# Patient Record
Sex: Female | Born: 1960
Health system: Southern US, Community
[De-identification: ages and names within clinical notes are randomized; demographics above are authoritative.]

## PROBLEM LIST (undated history)

## (undated) DIAGNOSIS — F419 Anxiety disorder, unspecified: Secondary | ICD-10-CM

## (undated) DIAGNOSIS — T7840XA Allergy, unspecified, initial encounter: Secondary | ICD-10-CM

## (undated) DIAGNOSIS — L509 Urticaria, unspecified: Secondary | ICD-10-CM

## (undated) DIAGNOSIS — F329 Major depressive disorder, single episode, unspecified: Secondary | ICD-10-CM

## (undated) DIAGNOSIS — G4730 Sleep apnea, unspecified: Secondary | ICD-10-CM

## (undated) DIAGNOSIS — D219 Benign neoplasm of connective and other soft tissue, unspecified: Secondary | ICD-10-CM

## (undated) DIAGNOSIS — F32A Depression, unspecified: Secondary | ICD-10-CM

## (undated) DIAGNOSIS — I10 Essential (primary) hypertension: Secondary | ICD-10-CM

## (undated) DIAGNOSIS — R011 Cardiac murmur, unspecified: Secondary | ICD-10-CM

## (undated) HISTORY — DX: Major depressive disorder, single episode, unspecified: F32.9

## (undated) HISTORY — DX: Urticaria, unspecified: L50.9

## (undated) HISTORY — DX: Sleep apnea, unspecified: G47.30

## (undated) HISTORY — DX: Allergy, unspecified, initial encounter: T78.40XA

## (undated) HISTORY — DX: Cardiac murmur, unspecified: R01.1

## (undated) HISTORY — DX: Depression, unspecified: F32.A

---

## 1990-05-06 HISTORY — PX: MYOMECTOMY: SHX85

## 1998-05-06 DIAGNOSIS — Z5189 Encounter for other specified aftercare: Secondary | ICD-10-CM

## 1998-05-06 HISTORY — DX: Encounter for other specified aftercare: Z51.89

## 1998-06-21 ENCOUNTER — Ambulatory Visit (HOSPITAL_COMMUNITY): Admission: RE | Admit: 1998-06-21 | Discharge: 1998-06-21 | Payer: Self-pay | Admitting: Obstetrics and Gynecology

## 1998-08-11 ENCOUNTER — Inpatient Hospital Stay (HOSPITAL_COMMUNITY): Admission: AD | Admit: 1998-08-11 | Discharge: 1998-08-11 | Payer: Self-pay | Admitting: Obstetrics and Gynecology

## 1998-08-15 ENCOUNTER — Inpatient Hospital Stay (HOSPITAL_COMMUNITY): Admission: AD | Admit: 1998-08-15 | Discharge: 1998-08-15 | Payer: Self-pay | Admitting: Obstetrics and Gynecology

## 1998-08-18 ENCOUNTER — Inpatient Hospital Stay (HOSPITAL_COMMUNITY): Admission: AD | Admit: 1998-08-18 | Discharge: 1998-08-18 | Payer: Self-pay | Admitting: *Deleted

## 1998-08-28 ENCOUNTER — Inpatient Hospital Stay (HOSPITAL_COMMUNITY): Admission: AD | Admit: 1998-08-28 | Discharge: 1998-08-28 | Payer: Self-pay | Admitting: Obstetrics & Gynecology

## 1998-09-12 ENCOUNTER — Inpatient Hospital Stay (HOSPITAL_COMMUNITY): Admission: AD | Admit: 1998-09-12 | Discharge: 1998-09-12 | Payer: Self-pay | Admitting: Obstetrics and Gynecology

## 1998-10-04 ENCOUNTER — Inpatient Hospital Stay (HOSPITAL_COMMUNITY): Admission: AD | Admit: 1998-10-04 | Discharge: 1998-10-08 | Payer: Self-pay | Admitting: Obstetrics and Gynecology

## 1998-12-05 ENCOUNTER — Ambulatory Visit (HOSPITAL_COMMUNITY): Admission: RE | Admit: 1998-12-05 | Discharge: 1998-12-05 | Payer: Self-pay | Admitting: Obstetrics and Gynecology

## 1998-12-05 ENCOUNTER — Encounter: Payer: Self-pay | Admitting: Obstetrics and Gynecology

## 1999-01-16 ENCOUNTER — Other Ambulatory Visit: Admission: RE | Admit: 1999-01-16 | Discharge: 1999-01-16 | Payer: Self-pay | Admitting: Obstetrics and Gynecology

## 1999-05-11 ENCOUNTER — Ambulatory Visit (HOSPITAL_COMMUNITY): Admission: RE | Admit: 1999-05-11 | Discharge: 1999-05-11 | Payer: Self-pay | Admitting: Obstetrics and Gynecology

## 1999-07-18 ENCOUNTER — Encounter: Payer: Self-pay | Admitting: Obstetrics and Gynecology

## 1999-07-18 ENCOUNTER — Encounter: Admission: RE | Admit: 1999-07-18 | Discharge: 1999-07-18 | Payer: Self-pay | Admitting: Obstetrics and Gynecology

## 2000-04-07 ENCOUNTER — Other Ambulatory Visit: Admission: RE | Admit: 2000-04-07 | Discharge: 2000-04-07 | Payer: Self-pay | Admitting: Obstetrics and Gynecology

## 2000-10-20 ENCOUNTER — Other Ambulatory Visit: Admission: RE | Admit: 2000-10-20 | Discharge: 2000-10-20 | Payer: Self-pay | Admitting: Obstetrics and Gynecology

## 2001-04-16 ENCOUNTER — Other Ambulatory Visit: Admission: RE | Admit: 2001-04-16 | Discharge: 2001-04-16 | Payer: Self-pay | Admitting: Obstetrics and Gynecology

## 2002-05-09 ENCOUNTER — Inpatient Hospital Stay (HOSPITAL_COMMUNITY): Admission: EM | Admit: 2002-05-09 | Discharge: 2002-05-15 | Payer: Self-pay | Admitting: Emergency Medicine

## 2002-05-09 ENCOUNTER — Encounter: Payer: Self-pay | Admitting: Emergency Medicine

## 2002-05-14 ENCOUNTER — Encounter: Payer: Self-pay | Admitting: Internal Medicine

## 2002-06-03 ENCOUNTER — Encounter: Admission: RE | Admit: 2002-06-03 | Discharge: 2002-06-03 | Payer: Self-pay | Admitting: Internal Medicine

## 2002-06-03 ENCOUNTER — Encounter: Payer: Self-pay | Admitting: Internal Medicine

## 2002-10-20 ENCOUNTER — Other Ambulatory Visit: Admission: RE | Admit: 2002-10-20 | Discharge: 2002-10-20 | Payer: Self-pay | Admitting: Obstetrics and Gynecology

## 2003-12-19 ENCOUNTER — Other Ambulatory Visit: Admission: RE | Admit: 2003-12-19 | Discharge: 2003-12-19 | Payer: Self-pay | Admitting: Obstetrics and Gynecology

## 2004-12-25 ENCOUNTER — Other Ambulatory Visit: Admission: RE | Admit: 2004-12-25 | Discharge: 2004-12-25 | Payer: Self-pay | Admitting: Obstetrics and Gynecology

## 2006-10-12 ENCOUNTER — Encounter: Admission: RE | Admit: 2006-10-12 | Discharge: 2006-10-12 | Payer: Self-pay | Admitting: Orthopedic Surgery

## 2007-06-24 ENCOUNTER — Encounter: Admission: RE | Admit: 2007-06-24 | Discharge: 2007-06-24 | Payer: Self-pay | Admitting: Internal Medicine

## 2007-07-04 ENCOUNTER — Emergency Department (HOSPITAL_COMMUNITY): Admission: EM | Admit: 2007-07-04 | Discharge: 2007-07-04 | Payer: Self-pay | Admitting: Emergency Medicine

## 2007-11-16 ENCOUNTER — Encounter: Admission: RE | Admit: 2007-11-16 | Discharge: 2007-11-16 | Payer: Self-pay | Admitting: Internal Medicine

## 2008-08-05 ENCOUNTER — Encounter: Admission: RE | Admit: 2008-08-05 | Discharge: 2008-08-05 | Payer: Self-pay | Admitting: Family Medicine

## 2010-05-26 ENCOUNTER — Encounter: Payer: Self-pay | Admitting: Family Medicine

## 2010-05-27 ENCOUNTER — Encounter: Payer: Self-pay | Admitting: Internal Medicine

## 2010-05-27 ENCOUNTER — Encounter: Payer: Self-pay | Admitting: Obstetrics and Gynecology

## 2010-09-21 NOTE — Discharge Summary (Signed)
   NAME:  Ariana Cohen, Ariana Cohen                         ACCOUNT NO.:  [id number]   MEDICAL RECORD NO.:  [id number]                   PATIENT TYPE:  INP   LOCATION:  5737                                 FACILITY:  [facility]   PHYSICIAN:  Merlene Laughter. Renae Gloss, M.D.           DATE OF BIRTH:  [birth year]   DATE OF ADMISSION:  05/08/2002  DATE OF DISCHARGE:  05/15/2002                                 DISCHARGE SUMMARY   DISCHARGE DIAGNOSES:  1. Right lower lobe pneumonia.  2. Hypokalemia.  3. Hypertension.   HISTORY OF PRESENT ILLNESS:  The patient was admitted with significant  shortness of breath and productive cough.  She failed several rounds of  outpatient antibiotic therapies.  Her initial chest x-ray showed a right  lower lobe consolidation with bronchitic changes.  She was also noted to  have a white count of 40.4.   HOSPITAL COURSE:  She was placed on IV Rocephin and Zithromax along with  albuterol and Atrovent nebulizers.  She was also given chest physiotherapy.  During the course of this admission, her respiratory status improved.  She  continued, however, to be significantly tachypneic until close to the end of  her hospital course.  She was dyspneic with exertion, but had O2 saturations  on room air in the high 90s.   Other problems during this admission included hypokalemia.  She was given  potassium supplementation and her discharge potassium is 3.9.   DISCHARGE MEDICATIONS:  1. Albuterol nebulizer q.4h.  2. Norvasc 10 mg p.o. daily.  3. Atrovent nebulizer q.4h.  4. Protonix 40 mg p.o. daily.  5. Zithromax 500 mg p.o. daily.  6. Humibid LA one p.o. b.i.d.  7. Potassium 40 mEq p.o. daily.  8. Ceftin 500 mg p.o. b.i.d.  9. Tussionex one teaspoon p.o. q.12h. p.r.n. for cough.  10.      Tylenol p.r.n.   FOLLOW-UP:  The patient will be seen by Merlene Laughter. Renae Gloss, M.D., on  Friday, May 21, 2002, at 10 a.m. for follow-up assessment of right lower  lobe pneumonia.                                          Merlene Laughter. Renae Gloss, M.D.    KRS/MEDQ  D:  05/15/2002  T:  05/16/2002  Job:  [id number]

## 2010-09-21 NOTE — Op Note (Signed)
[facility]  Patient:    Ariana Cohen                       MRN: [id number] Proc. Date: 05/11/99 Adm. Date:  [id number] Attending:  Dierdre Forth Pearline                           Operative Report  PREOPERATIVE DIAGNOSIS:       Desire for surgical sterilization, status post left tubal ligation at cesarean section with inability to access the right fallopian  tube.  POSTOPERATIVE DIAGNOSIS:      Desire for surgical sterilization, status post left tubal ligation at cesarean section with inability to access the right fallopian  tube. Plus significant pelvic adhesions.  OPERATION:                    Operative laparoscopy, lysis of adhesions, cauterization of the right fallopian tube for sterilization.  SURGEON:                      Vanessa P. Haygood, M.D.  ASSISTANT:  ANESTHESIA:                   General orotracheal.  ESTIMATED BLOOD LOSS:         Less than 25 cc.  COMPLICATIONS:                None.  FINDINGS:                     The uterus was upper limits of normal size with a 2 cm myoma on the left posterior fundus.  The left fallopian tube was status post  interuption for sterilization.  The left ovary was normal size, but encased in filmy adhesions.  The right fallopian tube was encased in adhesions and adherent to the right ovary which was likewise encased in filmy adhesions.  The fimbriated nd could be identified as could the end joining the uterus.  There were filmy adhesions from the anterior uterus to the anterior abdominal wall.  DESCRIPTION OF PROCEDURE:     The patient was taken to the operating room after  appropriate identification and placed on the operating table.  After the attainment of adequate general anesthesia, she was placed in the modified lithotomy position. The abdomen, perineum, and vagina were prepped with multiple layers of Betadine and draped as a sterile field.  A red Robinson catheter was used  to empty the bladder. A single tooth tenaculum was placed on the anterior cervix.  The periumbilical rea was infiltrated with 6 cc of 0.25% Marcaine and the suprapubic regions on the right and left side of the midline were infiltrated with 2 cc of 0.25% Marcaine on either side.  A periumbilical incision was made and the Veress cannula placed through hat incision into the peritoneal cavity.  The pneumoperitoneum was created with 3.5  liters of CO2.  The Veress cannula was removed and the laparoscopic trocar placed through that incision.  The laparoscope was placed through the trocar sleeve and the above noted findings made.  The suprapubic incision was made to the left of  midline and the laparoscopic probe trocar placed through that incision into the  peritoneal cavity under direct visualization.  A combination of blunt and sharp  dissection was used to free the right fallopian tube from its encasement in adhesions and  to free it in the isthmic portion to allow cautery.  Hemostasis was achieved with cautery.  The isthmic portion of the fallopian tube was then identified and its connection with the fundal portion as well as the fimbriated end documented.  That isthmic portion was elevated and cauterized in three adjacent  areas and then cut.  Hemostasis was noted to be adequate and copious irrigation was carried out.  All instruments were removed from the peritoneal cavity under direct visualization as the CO2 was allowed to escape.  The subumbilical and suprapubic incisions were closed with subcuticular sutures of 3-0 Vicryl.  Sterile dressings were applied and the single tooth tenaculum removed.  The patient was awakened rom general anesthesia and taken to the recovery room in satisfactory condition having tolerated the procedure well with sponge, needle, and instrument counts were correct. DD:  05/11/99 TD:  05/12/99 Job: [id number]

## 2013-06-04 ENCOUNTER — Other Ambulatory Visit: Payer: Self-pay | Admitting: Obstetrics and Gynecology

## 2013-06-04 DIAGNOSIS — Z1231 Encounter for screening mammogram for malignant neoplasm of breast: Secondary | ICD-10-CM

## 2013-06-15 ENCOUNTER — Other Ambulatory Visit: Payer: Self-pay | Admitting: Obstetrics and Gynecology

## 2013-06-15 DIAGNOSIS — D259 Leiomyoma of uterus, unspecified: Secondary | ICD-10-CM

## 2013-06-22 ENCOUNTER — Inpatient Hospital Stay
Admission: RE | Admit: 2013-06-22 | Discharge: 2013-06-22 | Disposition: A | Discharge status: Home / Self Care | Payer: Self-pay | Source: Ambulatory Visit | Attending: Obstetrics and Gynecology | Admitting: Obstetrics and Gynecology

## 2013-06-25 ENCOUNTER — Ambulatory Visit
Admission: RE | Admit: 2013-06-25 | Discharge: 2013-06-25 | Disposition: A | Discharge status: Home / Self Care | Payer: BC Managed Care – PPO | Source: Ambulatory Visit | Attending: Obstetrics and Gynecology | Admitting: Obstetrics and Gynecology

## 2013-06-25 DIAGNOSIS — Z1231 Encounter for screening mammogram for malignant neoplasm of breast: Secondary | ICD-10-CM

## 2013-07-08 ENCOUNTER — Ambulatory Visit
Admission: RE | Admit: 2013-07-08 | Discharge: 2013-07-08 | Disposition: A | Discharge status: Home / Self Care | Payer: BC Managed Care – PPO | Source: Ambulatory Visit | Attending: Obstetrics and Gynecology | Admitting: Obstetrics and Gynecology

## 2013-07-08 DIAGNOSIS — D259 Leiomyoma of uterus, unspecified: Secondary | ICD-10-CM

## 2013-07-08 HISTORY — DX: Benign neoplasm of connective and other soft tissue, unspecified: D21.9

## 2013-07-08 HISTORY — DX: Essential (primary) hypertension: I10

## 2013-09-17 ENCOUNTER — Telehealth: Payer: Self-pay | Admitting: Radiology

## 2013-09-17 NOTE — Telephone Encounter (Signed)
Patient called to request app't for pre [place] MR.  Will obtain order from Dr Leo Grosser & call back to schedule app't.  Sian Rockers Riki Rusk, RN 09/17/2013 2:42 PM

## 2013-09-29 ENCOUNTER — Other Ambulatory Visit: Payer: Self-pay | Admitting: Obstetrics and Gynecology

## 2013-09-29 DIAGNOSIS — N920 Excessive and frequent menstruation with regular cycle: Secondary | ICD-10-CM

## 2013-09-29 DIAGNOSIS — D259 Leiomyoma of uterus, unspecified: Secondary | ICD-10-CM

## 2013-10-14 ENCOUNTER — Ambulatory Visit
Admission: RE | Admit: 2013-10-14 | Discharge: 2013-10-14 | Disposition: A | Discharge status: Home / Self Care | Payer: BC Managed Care – PPO | Source: Ambulatory Visit | Attending: Obstetrics and Gynecology | Admitting: Obstetrics and Gynecology

## 2013-10-14 DIAGNOSIS — D259 Leiomyoma of uterus, unspecified: Secondary | ICD-10-CM

## 2013-10-14 DIAGNOSIS — N920 Excessive and frequent menstruation with regular cycle: Secondary | ICD-10-CM

## 2013-10-14 MED ORDER — GADOBENATE DIMEGLUMINE 529 MG/ML IV SOLN
19.0000 mL | Freq: Once | INTRAVENOUS | Status: AC | PRN
  Administered 2013-10-14: 19 mL via INTRAVENOUS

## 2013-10-15 ENCOUNTER — Telehealth: Payer: Self-pay | Admitting: Emergency Medicine

## 2013-10-15 NOTE — Telephone Encounter (Signed)
CALLED PT TO MAKE HER AWARE THAT DR DDH REVIEWED HER MRI AND SHE IS STILL A GOOD CANDIDATE FOR [place].  I WILL GO AHEAD AND FILE W/ INS. WHILE PT WILL CONTACT THEM FOR BENEFITS AND ELIGIBILITY AND PRICE. WILL CALL PT BACK AFTER WE HEAR FROM [employer]. AUTHO.

## 2013-10-25 ENCOUNTER — Telehealth: Payer: Self-pay | Admitting: Emergency Medicine

## 2013-10-25 NOTE — Telephone Encounter (Signed)
LMOVM FOR PT TO MAKE HER AWARE THAT NO INS. AUTHO NEEDED FOR [place] PROCEDURE.  JUST CALL us WHEN READY TO SCHEDULE PROCEDURE.  DO HAVE REF. # [id number] FOR CKING ON CPT CODES FOR [place] PROCEDURE.

## 2015-06-02 ENCOUNTER — Other Ambulatory Visit: Payer: Self-pay | Admitting: Internal Medicine

## 2015-06-02 DIAGNOSIS — Z1231 Encounter for screening mammogram for malignant neoplasm of breast: Secondary | ICD-10-CM

## 2015-06-28 ENCOUNTER — Ambulatory Visit: Payer: Self-pay

## 2015-08-08 ENCOUNTER — Ambulatory Visit: Payer: Self-pay | Admitting: Internal Medicine

## 2015-08-11 ENCOUNTER — Encounter: Payer: Self-pay | Admitting: Internal Medicine

## 2015-08-11 ENCOUNTER — Ambulatory Visit (INDEPENDENT_AMBULATORY_CARE_PROVIDER_SITE_OTHER): Payer: 59 | Admitting: Internal Medicine

## 2015-08-11 VITALS — BP 170/90 | HR 72 | Ht 66.0 in | Wt 212.2 lb

## 2015-08-11 DIAGNOSIS — R011 Cardiac murmur, unspecified: Secondary | ICD-10-CM | POA: Diagnosis not present

## 2015-08-11 DIAGNOSIS — R0600 Dyspnea, unspecified: Secondary | ICD-10-CM

## 2015-08-11 DIAGNOSIS — R0609 Other forms of dyspnea: Secondary | ICD-10-CM | POA: Diagnosis not present

## 2015-08-11 DIAGNOSIS — R0602 Shortness of breath: Secondary | ICD-10-CM | POA: Insufficient documentation

## 2015-08-11 DIAGNOSIS — R5383 Other fatigue: Secondary | ICD-10-CM | POA: Diagnosis not present

## 2015-08-11 DIAGNOSIS — G4719 Other hypersomnia: Secondary | ICD-10-CM

## 2015-08-11 DIAGNOSIS — I10 Essential (primary) hypertension: Secondary | ICD-10-CM

## 2015-08-11 DIAGNOSIS — I493 Ventricular premature depolarization: Secondary | ICD-10-CM | POA: Diagnosis not present

## 2015-08-11 DIAGNOSIS — Z79899 Other long term (current) drug therapy: Secondary | ICD-10-CM

## 2015-08-11 DIAGNOSIS — R0683 Snoring: Secondary | ICD-10-CM

## 2015-08-11 NOTE — Patient Instructions (Addendum)
Medication Instructions:   TAKE lasix in the evening before you go to work TAKE hydralazine 50mg  twice daily  Labwork:  Lab work in the morning - first floor of Dr. Lysbeth Penner building in [address]  Testing/Procedures:  Your physician has requested that you have an echocardiogram @ [address] - 3rd Floor. Echocardiography is a painless test that uses sound waves to create images of your heart. It provides your doctor with information about the size and shape of your heart and how well your heart's chambers and valves are working. This procedure takes approximately one hour. There are no restrictions for this procedure.  Your physician has requested that you have an exercise stress myoview. For further information please visit [email or web address]. Please follow instruction sheet, as given.  Your physician has recommended that you have a sleep study @ [facility] [employer]. This test records several body functions during sleep, including: brain activity, eye movement, oxygen and carbon dioxide blood levels, heart rate and rhythm, breathing rate and rhythm, the flow of air through your mouth and nose, snoring, body muscle movements, and chest and belly movement.  Follow-Up:  Your physician recommends that you schedule a follow-up appointment after your testing.    Any Other Special Instructions Will Be Listed Below (If Applicable).

## 2015-08-11 NOTE — Progress Notes (Addendum)
OFFICE NOTE  Chief Complaint:  Elevated blood pressure, tired all of the time  Primary Care Physician: Gara Kroner, MD  HPI:  Ariana Cohen is a 55 y.o. female with a history of hypertension, leg edema, hypokalemia (possibly related to Lasix) and excessive daytime sleepiness and fatigue. She's had a number of years it difficult to control hypertension. She's been followed by several primary care providers and recently saw Marijo File, who referred her to [place] for further evaluation. Over the years she's required a number of medications for blood pressure control. Recently she was on Norvasc which she said was helpful although had significant lower extremity swelling. Subsequently she had been put on clonidine which she said made her very sleepy in addition to her normal level of sleepiness. She is also on metoprolol and was placed on Diovan. She takes hydralazine as well. She was prescribed 50 mg 3 times a day however she only takes 100 mg daily. She works third shift and typically when she gets home sleeps for about 4 hours. She says she takes Lasix when she gets home and that often interrupts her sleep to get up to urinate. Recently she's required potassium based on laboratory work which showed some degree of hypokalemia. She generally takes the metoprolol also in the morning when she gets home from work before going to sleep as well as Diovan. She says that she does have difficulty sleeping when she tries to go to sleep. She is waking up several times either to urinate or for other reasons. Then she feels sleepy throughout the day. She oftentimes dozes off. She's noted to snore and denies any gasping or witnessed sleep apnea. Her sleepiness score today was quite high at 18. There is a strong family history of hypertension and her family both her mother and father and both grandparents. She also has a 78 year old sister with hypertension. She denies any chest pain but does get short of breath  with exertion. Her EKG today shows PVCs for which she says she is asymptomatic. There is also evidence of LVH and nonspecific T-wave changes. She last had an echocardiogram in 2009[facility] which showed normal LVEF of 55-60% and mild mitral regurgitation with no evidence of mitral valve prolapse. She does have a mitral murmur.  PMHx:  Past Medical History  Diagnosis Date  . Hypertension   . Fibroids     Past Surgical History  Procedure Laterality Date  . Myomectomy  1992  . Cesarean section  1998; 2000    FAMHx:  Family History  Problem Relation Age of Onset  . Hypertension Mother   . Renal Disease Mother   . Hypertension Father   . Heart disease Father   . Hypertension Maternal Grandmother   . Hypertension Maternal Grandfather     SOCHx:   reports that she has never smoked. She has never used smokeless tobacco. She reports that she does not drink alcohol. Her drug history is not on file.  ALLERGIES:  Allergies  Allergen Reactions  . Norvasc [Amlodipine Besylate] Swelling  . Aspirin Hives    ROS: Pertinent items noted in HPI and remainder of comprehensive ROS otherwise negative.  HOME MEDS: Current Outpatient Prescriptions  Medication Sig Dispense Refill  . furosemide (LASIX) 40 MG tablet Take 40 mg by mouth every evening.   0  . hydrALAZINE (APRESOLINE) 50 MG tablet Take 50 mg by mouth 2 (two) times daily.   11  . KLOR-CON M20 20 MEQ  tablet Take 2 tablets by mouth daily.    . metoprolol succinate (TOPROL-XL) 50 MG 24 hr tablet Take 50 mg by mouth daily.  11  . valsartan (DIOVAN) 320 MG tablet Take 1 tablet by mouth daily.     No current facility-administered medications for this visit.    LABS/IMAGING: No results found for this or any previous visit (from the past 48 hour(s)). No results found.  WEIGHTS: Wt Readings from Last 3 Encounters:  08/11/15 212 lb 3.2 oz (96.253 kg)  07/08/13 202 lb (91.627 kg)    VITALS: BP 170/90  mmHg  Pulse 72  Ht 5\' 6"  (1.676 m)  Wt 212 lb 3.2 oz (96.253 kg)  BMI 34.27 kg/m2  EXAM: General appearance: alert, no distress and moderately obese Neck: no carotid bruit and no JVD Lungs: clear to auscultation bilaterally Heart: regular rate and rhythm, S1, S2 normal and systolic murmur: early systolic 3/6, blowing at apex Abdomen: soft, non-tender; bowel sounds normal; no masses,  no organomegaly Extremities: Trace pedal edema Pulses: 2+ and symmetric Skin: Skin color, texture, turgor normal. No rashes or lesions Neurologic: Grossly normal Psych: Pleasant  EKG: Sinus rhythm with occasional PVCs at 72, possible left atrial enlargement, LVH by voltage, nonspecific T wave changes, mildly prolonged QTC of 477 ms  ASSESSMENT: 1. Uncontrolled hypertension 2. DOE 3. Murmur 4. PVCs 5. Significant daytime sleepiness with concern for sleep apnea  PLAN: 1.   [name] is significantly fatigued which may be a combination of sleep apnea, poor sleep hygiene and shift work disorder. She has uncontrolled hypertension which could be secondary to her sleepiness. Blood pressure today was elevated however she did not take her medications. She recently ran out of her valsartan and. Generally she says she is compliant however she is only taking the hydralazine 100 mg once a day. I advised her to try to take the 50 mg tablet twice a day and explained how that medicine works. In addition she should continue on metoprolol and valsartan. It would be better if she took that in the evening before she went to work when she was more active. Of also advised her to take her Lasix at that time because it's more likely to keep her up if she takes it in the morning before she goes to bed (she sleeps during the day). I would like to recheck lab work to reevaluate her hypokalemia. She says she's not started taking potassium yet. She is on 40 mg of Lasix. We'll also check renin and aldosterone ratios to rule out  hyperaldosteronism. She has PVCs on her EKG and I like to check an echocardiogram to evaluate murmur and for structural abnormalities may cause her PVCs. Finally with her shortness of breath on exertion and the interested in starting new exercise along with her PVCs I like her to have an exercise treadmill stress Myoview. Plan to see her back to discuss the results of the studies and review home blood pressure measurements. We will further titrate adjust medications based on those findings.  Thanks for the kind referral.  Pixie Casino, MD, [facility] Attending Cardiologist [place] 08/11/2015, 11:59 AM

## 2015-08-14 ENCOUNTER — Other Ambulatory Visit (HOSPITAL_COMMUNITY): Payer: 59

## 2015-08-25 ENCOUNTER — Telehealth (HOSPITAL_COMMUNITY): Payer: Self-pay

## 2015-08-25 NOTE — Telephone Encounter (Signed)
Encounter complete. 

## 2015-08-30 ENCOUNTER — Ambulatory Visit (HOSPITAL_COMMUNITY)
Admission: RE | Admit: 2015-08-30 | Discharge: 2015-08-30 | Disposition: A | Discharge status: Home / Self Care | Payer: 59 | Source: Ambulatory Visit | Attending: Cardiology | Admitting: Cardiology

## 2015-08-30 DIAGNOSIS — Z6834 Body mass index (BMI) 34.0-34.9, adult: Secondary | ICD-10-CM | POA: Insufficient documentation

## 2015-08-30 DIAGNOSIS — E669 Obesity, unspecified: Secondary | ICD-10-CM | POA: Diagnosis not present

## 2015-08-30 DIAGNOSIS — I493 Ventricular premature depolarization: Secondary | ICD-10-CM | POA: Insufficient documentation

## 2015-08-30 DIAGNOSIS — R011 Cardiac murmur, unspecified: Secondary | ICD-10-CM | POA: Insufficient documentation

## 2015-08-30 DIAGNOSIS — Z8249 Family history of ischemic heart disease and other diseases of the circulatory system: Secondary | ICD-10-CM | POA: Insufficient documentation

## 2015-08-30 DIAGNOSIS — R0609 Other forms of dyspnea: Secondary | ICD-10-CM | POA: Diagnosis not present

## 2015-08-30 DIAGNOSIS — I10 Essential (primary) hypertension: Secondary | ICD-10-CM | POA: Diagnosis not present

## 2015-08-30 DIAGNOSIS — R0600 Dyspnea, unspecified: Secondary | ICD-10-CM

## 2015-08-30 DIAGNOSIS — R5383 Other fatigue: Secondary | ICD-10-CM | POA: Diagnosis not present

## 2015-08-30 LAB — MYOCARDIAL PERFUSION IMAGING
LV dias vol: 151 mL (ref 46–106)
LV sys vol: 83 mL
Peak HR: 88 {beats}/min
Rest HR: 66 {beats}/min
SDS: 2
SRS: 1
SSS: 3
TID: 1.07

## 2015-08-30 MED ORDER — REGADENOSON 0.4 MG/5ML IV SOLN
0.4000 mg | Freq: Once | INTRAVENOUS | Status: AC
  Administered 2015-08-30: 0.4 mg via INTRAVENOUS

## 2015-08-30 MED ORDER — TECHNETIUM TC 99M SESTAMIBI GENERIC - CARDIOLITE
10.1000 | Freq: Once | INTRAVENOUS | Status: AC | PRN
  Administered 2015-08-30: 10.1 via INTRAVENOUS

## 2015-08-30 MED ORDER — TECHNETIUM TC 99M SESTAMIBI GENERIC - CARDIOLITE
29.6000 | Freq: Once | INTRAVENOUS | Status: AC | PRN
  Administered 2015-08-30: 29.6 via INTRAVENOUS

## 2015-08-30 MED ORDER — AMINOPHYLLINE 25 MG/ML IV SOLN
75.0000 mg | Freq: Once | INTRAVENOUS | Status: AC
  Administered 2015-08-30: 75 mg via INTRAVENOUS

## 2015-09-04 ENCOUNTER — Telehealth: Payer: Self-pay | Admitting: Internal Medicine

## 2015-09-04 NOTE — Telephone Encounter (Signed)
Patient called with results. She is aware she needs to reschedule her echo and that someone from our scheduling team will call her to set this up.

## 2015-09-04 NOTE — Telephone Encounter (Signed)
Pt is calling you back in regards to stress test result. Please f/u with her   Thanks

## 2015-09-05 ENCOUNTER — Other Ambulatory Visit: Payer: Self-pay | Admitting: Internal Medicine

## 2015-09-05 DIAGNOSIS — R011 Cardiac murmur, unspecified: Secondary | ICD-10-CM

## 2015-09-08 ENCOUNTER — Ambulatory Visit (HOSPITAL_COMMUNITY)
Admission: RE | Admit: 2015-09-08 | Discharge: 2015-09-08 | Disposition: A | Discharge status: Home / Self Care | Payer: 59 | Source: Ambulatory Visit | Attending: Cardiovascular Disease | Admitting: Cardiovascular Disease

## 2015-09-08 DIAGNOSIS — I340 Nonrheumatic mitral (valve) insufficiency: Secondary | ICD-10-CM | POA: Insufficient documentation

## 2015-09-08 DIAGNOSIS — R011 Cardiac murmur, unspecified: Secondary | ICD-10-CM | POA: Diagnosis present

## 2015-09-08 DIAGNOSIS — I071 Rheumatic tricuspid insufficiency: Secondary | ICD-10-CM | POA: Diagnosis not present

## 2015-09-08 DIAGNOSIS — I119 Hypertensive heart disease without heart failure: Secondary | ICD-10-CM | POA: Insufficient documentation

## 2015-09-12 ENCOUNTER — Ambulatory Visit (INDEPENDENT_AMBULATORY_CARE_PROVIDER_SITE_OTHER): Payer: 59 | Admitting: Internal Medicine

## 2015-09-12 ENCOUNTER — Encounter: Payer: Self-pay | Admitting: Internal Medicine

## 2015-09-12 VITALS — BP 180/100 | HR 72 | Ht 66.0 in | Wt 213.6 lb

## 2015-09-12 DIAGNOSIS — Z79899 Other long term (current) drug therapy: Secondary | ICD-10-CM

## 2015-09-12 DIAGNOSIS — I10 Essential (primary) hypertension: Secondary | ICD-10-CM

## 2015-09-12 DIAGNOSIS — R0600 Dyspnea, unspecified: Secondary | ICD-10-CM

## 2015-09-12 DIAGNOSIS — R0602 Shortness of breath: Secondary | ICD-10-CM | POA: Diagnosis not present

## 2015-09-12 DIAGNOSIS — R011 Cardiac murmur, unspecified: Secondary | ICD-10-CM | POA: Diagnosis not present

## 2015-09-12 DIAGNOSIS — R0609 Other forms of dyspnea: Secondary | ICD-10-CM

## 2015-09-12 DIAGNOSIS — I493 Ventricular premature depolarization: Secondary | ICD-10-CM

## 2015-09-12 MED ORDER — NEBIVOLOL HCL 10 MG PO TABS: 10.0000 mg | ORAL_TABLET | Freq: Every day | ORAL | Status: DC

## 2015-09-12 MED ORDER — KLOR-CON M20 20 MEQ PO TBCR: 60.0000 meq | EXTENDED_RELEASE_TABLET | Freq: Every day | ORAL | Status: DC

## 2015-09-12 MED ORDER — FUROSEMIDE 40 MG PO TABS: 60.0000 mg | ORAL_TABLET | Freq: Every evening | ORAL | Status: DC

## 2015-09-12 NOTE — Patient Instructions (Addendum)
Your physician recommends that you return for lab work Friday May 12 or Monday May 15  Your physician has recommended you make the following change in your medication:  -- INCREASE lasix to 60mg  once daily -- INCREASE potassium to [id number] -- START bystolic 10mg  once daily (5 bottles of samples and co-pay card provided) -- STOP metoprolol   Your physician recommends that you schedule a follow-up appointment in: TWO WEEKS with Erasmo Downer, clinical pharmacist for a blood pressure check   Your physician recommends that you schedule a follow-up appointment in: [place] with Dr. Debara Pickett

## 2015-09-12 NOTE — Progress Notes (Signed)
OFFICE NOTE  Chief Complaint:  Elevated blood pressure, follow-up studies  Primary Care Physician: Gara Kroner, MD  HPI:  Ariana Cohen is a 55 y.o. female with a history of hypertension, leg edema, hypokalemia (possibly related to Lasix) and excessive daytime sleepiness and fatigue. She's had a number of years it difficult to control hypertension. She's been followed by several primary care providers and recently saw Marijo File, who referred her to [place] for further evaluation. Over the years she's required a number of medications for blood pressure control. Recently she was on Norvasc which she said was helpful although had significant lower extremity swelling. Subsequently she had been put on clonidine which she said made her very sleepy in addition to her normal level of sleepiness. She is also on metoprolol and was placed on Diovan. She takes hydralazine as well. She was prescribed 50 mg 3 times a day however she only takes 100 mg daily. She works third shift and typically when she gets home sleeps for about 4 hours. She says she takes Lasix when she gets home and that often interrupts her sleep to get up to urinate. Recently she's required potassium based on laboratory work which showed some degree of hypokalemia. She generally takes the metoprolol also in the morning when she gets home from work before going to sleep as well as Diovan. She says that she does have difficulty sleeping when she tries to go to sleep. She is waking up several times either to urinate or for other reasons. Then she feels sleepy throughout the day. She oftentimes dozes off. She's noted to snore and denies any gasping or witnessed sleep apnea. Her sleepiness score today was quite high at 18. There is a strong family history of hypertension and her family both her mother and father and both grandparents. She also has a 72 year old sister with hypertension. She denies any chest pain but does get short of breath with  exertion. Her EKG today shows PVCs for which she says she is asymptomatic. There is also evidence of LVH and nonspecific T-wave changes. She last had an echocardiogram in 2009[facility] which showed normal LVEF of 55-60% and mild mitral regurgitation with no evidence of mitral valve prolapse. She does have a mitral murmur.  09/12/2015  Mr. Szwed returns today for follow-up. She reports continued hypertension despite changes in her medications. She is currently on hydralazine 50 mg twice a day, Toprol-XL 50 mg daily and valsartan 320 mg daily. Blood pressure today was 180/100. She's complaining of persistent swelling of her legs. She gets short of breath with activity. Her nuclear stress test was negative for ischemia however demonstrated a low EF of 45%. Echo however demonstrated normal systolic function therefore I suspect a gating abnormality. There was however stage II diastolic dysfunction and mild mitral regurgitation which is evidenced by her murmur. She does have a history of hypokalemia which could be related to Lasix or one must consider a more rare causes of hypertension such as hyperaldosteronism. There is however strong family history of hypertension. I also think she has significant fatigue and would benefit from a sleep study. This is scheduled on 10/09/2015.  PMHx:  Past Medical History  Diagnosis Date  . Hypertension   . Fibroids     Past Surgical History  Procedure Laterality Date  . Myomectomy  1992  . Cesarean section  1998; 2000    FAMHx:  Family History  Problem Relation Age of Onset  .  Hypertension Mother   . Renal Disease Mother   . Hypertension Father   . Heart disease Father     also OSA  . Hypertension Maternal Grandmother   . Heart attack Maternal Grandmother   . Thyroid disease Maternal Grandmother     thyroidectomy  . Hypertension Maternal Grandfather   . Multiple myeloma Maternal Grandfather   . Sleep apnea Maternal Grandfather    . Hypertension Sister     SOCHx:   reports that she has never smoked. She has never used smokeless tobacco. She reports that she does not drink alcohol. Her drug history is not on file.  ALLERGIES:  Allergies  Allergen Reactions  . Norvasc [Amlodipine Besylate] Swelling  . Aspirin Hives    ROS: Pertinent items noted in HPI and remainder of comprehensive ROS otherwise negative.  HOME MEDS: Current Outpatient Prescriptions  Medication Sig Dispense Refill  . furosemide (LASIX) 40 MG tablet Take 40 mg by mouth every evening.   0  . hydrALAZINE (APRESOLINE) 50 MG tablet Take 50 mg by mouth 2 (two) times daily.   11  . KLOR-CON M20 20 MEQ tablet Take 2 tablets by mouth daily.    . valsartan (DIOVAN) 320 MG tablet Take 1 tablet by mouth daily.     No current facility-administered medications for this visit.    LABS/IMAGING: No results found for this or any previous visit (from the past 48 hour(s)). No results found.  WEIGHTS: Wt Readings from Last 3 Encounters:  09/12/15 213 lb 9.6 oz (96.888 kg)  08/30/15 212 lb (96.163 kg)  08/11/15 212 lb 3.2 oz (96.253 kg)    VITALS: BP 180/100 mmHg  Pulse 72  Ht '5\' 6"'$  (1.676 m)  Wt 213 lb 9.6 oz (96.888 kg)  BMI 34.49 kg/m2  EXAM: General appearance: alert, no distress and moderately obese Neck: no carotid bruit and no JVD Lungs: clear to auscultation bilaterally Heart: regular rate and rhythm, S1, S2 normal and systolic murmur: early systolic 3/6, blowing at apex Abdomen: soft, non-tender; bowel sounds normal; no masses,  no organomegaly Extremities: Trace pedal edema Pulses: 2+ and symmetric Skin: Skin color, texture, turgor normal. No rashes or lesions Neurologic: Grossly normal Psych: Pleasant  EKG: Deferred  ASSESSMENT: 1. Uncontrolled hypertension 2. DOE - normal LV systolic function with grade 2 diastolic dysfunction and mild LVH 3. Murmur 4. PVCs 5. Significant daytime sleepiness with concern for sleep  apnea 6. Leg edema  PLAN: 1.   Mrs. Schoenberg has persistent shortness of breath with exertion which is related likely to grade 2 diastolic dysfunction and uncontrolled hypertension. This is also likely the cause of her lower extremity edema. Will make some medicine changes today. She reports fatigue on Toprol and we'll switch that to Bystolic 10 mg daily. This should get better blood pressure control and have less side effects. We'll continue her valsartan and and increase her Lasix to 60 mg daily. Will also increase potassium to 60 mEq daily. Plan to check metabolic profile as well as a BNP and renin/aldosterone ratio within the next week to evaluate for possible hyperaldosteronism. We will continue her current hydralazine dose of 50 mg twice daily as it's difficult for her to take it 3 times a day. She does have an upcoming sleep study which hopefully if it identifies sleep apnea will be treated. Her stress test is reassuring without reversible ischemia. I would like her to follow-up with our hypertension pharmacist Kristen in 2 weeks for medication adjustments. She was advised  to bring her blood pressure cuff and readings with her. She may need additional medication- if she is found to have hyperaldosteronism then she would benefit from the addition of spironolactone. I would like to avoid amlodipine and she's taken this in the past with significant swelling as a side effect. We will also provide her information on compression stockings today and I've encouraged her to wear those while she is at work.  Follow-up with me in one month.  Thanks for the kind referral.  Pixie Casino, MD, [facility] Attending Cardiologist [place] 09/12/2015, 10:07 AM

## 2015-09-28 ENCOUNTER — Encounter: Payer: 59 | Admitting: Pharmacist Clinician (PhC)/ Clinical Pharmacy Specialist

## 2015-10-09 ENCOUNTER — Encounter (HOSPITAL_BASED_OUTPATIENT_CLINIC_OR_DEPARTMENT_OTHER): Payer: 59

## 2015-10-11 ENCOUNTER — Ambulatory Visit: Payer: 59

## 2015-10-16 ENCOUNTER — Other Ambulatory Visit: Payer: Self-pay | Admitting: Internal Medicine

## 2015-10-16 NOTE — Telephone Encounter (Signed)
Medication Samples have been left at the front desk for pick up by the pt. Message left for pt to pick up when she is able.  Drug name: Bysolic       Strength: 10mg         Qty: 4 bottles (28 tablets)  LOT: [id number]  Exp.Date: 03/2016  Dosing instructions: Take 1 tablet (10mg ) by mouth once a day.    Roney Jaffe 4:48 PM 10/16/2015

## 2015-10-16 NOTE — Telephone Encounter (Signed)
Patient calling the office for samples of medication:   1.  What medication and dosage are you requesting samples for? Bystolic  2.  Are you currently out of this medication? 1 pill left

## 2015-10-18 ENCOUNTER — Ambulatory Visit: Payer: 59 | Admitting: Internal Medicine

## 2015-10-19 ENCOUNTER — Ambulatory Visit (INDEPENDENT_AMBULATORY_CARE_PROVIDER_SITE_OTHER): Payer: Managed Care, Other (non HMO) | Admitting: Pharmacist

## 2015-10-19 VITALS — BP 162/94 | HR 66 | Wt 215.8 lb

## 2015-10-19 DIAGNOSIS — I10 Essential (primary) hypertension: Secondary | ICD-10-CM

## 2015-10-19 LAB — BRAIN NATRIURETIC PEPTIDE: Brain Natriuretic Peptide: 195 pg/mL — ABNORMAL HIGH (ref <100)

## 2015-10-19 MED ORDER — HYDRALAZINE HCL 100 MG PO TABS: 100.0000 mg | ORAL_TABLET | Freq: Two times a day (BID) | ORAL | Status: DC

## 2015-10-19 NOTE — Patient Instructions (Signed)
Return for a a follow up appointment in 1 month with Dr. Debara Pickett. Follow-up in blood pressure clinic in 2 months.   Your blood pressure today is 162/94 (goal <140/90)  Check your blood pressure at home daily (if able) and keep record of the readings.  Take your BP meds as follows: Start taking Hydralazine 100mg  BID  Bring all of your meds, your BP cuff and your record of home blood pressures to your next appointment.  Exercise as you're able, try to walk approximately 30 minutes per day.  Keep salt intake to a minimum, especially watch canned and prepared boxed foods.  Eat more fresh fruits and vegetables and fewer canned items.  Avoid eating in fast food restaurants.    HOW TO TAKE YOUR BLOOD PRESSURE: . Rest 5 minutes before taking your blood pressure. .  Don't smoke or drink caffeinated beverages for at least 30 minutes before. . Take your blood pressure before (not after) you eat. . Sit comfortably with your back supported and both feet on the floor (don't cross your legs). . Elevate your arm to heart level on a table or a desk. . Use the proper sized cuff. It should fit smoothly and snugly around your bare upper arm. There should be enough room to slip a fingertip under the cuff. The bottom edge of the cuff should be 1 inch above the crease of the elbow. . Ideally, take 3 measurements at one sitting and record the average.

## 2015-10-20 LAB — BASIC METABOLIC PANEL
BUN: 13 mg/dL (ref 7–25)
CO2: 23 mmol/L (ref 20–31)
Calcium: 9.2 mg/dL (ref 8.6–10.4)
Chloride: 103 mmol/L (ref 98–110)
Creat: 0.75 mg/dL (ref 0.50–1.05)
Glucose, Bld: 107 mg/dL — ABNORMAL HIGH (ref 65–99)
Potassium: 3.8 mmol/L (ref 3.5–5.3)
Sodium: 141 mmol/L (ref 135–146)

## 2015-10-22 ENCOUNTER — Encounter: Payer: Self-pay | Admitting: Pharmacist

## 2015-10-22 LAB — ALDOSTERONE + RENIN ACTIVITY W/ RATIO
ALDO / PRA Ratio: 24.3 Ratio (ref 0.9–28.9)
Aldosterone: 9 ng/dL
PRA LC/MS/MS: 0.37 ng/mL/h (ref 0.25–5.82)

## 2015-10-22 NOTE — Assessment & Plan Note (Signed)
BP is not at goal. Increase hydralazine to 100mg  BID. Will consider adding spironolactone pending renin/aldosterone results. This should decrease her potassium requirement. Will likely need to cut back on her lasix though if urination becomes a problem. Avoid amlodipine due to swelling. It seems that adherence may also be playing a role in her BP control. Will have her follow-up in hypertension clinic in 2 months.

## 2015-10-22 NOTE — Progress Notes (Signed)
Patient ID: Ariana Cohen                 DOB: February 08, 1961                      MRN: [id number]     HPI: Ariana Cohen is a 55 y.o. female referred by Dr. Debara Pickett to HTN clinic for evaluation. Her cardiac history includes HTN, leg edema, PVCs, diastolic dysfunction and mild mitral regurg. She reports feeling the same since her visit with Dr. Debara Pickett. She states she sometimes tries to take her hydralazine three times a day because she was told it does not last long enough for BID dosing. She also reports she frequently skips her fluid pill because she is going to the bathroom all the time even if she takes it while awake. She states she has edema always but it tends to be more right sided. She also reports taking the potassium only occasionally because the pills are "horse pills."  She is nedding to have a sleep study as well as renin/aldosterone levels drawn; however these have been difficult to schedule due to schedule working 3rd shift. She requests the phone number for the sleep clinic today as she cancelled her previous study.   Current HTN meds:  Furosemide 60mg  daily in the evening Hydralazine 50mg  BID-TID nebivolol 10mg  daily in the morning Valsartan 320mg  daily in the morning Previously tried:  Amlodipine - swelling  BP goal: <  Family History: mother and father had high blood pressure. Father passed from cardiac issues. She also has a sister with hypertension.  Social History: She has never used tobacco products. She drinks wine occasionally.   Diet: She states she eats salads.   Exercise: not currently, but would like to get back in the gym. She has a Higher education careers adviser at [employer].   Home BP readings: She has a cuff but has not been checking because she said it is bad and she is frustrated with the results.   Wt Readings from Last 3 Encounters:  10/22/15 215 lb 12.8 oz (97.886 kg)  09/12/15 213 lb 9.6 oz (96.888 kg)  08/30/15 212 lb (96.163 kg)   BP Readings from Last 3  Encounters:  10/22/15 162/94  09/12/15 180/100  08/11/15 170/90   Pulse Readings from Last 3 Encounters:  10/22/15 66  09/12/15 72  08/11/15 72    Renal function: Estimated Creatinine Clearance: 93.7 mL/min (by C-G formula based on Cr of 0.75).  Past Medical History  Diagnosis Date  . Hypertension   . Fibroids     Current Outpatient Prescriptions on File Prior to Visit  Medication Sig Dispense Refill  . furosemide (LASIX) 40 MG tablet Take 1.5 tablets (60 mg total) by mouth every evening. 135 tablet 3  . KLOR-CON M20 20 MEQ tablet Take 3 tablets (60 mEq total) by mouth daily. 90 tablet 3  . nebivolol (BYSTOLIC) 10 MG tablet Take 1 tablet (10 mg total) by mouth daily. 90 tablet 3  . valsartan (DIOVAN) 320 MG tablet Take 1 tablet by mouth daily.     No current facility-administered medications on file prior to visit.    Allergies  Allergen Reactions  . Norvasc [Amlodipine Besylate] Swelling  . Aspirin Hives     Assessment/Plan: Hypertension: BP is not at goal. Increase hydralazine to 100mg  BID. Will consider adding spironolactone pending renin/aldosterone results. This should decrease her potassium requirement. Will likely need to cut back on her lasix though if  urination becomes a problem. Avoid amlodipine due to swelling. It seems that adherence may also be playing a role in her BP control. Will have her follow-up in hypertension clinic in 2 months.   Thank you, Lelan Pons. Patterson Hammersmith, [place]

## 2015-10-25 ENCOUNTER — Telehealth: Payer: Self-pay | Admitting: Internal Medicine

## 2015-10-25 NOTE — Telephone Encounter (Signed)
Patient called with labs results. Voiced understanding.

## 2015-10-25 NOTE — Telephone Encounter (Signed)
Forward to [place]

## 2015-10-25 NOTE — Telephone Encounter (Signed)
Ms. Gaur is Returning a call . Please call   Thanks

## 2015-11-06 ENCOUNTER — Ambulatory Visit (INDEPENDENT_AMBULATORY_CARE_PROVIDER_SITE_OTHER): Payer: Managed Care, Other (non HMO) | Admitting: Internal Medicine

## 2015-11-06 ENCOUNTER — Encounter: Payer: Self-pay | Admitting: Internal Medicine

## 2015-11-06 VITALS — BP 162/96 | HR 64 | Ht 66.0 in | Wt 214.8 lb

## 2015-11-06 DIAGNOSIS — R0600 Dyspnea, unspecified: Secondary | ICD-10-CM

## 2015-11-06 DIAGNOSIS — G4719 Other hypersomnia: Secondary | ICD-10-CM

## 2015-11-06 DIAGNOSIS — Z79899 Other long term (current) drug therapy: Secondary | ICD-10-CM

## 2015-11-06 DIAGNOSIS — R0609 Other forms of dyspnea: Secondary | ICD-10-CM

## 2015-11-06 DIAGNOSIS — I10 Essential (primary) hypertension: Secondary | ICD-10-CM

## 2015-11-06 DIAGNOSIS — Z0000 Encounter for general adult medical examination without abnormal findings: Secondary | ICD-10-CM | POA: Insufficient documentation

## 2015-11-06 MED ORDER — AZILSARTAN-CHLORTHALIDONE 40-25 MG PO TABS: 1.0000 | ORAL_TABLET | Freq: Every day | ORAL | Status: DC

## 2015-11-06 NOTE — Progress Notes (Signed)
OFFICE NOTE  Chief Complaint:  Elevated blood pressure, follow-up studies  Primary Care Physician: Gara Kroner, MD  HPI:  Ariana Cohen is a 55 y.o. female with a history of hypertension, leg edema, hypokalemia (possibly related to Lasix) and excessive daytime sleepiness and fatigue. She's had a number of years it difficult to control hypertension. She's been followed by several primary care providers and recently saw Marijo File, who referred her to [place] for further evaluation. Over the years she's required a number of medications for blood pressure control. Recently she was on Norvasc which she said was helpful although had significant lower extremity swelling. Subsequently she had been put on clonidine which she said made her very sleepy in addition to her normal level of sleepiness. She is also on metoprolol and was placed on Diovan. She takes hydralazine as well. She was prescribed 50 mg 3 times a day however she only takes 100 mg daily. She works third shift and typically when she gets home sleeps for about 4 hours. She says she takes Lasix when she gets home and that often interrupts her sleep to get up to urinate. Recently she's required potassium based on laboratory work which showed some degree of hypokalemia. She generally takes the metoprolol also in the morning when she gets home from work before going to sleep as well as Diovan. She says that she does have difficulty sleeping when she tries to go to sleep. She is waking up several times either to urinate or for other reasons. Then she feels sleepy throughout the day. She oftentimes dozes off. She's noted to snore and denies any gasping or witnessed sleep apnea. Her sleepiness score today was quite high at 18. There is a strong family history of hypertension and her family both her mother and father and both grandparents. She also has a 35 year old sister with hypertension. She denies any chest pain but does get short of breath with  exertion. Her EKG today shows PVCs for which she says she is asymptomatic. There is also evidence of LVH and nonspecific T-wave changes. [facility] which showed normal LVEF of 55-60% and mild mitral regurgitation with no evidence of mitral valve prolapse. She does have a mitral murmur.  09/12/2015  Ariana Cohen returns today for follow-up. She reports continued hypertension despite changes in her medications. She is currently on hydralazine 50 mg twice a day, Toprol-XL 50 mg daily and valsartan 320 mg daily. Blood pressure today was 180/100. She's complaining of persistent swelling of her legs. She gets short of breath with activity. Her nuclear stress test was negative for ischemia however demonstrated a low EF of 45%. Echo however demonstrated normal systolic function therefore I suspect a gating abnormality. There was however stage II diastolic dysfunction and mild mitral regurgitation which is evidenced by her murmur. She does have a history of hypokalemia which could be related to Lasix or one must consider a more rare causes of hypertension such as hyperaldosteronism. There is however strong family history of hypertension. I also think she has significant fatigue and would benefit from a sleep study. This is scheduled on 10/09/2015.  11/06/2015  I saw Ariana Cohen back today in follow-up. Her blood pressure remains uncontrolled. She reports no improvement in her swelling with Lasix and to stop taking Lasix and potassium. Recently she's our pharmacist and hydralazine was increased to 100 mg twice a day. She reports strict compliance with medications although there is  been questions of this in the past. We talked about the possible dangers of me increasing her medicine if she was noncompliant and then all of a sudden started taking the medicine. That being said she insisted she was taking the medicine but blood pressure is not well controlled.  Recently I did renin and aldosterone testing which was negative.   PMHx:  Past Medical History  Diagnosis Date  . Hypertension   . Fibroids     Past Surgical History  Procedure Laterality Date  . Myomectomy  1992  . Cesarean section  1998; 2000    FAMHx:  Family History  Problem Relation Age of Onset  . Hypertension Mother   . Renal Disease Mother   . Hypertension Father   . Heart disease Father     also OSA  . Hypertension Maternal Grandmother   . Heart attack Maternal Grandmother   . Thyroid disease Maternal Grandmother     thyroidectomy  . Hypertension Maternal Grandfather   . Multiple myeloma Maternal Grandfather   . Sleep apnea Maternal Grandfather   . Hypertension Sister     SOCHx:   reports that she has never smoked. She has never used smokeless tobacco. She reports that she does not drink alcohol. Her drug history is not on file.  ALLERGIES:  Allergies  Allergen Reactions  . Norvasc [Amlodipine Besylate] Swelling  . Aspirin Hives    ROS: Pertinent items noted in HPI and remainder of comprehensive ROS otherwise negative.  HOME MEDS: Current Outpatient Prescriptions  Medication Sig Dispense Refill  . furosemide (LASIX) 40 MG tablet Take 1.5 tablets (60 mg total) by mouth every evening. 135 tablet 3  . hydrALAZINE (APRESOLINE) 100 MG tablet Take 1 tablet (100 mg total) by mouth 2 (two) times daily. 30 tablet 5  . KLOR-CON M20 20 MEQ tablet Take 3 tablets (60 mEq total) by mouth daily. 90 tablet 3  . nebivolol (BYSTOLIC) 10 MG tablet Take 1 tablet (10 mg total) by mouth daily. 90 tablet 3  . valsartan (DIOVAN) 320 MG tablet Take 1 tablet by mouth daily.     No current facility-administered medications for this visit.    LABS/IMAGING: No results found for this or any previous visit (from the past 48 hour(s)). No results found.  WEIGHTS: Wt Readings from Last 3 Encounters:  11/06/15 214 lb 12.8 oz (97.433 kg)  10/22/15 215 lb 12.8 oz (97.886 kg)    09/12/15 213 lb 9.6 oz (96.888 kg)    VITALS: BP 162/96 mmHg  Pulse 64  Ht '5\' 6"'$  (1.676 m)  Wt 214 lb 12.8 oz (97.433 kg)  BMI 34.69 kg/m2  EXAM: Deferred  EKG: Deferred  ASSESSMENT: 1. Uncontrolled hypertension 2. DOE - normal LV systolic function with grade 2 diastolic dysfunction and mild LVH 3. Murmur 4. PVCs 5. Significant daytime sleepiness with concern for sleep apnea 6. Leg edema  PLAN: 1.   Ariana Cohen continues to have uncontrolled hypertension. I would like to discontinue her valsartan and in place her on Edarbyclor. This should be more potent and provide some diuretic effect. She is now off of Lasix and potassium. She'll need a repeat metabolic profile in a week and follow-up with the hypertension clinic which is scheduled on August 10. She can follow-up with me in 3 months.  Pixie Casino, MD, [facility] Attending Cardiologist Davis C Beonka [place] 11/06/2015, 11:57 AM

## 2015-11-06 NOTE — Patient Instructions (Addendum)
Your physician has recommended you make the following change in your medication...  1. STOP valsartan 2. START edarbyclor 40-25mg  once daily  Your physician recommends that you return for lab work NEXT WEEK (BMET)  Your physician recommends that you schedule a follow-up appointment in [place] with Dr. Debara Pickett   Please keep your follow up appointment w/clinical pharmacists

## 2015-11-09 ENCOUNTER — Telehealth: Payer: Self-pay

## 2015-11-09 NOTE — Telephone Encounter (Signed)
Received call from patient.Stated Edarbyclor prescribed on 11/06/15 too expensive.Stated vouchers had expired and even with discount card too expensive.Stated she is going to file a appeal with insurance,but she wanted to know what to take.Advised I will send message to Dr.Hilty for advice.Bystolic 10 mg samples left at front desk.

## 2015-11-13 MED ORDER — TELMISARTAN-HCTZ 80-25 MG PO TABS: 1.0000 | ORAL_TABLET | Freq: Every day | ORAL | Status: DC

## 2015-11-13 NOTE — Telephone Encounter (Signed)
Ok .. Can try switch to telmisartan/HCTZ (80/25 mg) daily. This should be generic.  Dr. Lemmie Evens

## 2015-11-13 NOTE — Addendum Note (Signed)
Addended by: Fidel Levy on: 11/13/2015 12:14 PM   Modules accepted: Orders, Medications

## 2015-11-13 NOTE — Telephone Encounter (Addendum)
Left detailed message with med instructions.  Rx(s) sent to pharmacy electronically. Med list updated

## 2015-11-30 ENCOUNTER — Telehealth: Payer: Self-pay | Admitting: Internal Medicine

## 2015-11-30 NOTE — Telephone Encounter (Signed)
New Message  Pt discussed needing a breast exam w/ Dr Debara Pickett at last [place]- pt had follow up questions. Please call back and discuss.

## 2015-11-30 NOTE — Telephone Encounter (Signed)
Returned patient call.  Pt states she thought Dr. Debara Pickett ordered a mammogram last time she was in for a visit.  Advised patient I did not see anything regarding this in his note and typically our providers do not order these or make referrals for them.  Advised patient to call directly to attempt to make an appt if not then refer to PCP for further instructions.    Pt verbalized understanding.

## 2015-12-14 ENCOUNTER — Encounter: Payer: Self-pay | Admitting: Pharmacist

## 2015-12-14 ENCOUNTER — Ambulatory Visit (INDEPENDENT_AMBULATORY_CARE_PROVIDER_SITE_OTHER): Payer: Managed Care, Other (non HMO) | Admitting: Pharmacist

## 2015-12-14 VITALS — BP 174/88 | HR 57 | Wt 210.0 lb

## 2015-12-14 DIAGNOSIS — I10 Essential (primary) hypertension: Secondary | ICD-10-CM

## 2015-12-14 MED ORDER — HYDRALAZINE HCL 100 MG PO TABS: 100.0000 mg | ORAL_TABLET | Freq: Three times a day (TID) | ORAL | 1 refills | Status: DC

## 2015-12-14 NOTE — Patient Instructions (Signed)
Return for a a follow up appointment in 1 month.   Check your blood pressure at home daily (if able) and keep record of the readings.  Take your BP meds as follows: Start taking hydralazine 100mg  three times a day   Bring all of your meds, your BP cuff and your record of home blood pressures to your next appointment.  Exercise as you're able, try to walk approximately 30 minutes per day.  Keep salt intake to a minimum, especially watch canned and prepared boxed foods.  Eat more fresh fruits and vegetables and fewer canned items.  Avoid eating in fast food restaurants.    HOW TO TAKE YOUR BLOOD PRESSURE: . Rest 5 minutes before taking your blood pressure. .  Don't smoke or drink caffeinated beverages for at least 30 minutes before. . Take your blood pressure before (not after) you eat. . Sit comfortably with your back supported and both feet on the floor (don't cross your legs). . Elevate your arm to heart level on a table or a desk. . Use the proper sized cuff. It should fit smoothly and snugly around your bare upper arm. There should be enough room to slip a fingertip under the cuff. The bottom edge of the cuff should be 1 inch above the crease of the elbow. . Ideally, take 3 measurements at one sitting and record the average.

## 2015-12-14 NOTE — Progress Notes (Signed)
Patient ID: Ariana Cohen                 DOB: 1960/10/15                      MRN: [id number]     HPI: Ariana Cohen is a 55 y.o. female patient of Dr. Debara Pickett with [place] [facility] who presents today for hypertension follow-up. She is here today with her son and again appears extremely lethargic.   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 She is very excited about this transition and believes this may help with her overall health.   She report that she is doing better on the new diuretic rather than the lasix. She said she was more swollen with the lasix than she has been with the HCTZ.  Cardiac Hx: PVC, HTN  Current HTN meds:  Hydralazine 100mg  BID (8am and 9pm) nebivolol 10mg  at 8am Telmisartan/hctz 80/25mg  at 8am  Previously tried:  Amlodipine - LLE  BP goal: <140/90  Family History: mother and father had high blood pressure. Father passed from cardiac issues. She also has a sister with hypertension.  Social History: She has never used tobacco products. She drinks wine occasionally.   Diet: She states she eats salads.   Exercise: She has made improvements with exercising and has lost 5 lbs since my last visit with her.   Home BP readings: She reports her pressures usually spike around this time of day. She reports that the last number she recalls is 182/117. She denies chest pain, dizziness, SOB when her pressures are this elevated.   Wt Readings from Last 3 Encounters:  12/14/15 210 lb (95.3 kg)  11/06/15 214 lb 12.8 oz (97.4 kg)  10/22/15 215 lb 12.8 oz (97.9 kg)   BP Readings from Last 3 Encounters:  12/14/15 (!) 174/88  11/06/15 (!) 162/96  10/22/15 (!) 162/94   Pulse Readings from Last 3 Encounters:  12/14/15 (!) 57  11/06/15 64  10/22/15 66    Renal function: CrCl cannot be calculated (Patient's most recent lab result is older than the maximum 21 days allowed.).  Past Medical History:  Diagnosis Date  . Fibroids     . Hypertension     Current Outpatient Prescriptions on File Prior to Visit  Medication Sig Dispense Refill  . nebivolol (BYSTOLIC) 10 MG tablet Take 1 tablet (10 mg total) by mouth daily. 90 tablet 3  . telmisartan-hydrochlorothiazide (MICARDIS HCT) 80-25 MG tablet Take 1 tablet by mouth daily. 30 tablet 5   No current facility-administered medications on file prior to visit.     Allergies  Allergen Reactions  . Norvasc [Amlodipine Besylate] Swelling  . Aspirin Hives    Blood pressure (!) 174/88, pulse (!) 57, weight 210 lb (95.3 kg).   Assessment/Plan: Hypertension: BP is not at goal today and remains persistently elevated. We discussed addition of another agent or spacing the hydralazine three times a day. She prefers the latter option rather than adding another medication. She will add the third dose of hydralazine in the afternoon when she wakes up. Will have her go to the lab today for BMET ordered for several weeks ago by Dr. Debara Pickett. Will have her follow up in hypertension clinic in 1 month.   Thank you, Lelan Pons. Patterson Hammersmith, [facility]  12/14/2015 4:46 PM

## 2015-12-15 LAB — BASIC METABOLIC PANEL
BUN: 11 mg/dL (ref 7–25)
CO2: 30 mmol/L (ref 20–31)
Calcium: 9.2 mg/dL (ref 8.6–10.4)
Chloride: 103 mmol/L (ref 98–110)
Creat: 0.82 mg/dL (ref 0.50–1.05)
Glucose, Bld: 86 mg/dL (ref 65–99)
Potassium: 3.4 mmol/L — ABNORMAL LOW (ref 3.5–5.3)
Sodium: 141 mmol/L (ref 135–146)

## 2015-12-27 ENCOUNTER — Telehealth: Payer: Self-pay | Admitting: Internal Medicine

## 2015-12-27 NOTE — Telephone Encounter (Signed)
New message ° ° ° °Patient calling the office for samples of medication: ° ° °1.  What medication and dosage are you requesting samples for? Bystolic ° °2.  Are you currently out of this medication? yes ° ° °

## 2015-12-27 NOTE — Telephone Encounter (Signed)
Patient aware samples are at the front desk for pick up  

## 2016-01-02 ENCOUNTER — Other Ambulatory Visit: Payer: Self-pay

## 2016-01-02 MED ORDER — TELMISARTAN-HCTZ 80-25 MG PO TABS: 1.0000 | ORAL_TABLET | Freq: Every day | ORAL | 1 refills | Status: DC

## 2016-01-05 ENCOUNTER — Other Ambulatory Visit: Payer: Self-pay | Admitting: Internal Medicine

## 2016-01-05 NOTE — Telephone Encounter (Signed)
°*  STAT* If patient is at the pharmacy, call can be transferred to refill team.   1. Which medications need to be refilled? (please list name of each medication and dose if known) Telmisartan-HCTZ 2. Which pharmacy/location (including street and city if local pharmacy) is medication to be sent to?[phone]  3. Do they need a 30 day or 90 day supply? 90 days and refills

## 2016-01-09 NOTE — Telephone Encounter (Signed)
Medication Detail    Disp Refills Start End   telmisartan-hydrochlorothiazide (MICARDIS HCT) 80-25 MG tablet 90 tablet 1 01/02/2016    Sig - Route: Take 1 tablet by mouth daily. - Oral   E-Prescribing Status: Receipt confirmed by pharmacy (01/02/2016 4:21 PM EDT)   Pharmacy   [id number] - [place], [place]

## 2016-02-01 ENCOUNTER — Ambulatory Visit: Payer: Managed Care, Other (non HMO)

## 2016-02-09 ENCOUNTER — Encounter (HOSPITAL_BASED_OUTPATIENT_CLINIC_OR_DEPARTMENT_OTHER): Payer: Managed Care, Other (non HMO)

## 2016-02-15 ENCOUNTER — Ambulatory Visit: Payer: Self-pay | Admitting: Allergy

## 2016-03-15 ENCOUNTER — Encounter (HOSPITAL_BASED_OUTPATIENT_CLINIC_OR_DEPARTMENT_OTHER): Payer: Managed Care, Other (non HMO)

## 2016-03-17 ENCOUNTER — Encounter (HOSPITAL_BASED_OUTPATIENT_CLINIC_OR_DEPARTMENT_OTHER): Payer: Managed Care, Other (non HMO)

## 2016-03-18 ENCOUNTER — Encounter (HOSPITAL_BASED_OUTPATIENT_CLINIC_OR_DEPARTMENT_OTHER): Payer: Managed Care, Other (non HMO)

## 2016-03-26 ENCOUNTER — Ambulatory Visit (INDEPENDENT_AMBULATORY_CARE_PROVIDER_SITE_OTHER): Payer: Managed Care, Other (non HMO) | Admitting: Internal Medicine

## 2016-03-26 ENCOUNTER — Encounter: Payer: Self-pay | Admitting: Internal Medicine

## 2016-03-26 VITALS — BP 160/84 | HR 61 | Ht 66.5 in | Wt 216.0 lb

## 2016-03-26 DIAGNOSIS — R5383 Other fatigue: Secondary | ICD-10-CM | POA: Diagnosis not present

## 2016-03-26 DIAGNOSIS — G4719 Other hypersomnia: Secondary | ICD-10-CM

## 2016-03-26 DIAGNOSIS — I493 Ventricular premature depolarization: Secondary | ICD-10-CM | POA: Diagnosis not present

## 2016-03-26 DIAGNOSIS — I10 Essential (primary) hypertension: Secondary | ICD-10-CM | POA: Diagnosis not present

## 2016-03-26 MED ORDER — CLONIDINE HCL 0.1 MG/24HR TD PTWK: 0.1000 mg | MEDICATED_PATCH | TRANSDERMAL | 12 refills | Status: DC

## 2016-03-26 NOTE — Patient Instructions (Addendum)
Medication Instructions:  START Clonidine patches 0.1mg  Wear one patch for 1 week then change patch STOP Hydralazine  Labwork: None   Testing/Procedures: None    Follow-Up: Your physician recommends that you schedule a follow-up appointment in: 3 MONTHS WITH DR HILTY.  Your physician recommends that you schedule a follow-up appointment in: 3-4 WEEKS IN HYPERTENSION CLINIC  Any Other Special Instructions Will Be Listed Below (If Applicable).  SCHEDULE SLEEP STUDY   If you need a refill on your cardiac medications before your next appointment, please call your pharmacy.

## 2016-03-26 NOTE — Progress Notes (Signed)
OFFICE NOTE  Chief Complaint:  Elevated blood pressure, follow-up studies  Primary Care Physician: Gara Kroner, MD  HPI:  Ariana Cohen is a 55 y.o. female with a history of hypertension, leg edema, hypokalemia (possibly related to Lasix) and excessive daytime sleepiness and fatigue. She's had a number of years it difficult to control hypertension. She's been followed by several primary care providers and recently saw Marijo File, who referred her to [place] for further evaluation. Over the years she's required a number of medications for blood pressure control. Recently she was on Norvasc which she said was helpful although had significant lower extremity swelling. Subsequently she had been put on clonidine which she said made her very sleepy in addition to her normal level of sleepiness. She is also on metoprolol and was placed on Diovan. She takes hydralazine as well. She was prescribed 50 mg 3 times a day however she only takes 100 mg daily. She works third shift and typically when she gets home sleeps for about 4 hours. She says she takes Lasix when she gets home and that often interrupts her sleep to get up to urinate. Recently she's required potassium based on laboratory work which showed some degree of hypokalemia. She generally takes the metoprolol also in the morning when she gets home from work before going to sleep as well as Diovan. She says that she does have difficulty sleeping when she tries to go to sleep. She is waking up several times either to urinate or for other reasons. Then she feels sleepy throughout the day. She oftentimes dozes off. She's noted to snore and denies any gasping or witnessed sleep apnea. Her sleepiness score today was quite high at 18. There is a strong family history of hypertension and her family both her mother and father and both grandparents. She also has a 35 year old sister with hypertension. She denies any chest pain but does get short of breath with  exertion. Her EKG today shows PVCs for which she says she is asymptomatic. There is also evidence of LVH and nonspecific T-wave changes. [facility] which showed normal LVEF of 55-60% and mild mitral regurgitation with no evidence of mitral valve prolapse. She does have a mitral murmur.  09/12/2015  Ariana Cohen returns today for follow-up. She reports continued hypertension despite changes in her medications. She is currently on hydralazine 50 mg twice a day, Toprol-XL 50 mg daily and valsartan 320 mg daily. Blood pressure today was 180/100. She's complaining of persistent swelling of her legs. She gets short of breath with activity. Her nuclear stress test was negative for ischemia however demonstrated a low EF of 45%. Echo however demonstrated normal systolic function therefore I suspect a gating abnormality. There was however stage II diastolic dysfunction and mild mitral regurgitation which is evidenced by her murmur. She does have a history of hypokalemia which could be related to Lasix or one must consider a more rare causes of hypertension such as hyperaldosteronism. There is however strong family history of hypertension. I also think she has significant fatigue and would benefit from a sleep study. This is scheduled on 10/09/2015.  11/06/2015  I saw Ariana Cohen back today in follow-up. Her blood pressure remains uncontrolled. She reports no improvement in her swelling with Lasix and to stop taking Lasix and potassium. Recently she's our pharmacist and hydralazine was increased to 100 mg twice a day. She reports strict compliance with medications although there is  been questions of this in the past. We talked about the possible dangers of me increasing her medicine if she was noncompliant and then all of a sudden started taking the medicine. That being said she insisted she was taking the medicine but blood pressure is not well controlled.  Recently I did renin and aldosterone testing which was negative.  03/26/2016  Ariana Cohen was seen back today in follow-up. Her blood pressure remains elevated. It's been very difficult to control. Despite increasing her hydralazine up to 100 mg 3 times a day, she is on max dose Bystolic and max dose Micardis HCT. She's been intolerant to amlodipine due to venous insufficiency and swelling. Previously she been on clonidine short acting pills but had significant fatigue. She likely has sleep apnea but has not yet had her sleep study. That is pending.   PMHx:  Past Medical History:  Diagnosis Date  . Fibroids   . Hypertension     Past Surgical History:  Procedure Laterality Date  . [place]; 2000  . MYOMECTOMY  1992    FAMHx:  Family History  Problem Relation Age of Onset  . Hypertension Mother   . Renal Disease Mother   . Hypertension Father   . Heart disease Father     also OSA  . Hypertension Maternal Grandmother   . Heart attack Maternal Grandmother   . Thyroid disease Maternal Grandmother     thyroidectomy  . Hypertension Maternal Grandfather   . Multiple myeloma Maternal Grandfather   . Sleep apnea Maternal Grandfather   . Hypertension Sister     SOCHx:   reports that she has never smoked. She has never used smokeless tobacco. She reports that she does not drink alcohol. Her drug history is not on file.  ALLERGIES:  Allergies  Allergen Reactions  . Norvasc [Amlodipine Besylate] Swelling  . Aspirin Hives    ROS: Pertinent items noted in HPI and remainder of comprehensive ROS otherwise negative.  HOME MEDS: Current Outpatient Prescriptions  Medication Sig Dispense Refill  . nebivolol (BYSTOLIC) 10 MG tablet Take 1 tablet (10 mg total) by mouth daily. 90 tablet 3  . telmisartan-hydrochlorothiazide (MICARDIS HCT) 80-25 MG tablet Take 1 tablet by mouth daily. 90 tablet 1  . cloNIDine (CATAPRES - DOSED IN MG/24 HR) 0.1 mg/24hr patch Place 1 patch  (0.1 mg total) onto the skin once a week. 4 patch 12   No current facility-administered medications for this visit.     LABS/IMAGING: No results found for this or any previous visit (from the past 48 hour(s)). No results found.  WEIGHTS: Wt Readings from Last 3 Encounters:  03/26/16 216 lb (98 kg)  12/14/15 210 lb (95.3 kg)  11/06/15 214 lb 12.8 oz (97.4 kg)    VITALS: BP (!) 160/84   Pulse 61   Ht 5' 6.5" (1.689 m)   Wt 216 lb (98 kg)   BMI 34.34 kg/m   EXAM: General appearance: alert and no distress Neck: no carotid bruit and no JVD Lungs: clear to auscultation bilaterally Heart: regular rate and rhythm, S1, S2 normal, no murmur, click, rub or gallop Abdomen: soft, non-tender; bowel sounds normal; no masses,  no organomegaly Extremities: extremities normal, atraumatic, no cyanosis or edema Pulses: 2+ and symmetric Skin: Skin color, texture, turgor normal. No rashes or lesions Neurologic: Grossly normal Pscyh: Pleasant  EKG: Deferred  ASSESSMENT: 1. Uncontrolled hypertension 2. DOE - normal LV systolic function with grade 2 diastolic dysfunction and mild LVH 3.  Murmur 4. PVCs 5. Significant daytime sleepiness with concern for sleep apnea 6. Leg edema  PLAN: 1.   Mrs. Flitton continues to have uncontrolled hypertension. We will add a Catapres TTS 0.1 mg patch today. I discussed this with our pharmacist Tana Coast. She will be seen in follow-up in a few weeks and potentially consider titrating up the patch if needed. Follow-up with me in a few months.  Pixie Casino, MD, [facility] Attending Cardiologist [place] [facility] [place] 03/26/2016, 5:45 PM

## 2016-04-02 ENCOUNTER — Telehealth: Payer: Self-pay | Admitting: Internal Medicine

## 2016-04-02 NOTE — Telephone Encounter (Signed)
Pt wanted Dr Debara Pickett to know that the Clonidine is not working. Her blood pressure today is 175/90.

## 2016-04-02 NOTE — Telephone Encounter (Signed)
Pt reporting that since addition of clonidine last week, she's failed to see significant changes to home readings. Notes her systolic has been running in the 180s consistently. HR in 60s. She never has low BP readings, these are consistently elevated.  Notes a 197/115 this AM prior to meds, 185/100 after meds. Reports that her BP monitor at home is off, she thinks it runs as much as 15 pts higher than it should. She's been reporting compliance w her currently prescribed meds, she is set up for an upcoming BP visit w pharmD - wants to see if this can be rescheduled for a sooner appt, or if there are recommendations to increase any of her medications further prior to that. She's aware I am routing this for advice.

## 2016-04-03 NOTE — Telephone Encounter (Signed)
No changes to her meds until we can verify her cuff accuracy (or lack thereof).  Can she come in on Dec 7 at 10:30, 11, 2:30 or 3?  Schedule isn't open, but we can use those times.

## 2016-04-03 NOTE — Telephone Encounter (Signed)
Discussed w patient. She is amenable to 11am appt on dec 7th and this has been scheduled.

## 2016-04-11 ENCOUNTER — Encounter: Payer: Self-pay | Admitting: Pharmacist

## 2016-04-11 ENCOUNTER — Ambulatory Visit (INDEPENDENT_AMBULATORY_CARE_PROVIDER_SITE_OTHER): Payer: Managed Care, Other (non HMO) | Admitting: Pharmacist

## 2016-04-11 VITALS — BP 156/80 | HR 56

## 2016-04-11 DIAGNOSIS — I10 Essential (primary) hypertension: Secondary | ICD-10-CM

## 2016-04-11 MED ORDER — TELMISARTAN-HCTZ 80-25 MG PO TABS
1.0000 | ORAL_TABLET | Freq: Every day | ORAL | 1 refills | Status: DC
Start: 2016-04-11 — End: 2016-12-13

## 2016-04-11 MED ORDER — CLONIDINE HCL 0.2 MG/24HR TD PTWK: 0.2000 mg | MEDICATED_PATCH | TRANSDERMAL | 3 refills | Status: DC

## 2016-04-11 NOTE — Progress Notes (Signed)
Patient ID: Ariana Cohen                 DOB: 1961/01/05                      MRN: [id number]     HPI: Ariana Cohen is a 55 y.o. female patient of Dr. Debara Pickett with PMH below who presents today for hypertension follow up. She continues to have difficult to control blood pressure. She was recently seen by Dr. Debara Pickett and her hydralazine was discontinued. She was started on clonidine 0.1mg  weekly patch. She has since called in stating her blood pressure has been markedly elevated. No medication changes were made at that time.   Today she reports that her pressure has not been controlled but she has not been near as lethargic. She does appear to have much more energy today than at any of my prior visit with her.   She reports she believes her pressure will be somewhat higher today than usual as her mother was admitted to the hospital yesterday. She reports there was potentially a mistake at there mother's nursing home with her medications that caused her mother to become unresponsive.   She also reports that she has not been checking her blood pressure since she called the office because she has been frustrated with it being elevated (even if her cuff previously measured inappropriately). She plans to get a new cuff.    Cardiac Hx: PVC, HTN  Current HTN meds:  telmisartan - HCTZ 80-25mg  daily nebivolol 10mg  daily Clonidine 0.1mg  patch weekly  Previously tried:  Amlodipine - LLE  BP goal: <130/80  Family History: mother and father had high blood pressure. Father passed from cardiac issues. She also has a sister with hypertension.  Social History: She has never used tobacco products. She drinks wine occasionally.   Diet:She states she eats salads.   Exercise:She has made improvements with exercising.   Home BP readings:  None recently    Wt Readings from Last 3 Encounters:  03/26/16 216 lb (98 kg)  12/14/15 210 lb (95.3 kg)  11/06/15 214 lb 12.8 oz (97.4 kg)   BP Readings  from Last 3 Encounters:  04/11/16 (!) 156/80  03/26/16 (!) 160/84  12/14/15 (!) 174/88   Pulse Readings from Last 3 Encounters:  04/11/16 (!) 56  03/26/16 61  12/14/15 (!) 57    Renal function: CrCl cannot be calculated (Patient's most recent lab result is older than the maximum 21 days allowed.).  Past Medical History:  Diagnosis Date  . Fibroids   . Hypertension     Current Outpatient Prescriptions on File Prior to Visit  Medication Sig Dispense Refill  . nebivolol (BYSTOLIC) 10 MG tablet Take 1 tablet (10 mg total) by mouth daily. 90 tablet 3   No current facility-administered medications on file prior to visit.     Allergies  Allergen Reactions  . Norvasc [Amlodipine Besylate] Swelling  . Aspirin Hives    Blood pressure (!) 156/80, pulse (!) 56, SpO2 98 %.   Assessment/Plan: Hypertension: BP not at goal. Will titrate clonidine patch to 0.2mg /day patch. She knows to call if significant increase in lethargy. She also will obtain a new blood pressure cuff. Recommendation for Omron brand given. Follow up appt in hypertension clinic in 4 weeks.    Thank you, Lelan Pons. Patterson Hammersmith, [facility]  04/11/2016 1:08 PM

## 2016-04-11 NOTE — Patient Instructions (Signed)
Return for a follow up appointment in 4 weeks after the holidays  Check your blood pressure at home daily (if able) and keep record of the readings.  Take your BP meds as follows: START using higher dose of clonidine patch Continue all other medications as prescribed  Bring all of your meds, your BP cuff and your record of home blood pressures to your next appointment.  Exercise as you're able, try to walk approximately 30 minutes per day.  Keep salt intake to a minimum, especially watch canned and prepared boxed foods.  Eat more fresh fruits and vegetables and fewer canned items.  Avoid eating in fast food restaurants.    HOW TO TAKE YOUR BLOOD PRESSURE: . Rest 5 minutes before taking your blood pressure. .  Don't smoke or drink caffeinated beverages for at least 30 minutes before. . Take your blood pressure before (not after) you eat. . Sit comfortably with your back supported and both feet on the floor (don't cross your legs). . Elevate your arm to heart level on a table or a desk. . Use the proper sized cuff. It should fit smoothly and snugly around your bare upper arm. There should be enough room to slip a fingertip under the cuff. The bottom edge of the cuff should be 1 inch above the crease of the elbow. . Ideally, take 3 measurements at one sitting and record the average.

## 2016-04-12 ENCOUNTER — Encounter (HOSPITAL_BASED_OUTPATIENT_CLINIC_OR_DEPARTMENT_OTHER): Payer: Managed Care, Other (non HMO)

## 2016-04-26 ENCOUNTER — Ambulatory Visit: Payer: Managed Care, Other (non HMO)

## 2016-05-01 ENCOUNTER — Ambulatory Visit: Payer: Self-pay

## 2016-05-16 ENCOUNTER — Ambulatory Visit: Payer: Managed Care, Other (non HMO)

## 2016-05-24 ENCOUNTER — Ambulatory Visit (INDEPENDENT_AMBULATORY_CARE_PROVIDER_SITE_OTHER): Payer: Managed Care, Other (non HMO) | Admitting: Pharmacist

## 2016-05-24 ENCOUNTER — Ambulatory Visit (HOSPITAL_BASED_OUTPATIENT_CLINIC_OR_DEPARTMENT_OTHER): Payer: Managed Care, Other (non HMO) | Attending: Internal Medicine | Admitting: Cardiovascular Disease

## 2016-05-24 VITALS — Ht 66.0 in | Wt 218.0 lb

## 2016-05-24 VITALS — BP 150/100 | HR 59

## 2016-05-24 DIAGNOSIS — I10 Essential (primary) hypertension: Secondary | ICD-10-CM

## 2016-05-24 DIAGNOSIS — R5383 Other fatigue: Secondary | ICD-10-CM | POA: Insufficient documentation

## 2016-05-24 DIAGNOSIS — G4733 Obstructive sleep apnea (adult) (pediatric): Secondary | ICD-10-CM | POA: Insufficient documentation

## 2016-05-24 DIAGNOSIS — R0683 Snoring: Secondary | ICD-10-CM | POA: Diagnosis present

## 2016-05-24 DIAGNOSIS — G4710 Hypersomnia, unspecified: Secondary | ICD-10-CM | POA: Diagnosis present

## 2016-05-24 DIAGNOSIS — G4719 Other hypersomnia: Secondary | ICD-10-CM

## 2016-05-24 DIAGNOSIS — G4730 Sleep apnea, unspecified: Secondary | ICD-10-CM

## 2016-05-24 MED ORDER — MINOXIDIL 2.5 MG PO TABS: 2.5000 mg | ORAL_TABLET | Freq: Every day | ORAL | 0 refills | Status: DC

## 2016-05-24 NOTE — Progress Notes (Signed)
Patient ID: Ariana Cohen                 DOB: 05-Jul-1960                      MRN: [id number]     HPI: Ariana Cohen is a 56 y.o. female patient of Dr. Debara Pickett with PMH relevant for PVC and HTN. Presents today for hypertension follow up.  Clonidine patch was increased to 0.2mg  weekly on 04/11/2016.   She denies headaches, increase lethargy, orthostatic hypotension, and shortness of breath or swelling.  Reports dry mouth the 1st week after changing clonidine dose but feeling better now.   She also reports that she has not been checking her blood pressure and need to get a new BP cuff.     Current HTN meds:  telmisartan - HCTZ 80-25mg  daily nebivolol 10mg  daily Clonidine 0.2mg  patch weekly  Previously tried:  Amlodipine - LLE Hydralazine -  lack therapeutic response Diovan/HCT - lack therapeutic response  BP goal: <130/80  Family History: mother and father had high blood pressure. Father passed from cardiac issues. She also has a sister with hypertension.  Social History: She has never used tobacco products. She drinks wine occasionally.   Diet:She states she eats salads.   Exercise:She has made improvements with exercising.   Home BP readings: None available  Wt Readings from Last 3 Encounters:  03/26/16 216 lb (98 kg)  12/14/15 210 lb (95.3 kg)  11/06/15 214 lb 12.8 oz (97.4 kg)   BP Readings from Last 3 Encounters:  04/11/16 (!) 156/80  03/26/16 (!) 160/84  12/14/15 (!) 174/88   Pulse Readings from Last 3 Encounters:  04/11/16 (!) 56  03/26/16 61  12/14/15 (!) 57     Past Medical History:  Diagnosis Date  . Fibroids   . Hypertension       Allergies  Allergen Reactions  . Norvasc [Amlodipine Besylate] Swelling  . Aspirin Hives    Assessment/Plan:  Hypertension: BP remains elevated after clonidine dose increased 4 weeks ago.  Not appropriate to increase clonidine any more due to lack of therapeutic response and potential to increase ADRs. Patient  intolerant to amlodipine, beta-blocker at maximum dose with HR = 59, and Micardis-HCTZ also at max daily dose.  Will start minoxidil 2.5mg  daily today and re-assess for response in 2 weeks.  Patient instructed to call if significant increase in lethargy or symptoms of hypotension noted.   Misako Roeder Rodriguez-Guzman PharmD, [place] [id number] 05/24/2016 3:39 PM

## 2016-05-24 NOTE — Patient Instructions (Addendum)
Return for a a follow up appointment in 2 weeks  Your blood pressure today is 150/100 pulse 59  Check your blood pressure at home daily (if able) and keep record of the readings.  Take your BP meds as follows: telmisartan - HCTZ 80-25mg  daily (midardis-HCT) nebivolol 10mg  daily (bystolic) Clonidine 0.2mg  patch weekly **Start minoxidil 2.5mg  daily at bedtime**  Bring all of your meds, your BP cuff and your record of home blood pressures to your next appointment.  Exercise as you're able, try to walk approximately 30 minutes per day.  Keep salt intake to a minimum, especially watch canned and prepared boxed foods.  Eat more fresh fruits and vegetables and fewer canned items.  Avoid eating in fast food restaurants.    HOW TO TAKE YOUR BLOOD PRESSURE: . Rest 5 minutes before taking your blood pressure. .  Don't smoke or drink caffeinated beverages for at least 30 minutes before. . Take your blood pressure before (not after) you eat. . Sit comfortably with your back supported and both feet on the floor (don't cross your legs). . Elevate your arm to heart level on a table or a desk. . Use the proper sized cuff. It should fit smoothly and snugly around your bare upper arm. There should be enough room to slip a fingertip under the cuff. The bottom edge of the cuff should be 1 inch above the crease of the elbow. . Ideally, take 3 measurements at one sitting and record the average.

## 2016-06-04 ENCOUNTER — Other Ambulatory Visit: Payer: Self-pay

## 2016-06-04 MED ORDER — CLONIDINE HCL 0.2 MG/24HR TD PTWK: 0.2000 mg | MEDICATED_PATCH | TRANSDERMAL | 3 refills | Status: DC

## 2016-06-05 ENCOUNTER — Telehealth: Payer: Self-pay | Admitting: Internal Medicine

## 2016-06-05 NOTE — Telephone Encounter (Signed)
Pt states recently she was prescribed lotenin. (when seen in [facility] on 1/19) Had side effects of fatigue, LE swelling, finger swelling/pain, "legs about to pop".  Notes she discontinued med (last dose last night) and wants to let us know.   Informed her I would send to [facility] to review prior to her appt on 2/19 for possible alternatives.  Pt voiced understanding and thanks.

## 2016-06-05 NOTE — Telephone Encounter (Signed)
LMOM for Ariana Cohen. Agreed on stopping medication due to ADR but need more information about current BP records and symptoms before new recommendation.

## 2016-06-05 NOTE — Telephone Encounter (Signed)
New Message  Pt c/o medication issue:  1. Name of Medication: minoxidil (Loniten) 2.5 mg tablet once daily  2. How are you currently taking this medication (dosage and times per day)? See above  3. Are you having a reaction (difficulty breathing--STAT)? N/A  4. What is your medication issue? Pt voiced she is having side effects from this medication.  Please f/u

## 2016-06-06 NOTE — Telephone Encounter (Signed)
3rd called for f/u.  Unable to re-assess BP or symptoms at this time. Will follow up with patient during next appointment on Feb/19/18

## 2016-06-07 ENCOUNTER — Ambulatory Visit: Payer: Managed Care, Other (non HMO)

## 2016-06-11 ENCOUNTER — Ambulatory Visit (INDEPENDENT_AMBULATORY_CARE_PROVIDER_SITE_OTHER): Payer: Managed Care, Other (non HMO) | Admitting: Internal Medicine

## 2016-06-11 ENCOUNTER — Encounter: Payer: Self-pay | Admitting: Internal Medicine

## 2016-06-11 VITALS — BP 138/76 | HR 66 | Ht 66.5 in | Wt 217.0 lb

## 2016-06-11 DIAGNOSIS — R0683 Snoring: Secondary | ICD-10-CM | POA: Diagnosis not present

## 2016-06-11 DIAGNOSIS — R0609 Other forms of dyspnea: Secondary | ICD-10-CM

## 2016-06-11 DIAGNOSIS — I1A0 Resistant hypertension: Secondary | ICD-10-CM

## 2016-06-11 DIAGNOSIS — R0600 Dyspnea, unspecified: Secondary | ICD-10-CM

## 2016-06-11 DIAGNOSIS — I10 Essential (primary) hypertension: Secondary | ICD-10-CM

## 2016-06-11 DIAGNOSIS — R5383 Other fatigue: Secondary | ICD-10-CM | POA: Diagnosis not present

## 2016-06-11 DIAGNOSIS — R0602 Shortness of breath: Secondary | ICD-10-CM | POA: Diagnosis not present

## 2016-06-11 NOTE — Patient Instructions (Addendum)
MEDICATION INSTRUCTIONS -- BYSTOLIC - take in the evening  Your physician wants you to follow-up in: 3 months with Dr. Debara Pickett. You will receive a reminder letter in the mail two months in advance. If you don't receive a letter, please call our office to schedule the follow-up appointment.

## 2016-06-11 NOTE — Progress Notes (Signed)
OFFICE NOTE  Chief Complaint:  Elevated blood pressure, follow-up studies  Primary Care Physician: Gara Kroner, MD  HPI:  Ariana Cohen is a 56 y.o. female with a history of hypertension, leg edema, hypokalemia (possibly related to Lasix) and excessive daytime sleepiness and fatigue. She's had a number of years it difficult to control hypertension. She's been followed by several primary care providers and recently saw Marijo File, who referred her to [place] for further evaluation. Over the years she's required a number of medications for blood pressure control. Recently she was on Norvasc which she said was helpful although had significant lower extremity swelling. Subsequently she had been put on clonidine which she said made her very sleepy in addition to her normal level of sleepiness. She is also on metoprolol and was placed on Diovan. She takes hydralazine as well. She was prescribed 50 mg 3 times a day however she only takes 100 mg daily. She works third shift and typically when she gets home sleeps for about 4 hours. She says she takes Lasix when she gets home and that often interrupts her sleep to get up to urinate. Recently she's required potassium based on laboratory work which showed some degree of hypokalemia. She generally takes the metoprolol also in the morning when she gets home from work before going to sleep as well as Diovan. She says that she does have difficulty sleeping when she tries to go to sleep. She is waking up several times either to urinate or for other reasons. Then she feels sleepy throughout the day. She oftentimes dozes off. She's noted to snore and denies any gasping or witnessed sleep apnea. Her sleepiness score today was quite high at 18. There is a strong family history of hypertension and her family both her mother and father and both grandparents. She also has a 35 year old sister with hypertension. She denies any chest pain but does get short of breath with  exertion. Her EKG today shows PVCs for which she says she is asymptomatic. There is also evidence of LVH and nonspecific T-wave changes. [facility] which showed normal LVEF of 55-60% and mild mitral regurgitation with no evidence of mitral valve prolapse. She does have a mitral murmur.  09/12/2015  Ariana Cohen returns today for follow-up. She reports continued hypertension despite changes in her medications. She is currently on hydralazine 50 mg twice a day, Toprol-XL 50 mg daily and valsartan 320 mg daily. Blood pressure today was 180/100. She's complaining of persistent swelling of her legs. She gets short of breath with activity. Her nuclear stress test was negative for ischemia however demonstrated a low EF of 45%. Echo however demonstrated normal systolic function therefore I suspect a gating abnormality. There was however stage II diastolic dysfunction and mild mitral regurgitation which is evidenced by her murmur. She does have a history of hypokalemia which could be related to Lasix or one must consider a more rare causes of hypertension such as hyperaldosteronism. There is however strong family history of hypertension. I also think she has significant fatigue and would benefit from a sleep study. This is scheduled on 10/09/2015.  11/06/2015  I saw Ariana Cohen back today in follow-up. Her blood pressure remains uncontrolled. She reports no improvement in her swelling with Lasix and to stop taking Lasix and potassium. Recently she's our pharmacist and hydralazine was increased to 100 mg twice a day. She reports strict compliance with medications although there is  been questions of this in the past. We talked about the possible dangers of me increasing her medicine if she was noncompliant and then all of a sudden started taking the medicine. That being said she insisted she was taking the medicine but blood pressure is not well controlled.  Recently I did renin and aldosterone testing which was negative.  03/26/2016  Ariana Cohen was seen back today in follow-up. Her blood pressure remains elevated. It's been very difficult to control. Despite increasing her hydralazine up to 100 mg 3 times a day, she is on max dose Bystolic and max dose Micardis HCT. She's been intolerant to amlodipine due to venous insufficiency and swelling. Previously she been on clonidine short acting pills but had significant fatigue. She likely has sleep apnea but has not yet had her sleep study. That is pending.  06/11/2016  Ariana Cohen returns today for follow-up. Her blood pressure continues to be difficult to manage. She is currently on Catapres TTS 0.2 mg patch with hydralazine 100 mg 3 times a day and Bystolic 10 mg daily. She also takes eye cart his HCTZ 80/25 mg daily. She did see Racquel of pharmacist and her hypertension clinic who recommended substituting low-dose minoxidil to decrease her clonidine however this was significantly intolerant. She developed significant swelling and side effects related to the minoxidil discontinued it. Surprisingly, blood pressure is better controlled today in the office at 138/76, however this morning she noted it was 180/111. Not clear why there are some significant differences in her pressures. She has a very new blood pressure cuff with features to export it to her cell phone and I reviewed a number of blood pressure readings with her in the office. There does seem to be a trend toward morning blood pressure elevations suggesting that she may not be getting full coverage throughout the night.   PMHx:  Past Medical History:  Diagnosis Date  . Fibroids   . Hypertension     Past Surgical History:  Procedure Laterality Date  . [place]; 2000  . MYOMECTOMY  1992    FAMHx:  Family History  Problem Relation Age of Onset  . Hypertension Mother   . Renal Disease Mother   . Hypertension Father   . Heart  disease Father     also OSA  . Hypertension Maternal Grandmother   . Heart attack Maternal Grandmother   . Thyroid disease Maternal Grandmother     thyroidectomy  . Hypertension Maternal Grandfather   . Multiple myeloma Maternal Grandfather   . Sleep apnea Maternal Grandfather   . Hypertension Sister     SOCHx:   reports that she has never smoked. She has never used smokeless tobacco. She reports that she does not drink alcohol. Her drug history is not on file.  ALLERGIES:  Allergies  Allergen Reactions  . Norvasc [Amlodipine Besylate] Swelling  . Aspirin Hives  . Minoxidil Other (See Comments)    Weight gain, extreme edema  Legs, feet, hands, lethargic      ROS: Pertinent items noted in HPI and remainder of comprehensive ROS otherwise negative.  HOME MEDS: Current Outpatient Prescriptions  Medication Sig Dispense Refill  . cetirizine (ZYRTEC) 10 MG tablet Take 10 mg by mouth daily.    . cloNIDine (CATAPRES - DOSED IN MG/24 HR) 0.2 mg/24hr patch Place 1 patch (0.2 mg total) onto the skin once a week. 12 patch 3  . hydrALAZINE (APRESOLINE) 100 MG tablet Take 1 tablet by mouth 3 (three)  times daily.    . nebivolol (BYSTOLIC) 10 MG tablet Take 1 tablet (10 mg total) by mouth daily. 90 tablet 3  . telmisartan-hydrochlorothiazide (MICARDIS HCT) 80-25 MG tablet Take 1 tablet by mouth daily. 90 tablet 1   No current facility-administered medications for this visit.     LABS/IMAGING: No results found for this or any previous visit (from the past 48 hour(s)). No results found.  WEIGHTS: Wt Readings from Last 3 Encounters:  06/11/16 217 lb (98.4 kg)  05/25/16 218 lb (98.9 kg)  03/26/16 216 lb (98 kg)    VITALS: BP 138/76   Pulse 66   Ht 5' 6.5" (1.689 m)   Wt 217 lb (98.4 kg)   BMI 34.50 kg/m   EXAM: Deferred  EKG: Deferred  ASSESSMENT: 1. Uncontrolled hypertension 2. DOE - normal LV systolic function with grade 2 diastolic dysfunction and mild  LVH 3. Murmur 4. PVCs 5. Significant daytime sleepiness with concern for sleep apnea 6. Leg edema  PLAN: 1.   Ariana Cohen continues to be uncontrolled with reverse her hypertension. I've advised her to move her Bystolic to take at night to see if this levels out her morning blood pressures as blood pressure was normal today in the office, we should continue to monitor things. She did have testing for secondary hypertension which has been negative. Despite this, we may need to consider adding Aldactone or discontinuing hydrochlorothiazide in favor of chlorthalidone.   Pixie Casino, MD, [facility] Attending Cardiologist [place] 06/11/2016, 5:57 PM

## 2016-06-12 DIAGNOSIS — G4733 Obstructive sleep apnea (adult) (pediatric): Secondary | ICD-10-CM | POA: Insufficient documentation

## 2016-06-12 DIAGNOSIS — G4730 Sleep apnea, unspecified: Secondary | ICD-10-CM | POA: Insufficient documentation

## 2016-06-12 NOTE — Procedures (Signed)
Patient Name: Ariana Cohen, Ariana Cohen Date: 05/24/2016 Gender: Female D.O.B: 11/05/1960 Age (years): 55 Referring Provider: Nadean Corwin Hilty Height (inches): 66 Interpreting Physician: Shelva Majestic MD, ABSM Weight (lbs): 218 RPSGT: Earney [place] BMI: 35 MRN: [id number] Neck Size: 15.00  CLINICAL INFORMATION Sleep Study Type: NPSG  Indication for sleep study: Excessive Daytime Sleepiness, Fatigue, Snoring  Epworth Sleepiness Score: 23  SLEEP STUDY TECHNIQUE As per the AASM Manual for the Scoring of Sleep and Associated Events v2.3 (April 2016) with a hypopnea requiring 4% desaturations.  The channels recorded and monitored were frontal, central and occipital EEG, electrooculogram (EOG), submentalis EMG (chin), nasal and oral airflow, thoracic and abdominal wall motion, anterior tibialis EMG, snore microphone, electrocardiogram, and pulse oximetry.  MEDICATIONS cetirizine (ZYRTEC) 10 MG tablet cloNIDine (CATAPRES - DOSED IN MG/24 HR) 0.2 mg/24hr patch hydrALAZINE (APRESOLINE) 100 MG tablet nebivolol (BYSTOLIC) 10 MG tablet telmisartan-hydrochlorothiazide (MICARDIS HCT) 80-25 MG tablet cetirizine (ZYRTEC) 10 MG tablet cloNIDine (CATAPRES - DOSED IN MG/24 HR) 0.2 mg/24hr patch hydrALAZINE (APRESOLINE) 100 MG tablet nebivolol (BYSTOLIC) 10 MG tablet telmisartan-hydrochlorothiazide (MICARDIS HCT) 80-25 MG tablet  Medications self-administered by patient taken the night of the study : N/A  SLEEP ARCHITECTURE The study was initiated at 10:13:02 PM and ended at 4:42:53 AM.  Sleep onset time was 6.3 minutes and the sleep efficiency was 95.7%. The total sleep time was 373.1 minutes. Wake after sleep onset (WASO) 10.5 minutes.  Stage REM latency was 145.0 minutes.  The patient spent 1.07% of the night in stage N1 sleep, 95.85% in stage N2 sleep, 0.13% in stage N3 and 2.95% in REM.  Alpha intrusion was absent.  Supine sleep was 39.97%.  RESPIRATORY PARAMETERS The  overall apnea/hypopnea index (AHI) was 4.8 per hour. There were 0 total apneas, including 0 obstructive, 0 central and 0 mixed apneas. There were 30 hypopneas and 11 RERAs.  The AHI during Stage REM sleep was 0.0 per hour.  AHI while supine was 10.1 per hour.  The mean oxygen saturation was 93.92%. The minimum SpO2 during sleep was 86.00%.  Loud snoring was noted during this study.  CARDIAC DATA The 2 lead EKG demonstrated sinus rhythm. The mean heart rate was 55.18 beats per minute. Other EKG findings include: PVCs.  LEG MOVEMENT DATA The total PLMS were 0 with a resulting PLMS index of 0.00. Associated arousal with leg movement index was 0.0 .  IMPRESSIONS - Increased Upper Airway Resistance Syndrome/Mild sleep apnea with an AHI 4.8/h overall; RDI 6.6/hr and 10.1/h during supine sleep. - No significant central sleep apnea occurred during this study (CAI = 0.0/h). - Mild oxygen desaturation was noted during this study (Min O2 = 86.00%). - The patient snored with Loud snoring volume. - Reduced REM sleep with prolonged latency to REM sleep. - Excessive Daytime sleepiness. - EKG findings include PVCs. - Clinically significant periodic limb movements did not occur during sleep. No significant associated arousals.  DIAGNOSIS - Sleep apnea, unspecified  G47.30  RECOMMENDATIONS - Efforts should be made to optimize nasal and orophayngeal patency.  - Consider initial alternatives to CPAP therapy such as customized nasal appliance , but with severity of sleepiness patient may benefit from CPAP therapy. - Consider evaluation for idiopathic hypersomnolence/narcolepsy with f/u PSG/MLST testing. - Avoid alcohol, sedatives and other CNS depressants that may worsen sleep apnea and disrupt normal sleep architecture. - Sleep hygiene should be reviewed to assess factors that may improve sleep quality. - Weight management and regular exercise should be initiated or continued  if appropriate. -  Recommend sleep clinic evaluation to discuss options. -    [Electronically signed] 06/12/2016 02:58 PM  Shelva Majestic MD, [facility],  ABSM Diplomate, American Board of Sleep Medicine   NPI: [id number] [place] PH: [phone]   FX: [phone] [place]

## 2016-06-13 ENCOUNTER — Telehealth: Payer: Self-pay | Admitting: *Deleted

## 2016-06-13 NOTE — Telephone Encounter (Signed)
-----   Message from Troy Sine, MD sent at 06/12/2016  3:05 PM EST ----- Mariann Laster please notify pt of results; set up for office eval to discuss options of CPAP, oral appliance ? MLST

## 2016-06-13 NOTE — Telephone Encounter (Signed)
Patient notified of sleep study results and recommendations. Appointment will be made with Dr Claiborne Billings to discuss.

## 2016-06-21 ENCOUNTER — Ambulatory Visit: Payer: Managed Care, Other (non HMO) | Admitting: Cardiovascular Disease

## 2016-06-24 ENCOUNTER — Telehealth: Payer: Self-pay | Admitting: Cardiovascular Disease

## 2016-06-24 ENCOUNTER — Ambulatory Visit: Payer: Managed Care, Other (non HMO) | Admitting: Internal Medicine

## 2016-06-24 NOTE — Telephone Encounter (Signed)
LEFT MESSAGE to call back

## 2016-06-24 NOTE — Telephone Encounter (Signed)
New Message  Pt call requesting to speak with RN about getting a note to be out of work for two weeks. Pt states she has been dealing with a lot of issues; being her health and bp issues; also with her mothers health issues. Please call back to discuss

## 2016-06-24 NOTE — Telephone Encounter (Signed)
No answer, rings to VM. Left msg for pt to call.

## 2016-06-24 NOTE — Telephone Encounter (Signed)
Follow up    Patient returning call back to nurse from today - previous message from today.

## 2016-06-25 ENCOUNTER — Encounter (HOSPITAL_COMMUNITY): Payer: Self-pay | Admitting: Emergency Medicine

## 2016-06-25 ENCOUNTER — Ambulatory Visit (HOSPITAL_COMMUNITY)
Admission: EM | Admit: 2016-06-25 | Discharge: 2016-06-25 | Disposition: A | Discharge status: Home / Self Care | Payer: Managed Care, Other (non HMO) | Attending: Family Medicine | Admitting: Family Medicine

## 2016-06-25 DIAGNOSIS — F410 Panic disorder [episodic paroxysmal anxiety] without agoraphobia: Secondary | ICD-10-CM

## 2016-06-25 HISTORY — DX: Anxiety disorder, unspecified: F41.9

## 2016-06-25 MED ORDER — CLONAZEPAM 0.5 MG PO TABS: 0.5000 mg | ORAL_TABLET | Freq: Two times a day (BID) | ORAL | 0 refills | Status: DC | PRN

## 2016-06-25 NOTE — Telephone Encounter (Signed)
Returning  Ariana Cohen's call from yesterday. °

## 2016-06-25 NOTE — Discharge Instructions (Signed)
Contact your eap program and use medicine as needed, see your doctor as needed.

## 2016-06-25 NOTE — ED Triage Notes (Signed)
The patient presented to the [facility] with a complaint of an anxiety attack.

## 2016-06-25 NOTE — Telephone Encounter (Signed)
Returned call to patient-patient states she is applying for short term disability and we should have received paperwork to fill out from [facility].  Patient states she is having blood pressure issues (hard to control) and also anxiety attacks.  States she believes the amount of stress at work is causing her blood pressure issues as well as anxiety.  Patient requesting medication for anxiety as well, reports she does not have a PCP at this time.    Advised that I am unsure if paperwork was received as well as if medication would be prescribed for anxiety by Dr. Debara Pickett.    Advised I would check with primary RN to verify paperwork was received as well as route to Dr. Debara Pickett to make him aware and for further recommendations .

## 2016-06-25 NOTE — ED Provider Notes (Signed)
[place]    CSN: [id number] Arrival date & time: 06/25/16  1421     History   Chief Complaint Chief Complaint  Patient presents with  . Anxiety    HPI IRMA ROULHAC is a 56 y.o. female.   The history is provided by the patient.  Anxiety  This is a new problem. The current episode started more than 1 week ago. The problem has been gradually worsening (pt with mult stress issues in her life now causing panic attackes). Pertinent negatives include no chest pain, no abdominal pain and no headaches.    Past Medical History:  Diagnosis Date  . Anxiety   . Fibroids   . Hypertension     Patient Active Problem List   Diagnosis Date Noted  . OSA (obstructive sleep apnea) 06/12/2016  . Snoring 06/11/2016  . Other fatigue 03/26/2016  . Medication management 11/06/2015  . Resistant hypertension 11/06/2015  . Excessive daytime sleepiness 11/06/2015  . Murmur 08/11/2015  . PVCs (premature ventricular contractions) 08/11/2015  . Shortness of breath 08/11/2015  . Essential hypertension 08/11/2015    Past Surgical History:  Procedure Laterality Date  . [place]; 2000  . MYOMECTOMY  1992    OB History    No data available       Home Medications    Prior to Admission medications   Medication Sig Start Date End Date Taking? Authorizing Provider  cetirizine (ZYRTEC) 10 MG tablet Take 10 mg by mouth daily.   Yes Historical Provider, MD  cloNIDine (CATAPRES - DOSED IN MG/24 HR) 0.2 mg/24hr patch Place 1 patch (0.2 mg total) onto the skin once a week. 06/04/16  Yes Pixie Casino, MD  hydrALAZINE (APRESOLINE) 100 MG tablet Take 1 tablet by mouth 3 (three) times daily. 06/09/16  Yes Historical Provider, MD  nebivolol (BYSTOLIC) 10 MG tablet Take 1 tablet (10 mg total) by mouth daily. 09/12/15  Yes Pixie Casino, MD  telmisartan-hydrochlorothiazide (MICARDIS HCT) 80-25 MG tablet Take 1 tablet by mouth daily. 04/11/16  Yes Pixie Casino, MD    clonazePAM (KLONOPIN) 0.5 MG tablet Take 1 tablet (0.5 mg total) by mouth 2 (two) times daily as needed for anxiety. 06/25/16   Billy Fischer, MD    Family History Family History  Problem Relation Age of Onset  . Hypertension Mother   . Renal Disease Mother   . Hypertension Father   . Heart disease Father     also OSA  . Hypertension Maternal Grandmother   . Heart attack Maternal Grandmother   . Thyroid disease Maternal Grandmother     thyroidectomy  . Hypertension Maternal Grandfather   . Multiple myeloma Maternal Grandfather   . Sleep apnea Maternal Grandfather   . Hypertension Sister     Social History Social History  Substance Use Topics  . Smoking status: Never Smoker  . Smokeless tobacco: Never Used  . Alcohol use No     Allergies   Norvasc [amlodipine besylate]; Aspirin; and Minoxidil   Review of Systems Review of Systems  Constitutional: Negative.   Respiratory: Negative.   Cardiovascular: Negative.  Negative for chest pain.  Gastrointestinal: Negative for abdominal pain.  Neurological: Negative for headaches.  Psychiatric/Behavioral: Positive for agitation. The patient is nervous/anxious.   All other systems reviewed and are negative.    Physical Exam Triage Vital Signs ED Triage Vitals [06/25/16 1427]  Enc Vitals Group     BP 170/77     Pulse  Rate 64     Resp 18     Temp 98.3 F (36.8 C)     Temp Source Oral     SpO2 98 %     Weight      Height      Head Circumference      Peak Flow      Pain Score 8     Pain Loc      Pain Edu?      Excl. in [place]?    No data found.   Updated Vital Signs BP 170/77 (BP Location: Right Arm)   Pulse 64   Temp 98.3 F (36.8 C) (Oral)   Resp 18   LMP 06/20/2013   SpO2 98%   Visual Acuity Right Eye Distance:   Left Eye Distance:   Bilateral Distance:    Right Eye Near:   Left Eye Near:    Bilateral Near:     Physical Exam  Constitutional: She is oriented to person, place, and time. She appears  well-developed and well-nourished. No distress.  HENT:  Head: Normocephalic.  Eyes: Pupils are equal, round, and reactive to light.  Neck: Normal range of motion. Neck supple.  Cardiovascular: Normal rate.   Pulmonary/Chest: Effort normal and breath sounds normal.  Lymphadenopathy:    She has no cervical adenopathy.  Neurological: She is alert and oriented to person, place, and time. No cranial nerve deficit.  Skin: Skin is warm and dry.  Psychiatric: She has a normal mood and affect. Her behavior is normal. Judgment and thought content normal.  Nursing note and vitals reviewed.    UC Treatments / Results  Labs (all labs ordered are listed, but only abnormal results are displayed) Labs Reviewed - No data to display  EKG  EKG Interpretation None       Radiology No results found.  Procedures Procedures (including critical care time)  Medications Ordered in UC Medications - No data to display   Initial Impression / Assessment and Plan / UC Course  I have reviewed the triage vital signs and the nursing notes.  Pertinent labs & imaging results that were available during my care of the patient were reviewed by me and considered in my medical decision making (see chart for details).       Final Clinical Impressions(s) / UC Diagnoses   Final diagnoses:  Anxiety attack    New Prescriptions New Prescriptions   CLONAZEPAM (KLONOPIN) 0.5 MG TABLET    Take 1 tablet (0.5 mg total) by mouth 2 (two) times daily as needed for anxiety.     Billy Fischer, MD 06/25/16 [phone]

## 2016-06-25 NOTE — Telephone Encounter (Signed)
Communicated with our medical records department, and to date, no paperwork has been received.

## 2016-06-26 ENCOUNTER — Ambulatory Visit (INDEPENDENT_AMBULATORY_CARE_PROVIDER_SITE_OTHER): Payer: Managed Care, Other (non HMO) | Admitting: Emergency Medicine

## 2016-06-26 VITALS — BP 152/80 | HR 73 | Temp 98.1°F | Resp 18 | Ht 66.5 in | Wt 216.0 lb

## 2016-06-26 DIAGNOSIS — I10 Essential (primary) hypertension: Secondary | ICD-10-CM | POA: Diagnosis not present

## 2016-06-26 DIAGNOSIS — Z23 Encounter for immunization: Secondary | ICD-10-CM

## 2016-06-26 DIAGNOSIS — F411 Generalized anxiety disorder: Secondary | ICD-10-CM | POA: Insufficient documentation

## 2016-06-26 MED ORDER — SERTRALINE HCL 25 MG PO TABS: 25.0000 mg | ORAL_TABLET | Freq: Every day | ORAL | 1 refills | Status: DC

## 2016-06-26 NOTE — Patient Instructions (Addendum)
IF you received an x-ray today, you will receive an invoice from [facility]. Please contact [facility] at [phone] with questions or concerns regarding your invoice.   IF you received labwork today, you will receive an invoice from [place] [facility]. Please contact [facility] at [phone] with questions or concerns regarding your invoice.   Our billing staff will not be able to assist you with questions regarding bills from these companies.  You will be contacted with the lab results as soon as they are available. The fastest way to get your results is to activate your My Chart account. Instructions are located on the last page of this paperwork. If you have not heard from [place] regarding the results in 2 weeks, please contact this office.     Generalized Anxiety Disorder Generalized anxiety disorder (GAD) is a mental disorder. It interferes with life functions, including relationships, work, and school. GAD is different from normal anxiety, which everyone experiences at some point in their lives in response to specific life events and activities. Normal anxiety actually helps [place] prepare for and get through these life events and activities. Normal anxiety goes away after the event or activity is over.  GAD causes anxiety that is not necessarily related to specific events or activities. It also causes excess anxiety in proportion to specific events or activities. The anxiety associated with GAD is also difficult to control. GAD can vary from mild to severe. People with severe GAD can have intense waves of anxiety with physical symptoms (panic attacks).  SYMPTOMS The anxiety and worry associated with GAD are difficult to control. This anxiety and worry are related to many life events and activities and also occur more days than not for 6 months or longer. People with GAD also have three or more of the following symptoms (one or more in children):  Restlessness.    Fatigue.  Difficulty concentrating.   Irritability.  Muscle tension.  Difficulty sleeping or unsatisfying sleep. DIAGNOSIS GAD is diagnosed through an assessment by your health care provider. Your health care provider will ask you questions aboutyour mood,physical symptoms, and events in your life. Your health care provider may ask you about your medical history and use of alcohol or drugs, including prescription medicines. Your health care provider may also do a physical exam and blood tests. Certain medical conditions and the use of certain substances can cause symptoms similar to those associated with GAD. Your health care provider may refer you to a mental health specialist for further evaluation. TREATMENT The following therapies are usually used to treat GAD:   Medication. Antidepressant medication usually is prescribed for long-term daily control. Antianxiety medicines may be added in severe cases, especially when panic attacks occur.   Talk therapy (psychotherapy). Certain types of talk therapy can be helpful in treating GAD by providing support, education, and guidance. A form of talk therapy called cognitive behavioral therapy can teach you healthy ways to think about and react to daily life events and activities.  Stress managementtechniques. These include yoga, meditation, and exercise and can be very helpful when they are practiced regularly. A mental health specialist can help determine which treatment is best for you. Some people see improvement with one therapy. However, other people require a combination of therapies. This information is not intended to replace advice given to you by your health care provider. Make sure you discuss any questions you have with your health care provider. Document Released: 08/17/2012 Document Revised: 05/13/2014 Document Reviewed: 08/17/2012 Elsevier  Education  2017 Elsevier Inc.  

## 2016-06-26 NOTE — Progress Notes (Signed)
Ariana Cohen 56 y.o.   Chief Complaint  Patient presents with  . Anxiety    couple months     HISTORY OF PRESENT ILLNESS: This is a 56 y.o. female complaining of increased stress/anxiety x several months. Seen in ER yesterday and prescribed Klonopin but she would like something that's less sedating. Has h/o hard to control high BP and sees cardiologist for that.  HPI   Prior to Admission medications   Medication Sig Start Date End Date Taking? Authorizing Provider  cetirizine (ZYRTEC) 10 MG tablet Take 10 mg by mouth daily.   Yes Historical Provider, MD  clonazePAM (KLONOPIN) 0.5 MG tablet Take 1 tablet (0.5 mg total) by mouth 2 (two) times daily as needed for anxiety. 06/25/16  Yes Billy Fischer, MD  cloNIDine (CATAPRES - DOSED IN MG/24 HR) 0.2 mg/24hr patch Place 1 patch (0.2 mg total) onto the skin once a week. 06/04/16  Yes Pixie Casino, MD  hydrALAZINE (APRESOLINE) 100 MG tablet Take 1 tablet by mouth 3 (three) times daily. 06/09/16  Yes Historical Provider, MD  nebivolol (BYSTOLIC) 10 MG tablet Take 1 tablet (10 mg total) by mouth daily. 09/12/15  Yes Pixie Casino, MD  telmisartan-hydrochlorothiazide (MICARDIS HCT) 80-25 MG tablet Take 1 tablet by mouth daily. 04/11/16  Yes Pixie Casino, MD  sertraline (ZOLOFT) 25 MG tablet Take 1 tablet (25 mg total) by mouth daily. 06/26/16 07/26/16  Horald Pollen, MD    Allergies  Allergen Reactions  . Norvasc [Amlodipine Besylate] Swelling  . Aspirin Hives  . Minoxidil Other (See Comments)    Weight gain, extreme edema  Legs, feet, hands, lethargic      Patient Active Problem List   Diagnosis Date Noted  . OSA (obstructive sleep apnea) 06/12/2016  . Snoring 06/11/2016  . Other fatigue 03/26/2016  . Medication management 11/06/2015  . Resistant hypertension 11/06/2015  . Excessive daytime sleepiness 11/06/2015  . Murmur 08/11/2015  . PVCs (premature ventricular contractions) 08/11/2015  . Shortness of breath 08/11/2015   . Essential hypertension 08/11/2015    Past Medical History:  Diagnosis Date  . Anxiety   . Fibroids   . Hypertension     Past Surgical History:  Procedure Laterality Date  . [place]; 2000  . MYOMECTOMY  1992    Social History   Social History  . Marital status: Legally Separated    Spouse name: N/A  . Number of children: N/A  . Years of education: N/A   Occupational History  .      works Government social research officer   Social History Main Topics  . Smoking status: Never Smoker  . Smokeless tobacco: Never Used  . Alcohol use No  . Drug use: Unknown  . Sexual activity: Not on file   Other Topics Concern  . Not on file   Social History Narrative   epworth sleepiness scale = 18 (08/11/15)    Family History  Problem Relation Age of Onset  . Hypertension Mother   . Renal Disease Mother   . Hypertension Father   . Heart disease Father     also OSA  . Hypertension Maternal Grandmother   . Heart attack Maternal Grandmother   . Thyroid disease Maternal Grandmother     thyroidectomy  . Hypertension Maternal Grandfather   . Multiple myeloma Maternal Grandfather   . Sleep apnea Maternal Grandfather   . Hypertension Sister      Review of Systems  Constitutional: Negative for  chills and fever.  HENT: Negative for congestion, nosebleeds and sore throat.   Eyes: Negative for blurred vision and double vision.  Respiratory: Negative for cough, shortness of breath and wheezing.   Cardiovascular: Negative for chest pain and palpitations.  Gastrointestinal: Negative for abdominal pain, diarrhea, nausea and vomiting.  Genitourinary: Negative for dysuria and hematuria.  Musculoskeletal: Negative for myalgias and neck pain.  Skin: Negative for rash.  Neurological: Positive for headaches (ocassional). Negative for dizziness.  Psychiatric/Behavioral: Positive for depression. The patient is nervous/anxious.   All other systems reviewed and are negative.  Vitals:   06/26/16  1120  BP: (!) 152/80  Pulse: 73  Resp: 18  Temp: 98.1 F (36.7 C)     Physical Exam  Constitutional: She is oriented to person, place, and time. She appears well-developed and well-nourished.  HENT:  Head: Normocephalic and atraumatic.  Nose: Nose normal.  Mouth/Throat: Oropharynx is clear and moist. No oropharyngeal exudate.  Eyes: Conjunctivae and EOM are normal. Pupils are equal, round, and reactive to light.  Neck: Normal range of motion. Neck supple. No JVD present. No thyromegaly present.  Cardiovascular: Normal rate, regular rhythm and normal heart sounds.   Pulmonary/Chest: Effort normal and breath sounds normal.  Abdominal: Soft. There is no tenderness.  Musculoskeletal: Normal range of motion.  Lymphadenopathy:    She has no cervical adenopathy.  Neurological: She is alert and oriented to person, place, and time. No sensory deficit. She exhibits normal muscle tone.  Skin: Skin is warm and dry. Capillary refill takes less than 2 seconds.  Psychiatric: She has a normal mood and affect. Her behavior is normal.  Vitals reviewed.    ASSESSMENT & PLAN: Lucilia was seen today for anxiety.  Diagnoses and all orders for this visit:  GAD (generalized anxiety disorder) -     Ambulatory referral to Psychology  Need for prophylactic vaccination and inoculation against influenza -     Flu Vaccine QUAD 36+ mos PF IM (Fluarix & Fluzone Quad PF)  Essential hypertension  Resistant hypertension  Other orders -     sertraline (ZOLOFT) 25 MG tablet; Take 1 tablet (25 mg total) by mouth daily.   Plan is to increase Zoloft to '50mg'$  after 1 week if tolerated. Patient Instructions       IF you received an x-ray today, you will receive an invoice from [facility]. Please contact [facility] at [phone] with questions or concerns regarding your invoice.   IF you received labwork today, you will receive an invoice from [facility]. Please contact [facility] at  [phone] with questions or concerns regarding your invoice.   Our billing staff will not be able to assist you with questions regarding bills from these companies.  You will be contacted with the lab results as soon as they are available. The fastest way to get your results is to activate your My Chart account. Instructions are located on the last page of this paperwork. If you have not heard from [place] regarding the results in 2 weeks, please contact this office.     Generalized Anxiety Disorder Generalized anxiety disorder (GAD) is a mental disorder. It interferes with life functions, including relationships, work, and school. GAD is different from normal anxiety, which everyone experiences at some point in their lives in response to specific life events and activities. Normal anxiety actually helps [place] prepare for and get through these life events and activities. Normal anxiety goes away after the event or activity is over.  GAD causes  anxiety that is not necessarily related to specific events or activities. It also causes excess anxiety in proportion to specific events or activities. The anxiety associated with GAD is also difficult to control. GAD can vary from mild to severe. People with severe GAD can have intense waves of anxiety with physical symptoms (panic attacks).  SYMPTOMS The anxiety and worry associated with GAD are difficult to control. This anxiety and worry are related to many life events and activities and also occur more days than not for 6 months or longer. People with GAD also have three or more of the following symptoms (one or more in children):  Restlessness.   Fatigue.  Difficulty concentrating.   Irritability.  Muscle tension.  Difficulty sleeping or unsatisfying sleep. DIAGNOSIS GAD is diagnosed through an assessment by your health care provider. Your health care provider will ask you questions aboutyour mood,physical symptoms, and events in your life.  Your health care provider may ask you about your medical history and use of alcohol or drugs, including prescription medicines. Your health care provider may also do a physical exam and blood tests. Certain medical conditions and the use of certain substances can cause symptoms similar to those associated with GAD. Your health care provider may refer you to a mental health specialist for further evaluation. TREATMENT The following therapies are usually used to treat GAD:   Medication. Antidepressant medication usually is prescribed for long-term daily control. Antianxiety medicines may be added in severe cases, especially when panic attacks occur.   Talk therapy (psychotherapy). Certain types of talk therapy can be helpful in treating GAD by providing support, education, and guidance. A form of talk therapy called cognitive behavioral therapy can teach you healthy ways to think about and react to daily life events and activities.  Stress managementtechniques. These include yoga, meditation, and exercise and can be very helpful when they are practiced regularly. A mental health specialist can help determine which treatment is best for you. Some people see improvement with one therapy. However, other people require a combination of therapies. This information is not intended to replace advice given to you by your health care provider. Make sure you discuss any questions you have with your health care provider. Document Released: 08/17/2012 Document Revised: 05/13/2014 Document Reviewed: 08/17/2012 Elsevier Interactive Patient Education  2017 Elsevier Inc.      [name], MD [facility]

## 2016-06-27 ENCOUNTER — Telehealth: Payer: Self-pay

## 2016-06-27 NOTE — Telephone Encounter (Signed)
Pt is needing to talk with dr Leonette Monarch about her fmla paperwork for anxiety when she saw him yesterday he said he could not fill them out because he had only since her once and to talk with her cardiologist but cardiologist will not due to the fact that it is not heart related   Best [phone]

## 2016-06-27 NOTE — Telephone Encounter (Signed)
[employer] paperwork received.   Returned call to patient - patient states she is having "major anxiety attacks". She states when she was in the office on 2/6, she states MD asks her if her job causes her stress - she told him no originally. She states now it is causing her stress. Personal issues/stressors: has Alzheimer's, brother is a recovering addict and is staying with her.  Explained to patient that her cardiac issue of HTN would likely not warrant excuse (short-term disability) from work, especially given that it was not disclosed that her job made her stressed which could cause elevated BP during her las visit. Advised patient contact her PCP with request for disability, as she requests this for stress-mediated & anxiety driven concerns.   Past 2 days: Patient got med in ED 2/20 - made her sleepy Patient saw a PCP yesterday (2/21) and has Rx for antianxiety medication - Rx'ed zoloft, ref to psych She states the MD she saw yesterday would not do her paperwork, does not have enough history on her & advised her to give the paperwork to Dr. Debara Pickett  Reiterated to patient a few times that most likely Dr. Debara Pickett would NOT complete disability paperwork for her current situation as it is not cardiac in nature (other than elevated BP). She states she will contact her new PCP again with request but asked repeatedly to see if Dr. Debara Pickett could complete paperwork for her to be out of work until at least March 7 when her next appt w/PCP is. She is aware that Dr. Debara Pickett is out of the office  Message routed to MD

## 2016-06-27 NOTE — Telephone Encounter (Signed)
Follow Up    Pt calling back regarding papers for short term disability... Per pt it is very urgent. Requesting call back from nurse.

## 2016-06-27 NOTE — Telephone Encounter (Signed)
Notified patient. She will get the forms sent to [place] for Dr. Mitchel Honour to complete.

## 2016-06-27 NOTE — Telephone Encounter (Signed)
Spoke with patient.  Dx: anxiety, feelings of depression and hopelessness, situational stressors (mother needs SNF placement). Tired due to inability to sleep. Was prescribed sertraline. First dose yesterday. She didn't realize that it would not provide immediate effect.  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 Difficulty with focus/concentration. Anxious and irritable. Feels like  Tight band around her mind. Racing thoughts. Forgetting things. Impacting ability to do her job well.  Longstanding anxiety, this is the first time she has sought "professional help." "I can get it together, I know that."  Looking for 6 weeks absence from work (she has short term and long-term disability benefits with work).  If Dr. Mitchel Honour unable to complete forms based on this information, encourage patient to RTC for additional evaluation and development of short term plan.  Is currently scheduled to see Dr. Mitchel Honour 07/10/2016. Has been out of work since Tuesday 06/25/2016.

## 2016-06-27 NOTE — Telephone Encounter (Signed)
Bring in the forms. I will help her.

## 2016-06-29 ENCOUNTER — Other Ambulatory Visit: Payer: Self-pay | Admitting: Internal Medicine

## 2016-06-29 NOTE — Telephone Encounter (Signed)
She does not qualify for disability from a cardiac standpoint.  DR. Lemmie Evens

## 2016-07-01 ENCOUNTER — Telehealth: Payer: Self-pay | Admitting: Internal Medicine

## 2016-07-01 ENCOUNTER — Ambulatory Visit (INDEPENDENT_AMBULATORY_CARE_PROVIDER_SITE_OTHER): Payer: Managed Care, Other (non HMO) | Admitting: Emergency Medicine

## 2016-07-01 ENCOUNTER — Telehealth: Payer: Self-pay

## 2016-07-01 VITALS — BP 160/110 | HR 69 | Temp 98.1°F | Resp 18 | Ht 66.5 in | Wt 212.0 lb

## 2016-07-01 DIAGNOSIS — F430 Acute stress reaction: Secondary | ICD-10-CM

## 2016-07-01 DIAGNOSIS — F411 Generalized anxiety disorder: Secondary | ICD-10-CM

## 2016-07-01 DIAGNOSIS — I1A0 Resistant hypertension: Secondary | ICD-10-CM

## 2016-07-01 DIAGNOSIS — I10 Essential (primary) hypertension: Secondary | ICD-10-CM

## 2016-07-01 NOTE — Telephone Encounter (Signed)
Please DO NOT fill out her short term disabilty papers. She is going to get her primary doctor to do it. Please call,she still needs to talk to you.

## 2016-07-01 NOTE — Telephone Encounter (Signed)
Stopped in office this morning, would like a call regarding FMLA paperwork. States that it needs to be complete today for insurance purposes.

## 2016-07-01 NOTE — Patient Instructions (Signed)
     IF you received an x-ray today, you will receive an invoice from [facility]. Please contact [facility] at [phone] with questions or concerns regarding your invoice.   IF you received labwork today, you will receive an invoice from [facility]. Please contact [facility] at [phone] with questions or concerns regarding your invoice.   Our billing staff will not be able to assist you with questions regarding bills from these companies.  You will be contacted with the lab results as soon as they are available. The fastest way to get your results is to activate your My Chart account. Instructions are located on the last page of this paperwork. If you have not heard from us regarding the results in 2 weeks, please contact this office.     

## 2016-07-01 NOTE — Telephone Encounter (Signed)
LMTCB

## 2016-07-01 NOTE — Telephone Encounter (Signed)
Pixie Casino, MD at 06/29/2016 6:24 PM   Status: Signed    She does not qualify for disability from a cardiac standpoint.  DR. Lemmie Evens

## 2016-07-01 NOTE — Progress Notes (Signed)
Ariana Cohen 56 y.o.   Chief Complaint  Patient presents with  . Follow-up    fmla/not to be out of work until STD is completed   Following is a copy of telephone encounter with patient last week after office visit. It summarizes her present situation well. Started Zoloft and is tolerating it well.  HISTORY OF PRESENT ILLNESS: This is a 56 y.o. female  Note    Spoke with patient.  Dx: anxiety, feelings of depression and hopelessness, situational stressors (mother needs SNF placement). Tired due to inability to sleep. Was prescribed sertraline. First dose yesterday. She didn't realize that it would not provide immediate effect.  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 Difficulty with focus/concentration. Anxious and irritable. Feels like  Tight band around her mind. Racing thoughts. Forgetting things. Impacting ability to do her job well.  Longstanding anxiety, this is the first time she has sought "professional help." "I can get it together, I know that."  Looking for 6 weeks absence from work (she has short term and long-term disability benefits with work).  If Dr. Mitchel Honour unable to complete forms based on this information, encourage patient to RTC for additional evaluation and development of short term plan.  Is currently scheduled to see Dr. Mitchel Honour 07/10/2016. Has been out of work since Tuesday 06/25/2016.      HPI   Prior to Admission medications   Medication Sig Start Date End Date Taking? Authorizing Provider  cetirizine (ZYRTEC) 10 MG tablet Take 10 mg by mouth daily.   Yes Historical Provider, MD  clonazePAM (KLONOPIN) 0.5 MG tablet Take 1 tablet (0.5 mg total) by mouth 2 (two) times daily as needed for anxiety. 06/25/16  Yes Billy Fischer, MD  cloNIDine (CATAPRES - DOSED IN MG/24 HR) 0.2 mg/24hr patch Place 1 patch (0.2  mg total) onto the skin once a week. 06/04/16  Yes Pixie Casino, MD  hydrALAZINE (APRESOLINE) 100 MG tablet Take 1 tablet by mouth 3 (three) times daily. 06/09/16  Yes Historical Provider, MD  nebivolol (BYSTOLIC) 10 MG tablet Take 1 tablet (10 mg total) by mouth daily. 09/12/15  Yes Pixie Casino, MD  sertraline (ZOLOFT) 25 MG tablet Take 1 tablet (25 mg total) by mouth daily. 06/26/16 07/26/16 Yes [place], MD  telmisartan-hydrochlorothiazide (MICARDIS HCT) 80-25 MG tablet Take 1 tablet by mouth daily. 04/11/16  Yes Pixie Casino, MD    Allergies  Allergen Reactions  . Norvasc [Amlodipine Besylate] Swelling  . Aspirin Hives  . Minoxidil Other (See Comments)    Weight gain, extreme edema  Legs, feet, hands, lethargic      Patient Active Problem List   Diagnosis Date Noted  . GAD (generalized anxiety disorder) 06/26/2016  . OSA (obstructive sleep apnea) 06/12/2016  . Snoring 06/11/2016  . Other fatigue 03/26/2016  . Medication management 11/06/2015  . Resistant hypertension 11/06/2015  . Excessive daytime sleepiness 11/06/2015  . Murmur 08/11/2015  . PVCs (premature ventricular contractions) 08/11/2015  . Shortness of breath 08/11/2015  . Essential hypertension 08/11/2015    Past Medical History:  Diagnosis Date  . Anxiety   . Fibroids   . Hypertension     Past Surgical History:  Procedure Laterality Date  . [place]; 2000  . MYOMECTOMY  1992    Social History   Social History  . Marital  status: Legally Separated    Spouse name: N/A  . Number of children: N/A  . Years of education: N/A   Occupational History  .      works Government social research officer   Social History Main Topics  . Smoking status: Never Smoker  . Smokeless tobacco: Never Used  . Alcohol use No  . Drug use: No  . Sexual activity: Not on file   Other Topics Concern  . Not on file   Social History Narrative   epworth sleepiness scale = 18 (08/11/15)    Family History  Problem  Relation Age of Onset  . Hypertension Mother   . Renal Disease Mother   . Hypertension Father   . Heart disease Father     also OSA  . Hypertension Maternal Grandmother   . Heart attack Maternal Grandmother   . Thyroid disease Maternal Grandmother     thyroidectomy  . Hypertension Maternal Grandfather   . Multiple myeloma Maternal Grandfather   . Sleep apnea Maternal Grandfather   . Hypertension Sister      Review of Systems  Constitutional: Negative for chills and fever.  HENT: Negative for congestion, nosebleeds and sinus pain.   Eyes: Negative for blurred vision and double vision.  Respiratory: Negative for cough, shortness of breath and wheezing.   Cardiovascular: Negative for chest pain, palpitations and leg swelling.  Gastrointestinal: Negative for abdominal pain, diarrhea, nausea and vomiting.  Genitourinary: Negative for dysuria and hematuria.  Musculoskeletal: Negative for back pain, myalgias and neck pain.  Skin: Negative for rash.  Neurological: Positive for headaches.  Psychiatric/Behavioral: Positive for depression. The patient is nervous/anxious.   All other systems reviewed and are negative.   Vitals:   07/01/16 0939  BP: (!) 160/110  Pulse: 69  Resp: 18  Temp: 98.1 F (36.7 C)    Physical Exam  Constitutional: She is oriented to person, place, and time. She appears well-developed and well-nourished.  HENT:  Head: Normocephalic and atraumatic.  Nose: Nose normal.  Mouth/Throat: Oropharynx is clear and moist. No oropharyngeal exudate.  Eyes: Conjunctivae and EOM are normal. Pupils are equal, round, and reactive to light.  Neck: Normal range of motion. Neck supple. No JVD present. No thyromegaly present.  Cardiovascular: Normal rate, regular rhythm and normal heart sounds.   Pulmonary/Chest: Effort normal and breath sounds normal.  Abdominal: Soft.  Lymphadenopathy:    She has no cervical adenopathy.  Neurological: She is alert and oriented to  person, place, and time. No sensory deficit. She exhibits normal muscle tone. Coordination normal.  Skin: Skin is warm and dry. Capillary refill takes less than 2 seconds.  Psychiatric: She has a normal mood and affect. Her behavior is normal.  Vitals reviewed.    ASSESSMENT & PLAN: Pt's present situation is raising anxiety to levels that are clearly interfering with her ability to concentrate and focus on the tasks at hand. Her cognitive dysfunction will impair her ability to interact with both co-workers and customers. She needs medical intervention including  psych/psych evaluation.  Ariana Cohen was seen today for follow-up.  Diagnoses and all orders for this visit:  Acute reaction to situational stress  GAD (generalized anxiety disorder)   Advised to increase Zoloft to '50mg'$  daily. [name], MD [facility]

## 2016-07-01 NOTE — Telephone Encounter (Signed)
Records have been faxed to [facility] on 07/01/16 to fax number [phone]

## 2016-07-01 NOTE — Telephone Encounter (Signed)
Disability paperwork given back to medical records.

## 2016-07-01 NOTE — Telephone Encounter (Signed)
Patient called in 2/26 with update on paperwork and another telephone encounter was initiated. This encounter will be closed.

## 2016-07-04 ENCOUNTER — Other Ambulatory Visit: Payer: Self-pay | Admitting: Internal Medicine

## 2016-07-10 ENCOUNTER — Encounter: Payer: Self-pay | Admitting: Emergency Medicine

## 2016-07-10 ENCOUNTER — Ambulatory Visit (INDEPENDENT_AMBULATORY_CARE_PROVIDER_SITE_OTHER): Payer: Managed Care, Other (non HMO) | Admitting: Emergency Medicine

## 2016-07-10 VITALS — BP 140/70 | HR 69 | Temp 98.2°F | Resp 16 | Ht 66.5 in | Wt 213.0 lb

## 2016-07-10 DIAGNOSIS — F430 Acute stress reaction: Secondary | ICD-10-CM

## 2016-07-10 DIAGNOSIS — I10 Essential (primary) hypertension: Secondary | ICD-10-CM | POA: Diagnosis not present

## 2016-07-10 DIAGNOSIS — F32A Depression, unspecified: Secondary | ICD-10-CM

## 2016-07-10 DIAGNOSIS — F411 Generalized anxiety disorder: Secondary | ICD-10-CM

## 2016-07-10 DIAGNOSIS — R41840 Attention and concentration deficit: Secondary | ICD-10-CM | POA: Diagnosis not present

## 2016-07-10 DIAGNOSIS — F329 Major depressive disorder, single episode, unspecified: Secondary | ICD-10-CM | POA: Diagnosis not present

## 2016-07-10 MED ORDER — SERTRALINE HCL 50 MG PO TABS: 50.0000 mg | ORAL_TABLET | Freq: Every day | ORAL | 3 refills | Status: DC

## 2016-07-10 NOTE — Patient Instructions (Signed)
     IF you received an x-ray today, you will receive an invoice from [facility]. Please contact [facility] at [phone] with questions or concerns regarding your invoice.   IF you received labwork today, you will receive an invoice from [facility]. Please contact [facility] at [phone] with questions or concerns regarding your invoice.   Our billing staff will not be able to assist you with questions regarding bills from these companies.  You will be contacted with the lab results as soon as they are available. The fastest way to get your results is to activate your My Chart account. Instructions are located on the last page of this paperwork. If you have not heard from us regarding the results in 2 weeks, please contact this office.     

## 2016-07-10 NOTE — Progress Notes (Signed)
Ariana Cohen 56 y.o.   Chief Complaint  Patient presents with  . Follow-up    Depression    HISTORY OF PRESENT ILLNESS: This is a 56 y.o. female here for follow up of extreme anxiety with depression; started on Zoloft; doing better and tolerating medication well. Has some difficulty concentrating still. No new symptoms and overall feels better. HPI   Prior to Admission medications   Medication Sig Start Date End Date Taking? Authorizing Provider  cetirizine (ZYRTEC) 10 MG tablet Take 10 mg by mouth daily.   Yes Historical Provider, MD  clonazePAM (KLONOPIN) 0.5 MG tablet Take 1 tablet (0.5 mg total) by mouth 2 (two) times daily as needed for anxiety. 06/25/16  Yes Ariana Fischer, MD  cloNIDine (CATAPRES - DOSED IN MG/24 HR) 0.2 mg/24hr patch Place 1 patch (0.2 mg total) onto the skin once a week. 06/04/16  Yes Ariana Casino, MD  hydrALAZINE (APRESOLINE) 100 MG tablet TAKE 1 TABLET (100 MG TOTAL) BY MOUTH 3 (THREE) TIMES DAILY. 07/01/16  Yes Ariana Casino, MD  nebivolol (BYSTOLIC) 10 MG tablet Take 1 tablet (10 mg total) by mouth daily. 09/12/15  Yes Ariana Casino, MD  sertraline (ZOLOFT) 25 MG tablet Take 1 tablet (25 mg total) by mouth daily. 06/26/16 07/26/16 Yes Ariana [place], MD  telmisartan-hydrochlorothiazide (MICARDIS HCT) 80-25 MG tablet Take 1 tablet by mouth daily. 04/11/16  Yes Ariana Casino, MD    Allergies  Allergen Reactions  . Norvasc [Amlodipine Besylate] Swelling  . Aspirin Hives  . Minoxidil Other (See Comments)    Weight gain, extreme edema  Legs, feet, hands, lethargic      Patient Active Problem List   Diagnosis Date Noted  . Acute reaction to situational stress 07/01/2016  . GAD (generalized anxiety disorder) 06/26/2016  . OSA (obstructive sleep apnea) 06/12/2016  . Snoring 06/11/2016  . Other fatigue 03/26/2016  . Medication management 11/06/2015  . Resistant hypertension 11/06/2015  . Excessive daytime sleepiness 11/06/2015  . Murmur  08/11/2015  . PVCs (premature ventricular contractions) 08/11/2015  . Shortness of breath 08/11/2015  . Essential hypertension 08/11/2015    Past Medical History:  Diagnosis Date  . Anxiety   . Fibroids   . Hypertension     Past Surgical History:  Procedure Laterality Date  . [place]; 2000  . MYOMECTOMY  1992    Social History   Social History  . Marital status: Legally Separated    Spouse name: N/A  . Number of children: N/A  . Years of education: N/A   Occupational History  .      works Government social research officer   Social History Main Topics  . Smoking status: Never Smoker  . Smokeless tobacco: Never Used  . Alcohol use No  . Drug use: No  . Sexual activity: Not on file   Other Topics Concern  . Not on file   Social History Narrative   epworth sleepiness scale = 18 (08/11/15)    Family History  Problem Relation Age of Onset  . Hypertension Mother   . Renal Disease Mother   . Hypertension Father   . Heart disease Father     also OSA  . Hypertension Maternal Grandmother   . Heart attack Maternal Grandmother   . Thyroid disease Maternal Grandmother     thyroidectomy  . Hypertension Maternal Grandfather   . Multiple myeloma Maternal Grandfather   . Sleep apnea Maternal Grandfather   . Hypertension Sister  Review of Systems  Constitutional: Negative for chills and fever.  HENT: Negative for nosebleeds and sore throat.   Eyes: Negative for blurred vision and double vision.  Respiratory: Negative.  Negative for cough and shortness of breath.   Cardiovascular: Negative for chest pain and palpitations.  Gastrointestinal: Negative.  Negative for abdominal pain, nausea and vomiting.  Skin: Negative for rash.  Neurological: Negative.  Negative for dizziness and headaches.  Psychiatric/Behavioral: Positive for depression. The patient is nervous/anxious.   All other systems reviewed and are negative.  Vitals:   07/10/16 0915  BP: 140/70  Pulse: 69   Resp: 16  Temp: 98.2 F (36.8 C)    Physical Exam  Constitutional: She is oriented to person, place, and time. She appears well-developed and well-nourished.  HENT:  Head: Normocephalic.  Mouth/Throat: Oropharynx is clear and moist. No oropharyngeal exudate.  Eyes: Conjunctivae and EOM are normal. Pupils are equal, round, and reactive to light.  Neck: Normal range of motion. Neck supple. No JVD present. No thyromegaly present.  Cardiovascular: Normal rate, regular rhythm and normal heart sounds.   Pulmonary/Chest: Effort normal and breath sounds normal.  Abdominal: Soft. There is no tenderness.  Musculoskeletal: Normal range of motion.  Lymphadenopathy:    She has no cervical adenopathy.  Neurological: She is oriented to person, place, and time. No sensory deficit. She exhibits normal muscle tone.  Skin: Skin is warm and dry. Capillary refill takes less than 2 seconds.  Psychiatric: She has a normal mood and affect. Her behavior is normal.  Vitals reviewed.    ASSESSMENT & PLAN: Ariana Cohen was seen today for follow-up.  Diagnoses and all orders for this visit:  GAD (generalized anxiety disorder) Comments: improving Orders: -     Ambulatory referral to Psychiatry  Acute reaction to situational stress -     Ambulatory referral to Psychiatry  Essential hypertension  Lack of concentration  Depression, unspecified depression type  Other orders -     sertraline (ZOLOFT) 50 MG tablet; Take 1 tablet (50 mg total) by mouth daily.      Ariana Caroli, MD [facility]

## 2016-07-17 ENCOUNTER — Ambulatory Visit (INDEPENDENT_AMBULATORY_CARE_PROVIDER_SITE_OTHER): Payer: Managed Care, Other (non HMO) | Admitting: Cardiovascular Disease

## 2016-07-17 ENCOUNTER — Encounter: Payer: Self-pay | Admitting: Cardiovascular Disease

## 2016-07-17 VITALS — BP 180/91 | HR 61 | Ht 66.5 in | Wt 212.6 lb

## 2016-07-17 DIAGNOSIS — I10 Essential (primary) hypertension: Secondary | ICD-10-CM | POA: Diagnosis not present

## 2016-07-17 DIAGNOSIS — F418 Other specified anxiety disorders: Secondary | ICD-10-CM

## 2016-07-17 DIAGNOSIS — G4733 Obstructive sleep apnea (adult) (pediatric): Secondary | ICD-10-CM | POA: Diagnosis not present

## 2016-07-17 DIAGNOSIS — F419 Anxiety disorder, unspecified: Secondary | ICD-10-CM

## 2016-07-17 DIAGNOSIS — E669 Obesity, unspecified: Secondary | ICD-10-CM

## 2016-07-17 DIAGNOSIS — F329 Major depressive disorder, single episode, unspecified: Secondary | ICD-10-CM

## 2016-07-17 DIAGNOSIS — G4719 Other hypersomnia: Secondary | ICD-10-CM

## 2016-07-17 DIAGNOSIS — F32A Depression, unspecified: Secondary | ICD-10-CM

## 2016-07-17 MED ORDER — SPIRONOLACTONE 25 MG PO TABS: 25.0000 mg | ORAL_TABLET | Freq: Every day | ORAL | 3 refills | Status: DC

## 2016-07-17 NOTE — Progress Notes (Signed)
Cardiology Office Note    Date:  07/17/2016   ID:  Ariana Cohen, DOB July 19, 1960, MRN [id number]  PCP:  Horald Pollen, MD  Cardiologist:  Shelva Majestic, MD (sleep); Dr. Debara Pickett  Initial sleep evaluation  History of Present Illness:  Ariana Cohen is a 56 y.o. female  African-American female who was recently referred for a sleep evaluation due to excessive daytime sleepiness, fatigue, and hypertension.    Ariana Cohen had previously worked night shift for 4  years, but stopped working this shift in September 2017.  She has a history of very difficult to control hypertension, obesity, and has had issues with significant excessive daytime sleepiness.  She was referred for a diagnostic polysomnogram which was done on 05/24/2016.  This was abnormal in the sense that she had borderline sleep apnea with an AHI of 4.8 and RDI of 6.6 per hour.  AHI during supine sleep was 10.1 and consistent with mild sleep apnea.  At the time of her sleep study, she had significant excessive daytime sleepiness and her Epworth Sleepiness Scale score was 23.  There was mild oxygen desaturation to a nadir of 86% and she had loud snoring.  She had reduced REM sleep and only 2.95% with prolonged latency to REM sleep.  There were occasional PVCs noted.  She has had difficulty controlling her blood pressure and most recently has been maintained on Micardis HCT 33/29.5, Bystolic 10 mg, hydralazine 100 mg every 8 hours, and clonidine 0.2 mg 24-hour patch.  She presents for evaluation.  Over the past several weeks she has not been working due to significant anxiety and stress related to her change of jobs.  Now that she has not been working.  She feels she is sleeping a little bit better.  An Epworth Sleepiness Scale score was recalculated in the office today and this endorsed at 18 and is still consistent with significant excessive daytime sleepiness.  Epworth Sleepiness Scale: Situation   Chance of Dozing/Sleeping (0 =  never , 1 = slight chance , 2 = moderate chance , 3 = high chance )   sitting and reading 2   watching TV 2   sitting inactive in a public place 2   being a passenger in a motor vehicle for an hour or more 3   lying down in the afternoon 3   sitting and talking to someone 2   sitting quietly after lunch (no alcohol) 2   while stopped for a few minutes in traffic as the driver 2   Total Score  18    Past Medical History:  Diagnosis Date  . Anxiety   . Fibroids   . Hypertension     Past Surgical History:  Procedure Laterality Date  . [place]; 2000  . MYOMECTOMY  1992    Current Medications: Outpatient Medications Prior to Visit  Medication Sig Dispense Refill  . cetirizine (ZYRTEC) 10 MG tablet Take 10 mg by mouth daily.    . clonazePAM (KLONOPIN) 0.5 MG tablet Take 1 tablet (0.5 mg total) by mouth 2 (two) times daily as needed for anxiety. 30 tablet 0  . cloNIDine (CATAPRES - DOSED IN MG/24 HR) 0.2 mg/24hr patch Place 1 patch (0.2 mg total) onto the skin once a week. 12 patch 3  . hydrALAZINE (APRESOLINE) 100 MG tablet TAKE 1 TABLET (100 MG TOTAL) BY MOUTH 3 (THREE) TIMES DAILY. 270 tablet 1  . nebivolol (BYSTOLIC) 10 MG tablet Take 1 tablet (  10 mg total) by mouth daily. 90 tablet 3  . sertraline (ZOLOFT) 50 MG tablet Take 1 tablet (50 mg total) by mouth daily. 30 tablet 3  . telmisartan-hydrochlorothiazide (MICARDIS HCT) 80-25 MG tablet Take 1 tablet by mouth daily. 90 tablet 1  . sertraline (ZOLOFT) 25 MG tablet Take 1 tablet (25 mg total) by mouth daily. 30 tablet 1   No facility-administered medications prior to visit.      Allergies:   Norvasc [amlodipine besylate]; Aspirin; and Minoxidil   Social History   Social History  . Marital status: Legally Separated    Spouse name: N/A  . Number of children: N/A  . Years of education: N/A   Occupational History  .      works Government social research officer   Social History Main Topics  . Smoking status: Never Smoker  .  Smokeless tobacco: Never Used  . Alcohol use No  . Drug use: No  . Sexual activity: Not Asked   Other Topics Concern  . None   Social History Narrative   epworth sleepiness scale = 18 (08/11/15)    She has 2 children, a 43 year old daughter who is at [facility] in a 34 year old son.  Family History:  The patient's family history includes Heart attack in her maternal grandmother; Heart disease in her father; Hypertension in her father, maternal grandfather, maternal grandmother, mother, and sister; Multiple myeloma in her maternal grandfather; Renal Disease in her mother; Sleep apnea in her maternal grandfather; Thyroid disease in her maternal grandmother.   ROS General: Negative; No fevers, chills, or night sweats;  HEENT: Negative; No changes in vision or hearing, sinus congestion, difficulty swallowing Pulmonary: Negative; No cough, wheezing, shortness of breath, hemoptysis Cardiovascular: Negative; No chest pain, presyncope, syncope, palpitations GI: Negative; No nausea, vomiting, diarrhea, or abdominal pain GU: Negative; No dysuria, hematuria, or difficulty voiding Musculoskeletal: Negative; no myalgias, joint pain, or weakness Hematologic/Oncology: Negative; no easy bruising, bleeding Endocrine: Negative; no heat/cold intolerance; no diabetes Neuro: Negative; no changes in balance, headaches Skin: Negative; No rashes or skin lesions Psychiatric: Negative; No behavioral problems, depression Sleep: positive for loud snoring, excessive daytime sleepiness, hypersomnolence;  no bruxism, restless legs, hypnogognic hallucinations, no cataplexy Other comprehensive 14 point system review is negative.   PHYSICAL EXAM:   VS:  BP (!) 180/91   Pulse 61   Ht 5' 6.5" (1.689 m)   Wt 212 lb 9.6 oz (96.4 kg)   LMP 06/20/2013   BMI 33.80 kg/m    Wt Readings from Last 3 Encounters:  07/17/16 212 lb 9.6 oz (96.4 kg)  07/10/16 213 lb (96.6 kg)  07/01/16 212 lb (96.2 kg)    General:  Alert, oriented, no distress.  Skin: normal turgor, no rashes, warm and dry HEENT: Normocephalic, atraumatic. Pupils equal round and reactive to light; sclera anicteric; extraocular muscles intact; Fundi Arteriolar narrowing, without hemorrhages or exudates. Nose without nasal septal hypertrophy Mouth/Parynx benign; Mallinpatti scale 2 Neck: No JVD, no carotid bruits; normal carotid upstroke Lungs: clear to ausculatation and percussion; no wheezing or rales Chest wall: without tenderness to palpitation Heart: PMI not displaced, RRR, s1 s2 normal, 1/6 systolic murmur, no diastolic murmur, no rubs, gallops, thrills, or heaves Abdomen: soft, nontender; no hepatosplenomehaly, BS+; abdominal aorta nontender and not dilated by palpation. Back: no CVA tenderness Pulses 2+ Musculoskeletal: full range of motion, normal strength, no joint deformities Extremities: no clubbing cyanosis or edema, Homan's sign negative  Neurologic: grossly nonfocal; Cranial nerves grossly wnl Psychologic: Normal mood  and affect   Studies/Labs Reviewed:   EKG:  EKG is not  ordered today.   I have personally reviewed the ECG from 06/12/2016, which shows normal sinus rhythm at 67 bpm.  There are T-wave changes inferolaterally.  Recent Labs: BMP Latest Ref Rng & Units 12/14/2015 10/18/2015  Glucose 65 - 99 mg/dL 86 107(H)  BUN 7 - 25 mg/dL 11 13  Creatinine 0.50 - 1.05 mg/dL 0.82 0.75  Sodium 135 - 146 mmol/L 141 141  Potassium 3.5 - 5.3 mmol/L 3.4(L) 3.8  Chloride 98 - 110 mmol/L 103 103  CO2 20 - 31 mmol/L 30 23  Calcium 8.6 - 10.4 mg/dL 9.2 9.2     No flowsheet data found.  No flowsheet data found. No results found for: MCV No results found for: TSH No results found for: HGBA1C   BNP    Component Value Date/Time   BNP 195.0 (H) 10/18/2015 1544    ProBNP No results found for: PROBNP   Lipid Panel  No results found for: CHOL, TRIG, HDL, CHOLHDL, VLDL, LDLCALC, LDLDIRECT   RADIOLOGY: No results  found.   Additional studies/ records that were reviewed today include:  I reviewed the patient's records with Dr. Debara Pickett, ECG, sleep study.    ASSESSMENT:    1. OSA (obstructive sleep apnea)   2. Essential hypertension   3. Resistant hypertension   4. Obesity (BMI 30.0-34.9)   5. Excessive daytime sleepiness   6. Anxiety and depression      PLAN:  Ariana Cohen is a 56 year old African American female who has a history of obesity and has had resistant hypertension to her current medical regimen.  Her recent sleep study suggests increased upper airway resistance with mild obstructive sleep apnea particularly with supine sleep.  She had loud snoring and mild oxygen desaturation.  She's had significant excessive daytime sleepiness, which initially may have been contributed by her 4 years of working third shift.  However, recently she has been working the first shift, but admits to significant frustration and anxiety due to the necessity of having to meet many work-related deadlines.  I extensive discussion with her and reviewed her sleep study in detail.  We will have more significant sleep apnea that was initially found on her sleep study.  There was abnormal sleep architecture and she had reduced REM sleep, which may be contributed by her antidepressant medication.  With her significant residual daytime sleepiness, I believe she needs an evaluation for narcolepsy versus idiopathic hypersomnolence.  I specifically went over the MLST study with sleep onset REM sleep during at least 2 naps.  She denies any symptoms suggestive of cataplexy.  I discussed with her doing an MLST study for further evaluation.  At present, I am not certain that her insurance will pay for CPAP therapy, which may be of benefit to her with her mild sleep apnea identified.  I will refer her to Dr. Oneal Grout for initial orthodontist evaluation for a customized oral appliance.  With her resistant hypertension  I have  recommended the addition of spironolactone 25 mg to initiate daily with plans for follow-up bmet in 2 weeks.  I have recommended that she see Dr. Debara Pickett back for follow-up evaluation and her spironolactone dose may need to be titrated for increased aldosterone blockade and blood pressure control.  I will see her in 3-4 months for follow-up evaluation.   Medication Adjustments/Labs and Tests Ordered: Current medicines are reviewed at length with the patient today.  Concerns  regarding medicines are outlined above.  Medication changes, Labs and Tests ordered today are listed in the Patient Instructions below. Patient Instructions  Your physician has recommended that you have a MSLT sleep study. This test will be done at [place].  Your physician recommends that you return for lab work in: [place].  Your physician recommends that you schedule a follow-up appointment in: 4 weeks with Dr Debara Pickett.   Your physician recommends that you schedule a follow-up appointment in: 3-4 months with Dr Claiborne Billings.   Your physician has recommended you make the following change in your medication:   1.) start new spironalactone prescription. ( blood pressure). This has been sent to your pharmacy.  You will be referred to Dr Ron Parker for possible oral appliance to treat your sleep apnea.       Time spent 40 minutes  Signed, Shelva Majestic, MD , [facility] 07/17/2016 6:04 PM    [facility] [address] [place], [place], [place]  [address] Phone: [phone]

## 2016-07-17 NOTE — Patient Instructions (Addendum)
Your physician has recommended that you have a MSLT sleep study. This test will be done at [facility].  Your physician recommends that you return for lab work in: [place].  Your physician recommends that you schedule a follow-up appointment in: 4 weeks with Dr Debara Pickett.   Your physician recommends that you schedule a follow-up appointment in: 3-4 months with Dr Claiborne Billings.   Your physician has recommended you make the following change in your medication:   1.) start new spironalactone prescription. ( blood pressure). This has been sent to your pharmacy.  You will be referred to Dr Ron Parker for possible oral appliance to treat your sleep apnea.

## 2016-07-18 ENCOUNTER — Telehealth: Payer: Self-pay | Admitting: *Deleted

## 2016-07-18 NOTE — Telephone Encounter (Signed)
Faxed notes, demographics, sleep study, insurance information with referral to Dr Elta Guadeloupe Katz,DDS for CPAP oral appliance evaluation.

## 2016-07-30 ENCOUNTER — Ambulatory Visit (INDEPENDENT_AMBULATORY_CARE_PROVIDER_SITE_OTHER): Payer: Managed Care, Other (non HMO) | Admitting: Licensed Clinical Social Worker

## 2016-07-30 DIAGNOSIS — F321 Major depressive disorder, single episode, moderate: Secondary | ICD-10-CM | POA: Diagnosis not present

## 2016-07-31 ENCOUNTER — Ambulatory Visit (HOSPITAL_COMMUNITY): Payer: Managed Care, Other (non HMO) | Admitting: Psychiatry

## 2016-08-05 ENCOUNTER — Ambulatory Visit (INDEPENDENT_AMBULATORY_CARE_PROVIDER_SITE_OTHER): Payer: Managed Care, Other (non HMO) | Admitting: Emergency Medicine

## 2016-08-05 VITALS — BP 162/87 | HR 67 | Temp 98.6°F | Resp 17 | Ht 66.5 in | Wt 209.0 lb

## 2016-08-05 DIAGNOSIS — F430 Acute stress reaction: Secondary | ICD-10-CM | POA: Diagnosis not present

## 2016-08-05 DIAGNOSIS — F411 Generalized anxiety disorder: Secondary | ICD-10-CM | POA: Diagnosis not present

## 2016-08-05 DIAGNOSIS — R41840 Attention and concentration deficit: Secondary | ICD-10-CM | POA: Diagnosis not present

## 2016-08-05 DIAGNOSIS — F329 Major depressive disorder, single episode, unspecified: Secondary | ICD-10-CM

## 2016-08-05 DIAGNOSIS — F32A Depression, unspecified: Secondary | ICD-10-CM

## 2016-08-05 DIAGNOSIS — I10 Essential (primary) hypertension: Secondary | ICD-10-CM | POA: Diagnosis not present

## 2016-08-05 MED ORDER — SERTRALINE HCL 100 MG PO TABS: 100.0000 mg | ORAL_TABLET | Freq: Every day | ORAL | 3 refills | Status: DC

## 2016-08-05 NOTE — Progress Notes (Signed)
Ariana Cohen 55 y.o.   Chief Complaint  Patient presents with  . Follow-up    anxiety  . Depression    HISTORY OF PRESENT ILLNESS: This is a 55 y.o. female here for f/u of anxiety/depression; started on Zoloft and now up to 50mg; states she's "just a little better". Hypertensive and recently started on Aldactone; sleep studies inconclusive so they want them repeated with a different approach; still fatigued, distracted, and unable to concentrate/focus. Scheduled for Psych evaluation but not yet completed.  HPI   Prior to Admission medications   Medication Sig Start Date End Date Taking? Authorizing Provider  cetirizine (ZYRTEC) 10 MG tablet Take 10 mg by mouth daily.   Yes Historical Provider, MD  clonazePAM (KLONOPIN) 0.5 MG tablet Take 1 tablet (0.5 mg total) by mouth 2 (two) times daily as needed for anxiety. 06/25/16  Yes James D Kindl, MD  cloNIDine (CATAPRES - DOSED IN MG/24 HR) 0.2 mg/24hr patch Place 1 patch (0.2 mg total) onto the skin once a week. 06/04/16  Yes Kenneth C Hilty, MD  hydrALAZINE (APRESOLINE) 100 MG tablet TAKE 1 TABLET (100 MG TOTAL) BY MOUTH 3 (THREE) TIMES DAILY. 07/01/16  Yes Kenneth C Hilty, MD  nebivolol (BYSTOLIC) 10 MG tablet Take 1 tablet (10 mg total) by mouth daily. 09/12/15  Yes Kenneth C Hilty, MD  sertraline (ZOLOFT) 50 MG tablet Take 1 tablet (50 mg total) by mouth daily. 07/10/16  Yes Miguel Jose Sagardia, MD  spironolactone (ALDACTONE) 25 MG tablet Take 1 tablet (25 mg total) by mouth daily. 07/17/16 10/15/16 Yes Thomas A Kelly, MD  telmisartan-hydrochlorothiazide (MICARDIS HCT) 80-25 MG tablet Take 1 tablet by mouth daily. 04/11/16  Yes Kenneth C Hilty, MD    Allergies  Allergen Reactions  . Norvasc [Amlodipine Besylate] Swelling  . Aspirin Hives  . Minoxidil Other (See Comments)    Weight gain, extreme edema  Legs, feet, hands, lethargic      Patient Active Problem List   Diagnosis Date Noted  . Acute reaction to situational stress  07/01/2016  . GAD (generalized anxiety disorder) 06/26/2016  . OSA (obstructive sleep apnea) 06/12/2016  . Snoring 06/11/2016  . Other fatigue 03/26/2016  . Medication management 11/06/2015  . Resistant hypertension 11/06/2015  . Excessive daytime sleepiness 11/06/2015  . Murmur 08/11/2015  . PVCs (premature ventricular contractions) 08/11/2015  . Shortness of breath 08/11/2015  . Essential hypertension 08/11/2015    Past Medical History:  Diagnosis Date  . Anxiety   . Fibroids   . Hypertension     Past Surgical History:  Procedure Laterality Date  . CESAREAN SECTION  1998; 2000  . MYOMECTOMY  1992    Social History   Social History  . Marital status: Legally Separated    Spouse name: N/A  . Number of children: N/A  . Years of education: N/A   Occupational History  .      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 Smoking status: Never Smoker  . Smokeless tobacco: Never Used  . Alcohol use No  . Drug use: No  . Sexual activity: No   Other Topics Concern  . Not on file   Social History Narrative   epworth sleepiness scale = 18 (08/11/15)    Family History  Problem Relation Age of Onset  . Hypertension Mother   . Renal Disease Mother   . Hypertension Father   . Heart disease Father     also OSA  .   Hypertension Maternal Grandmother   . Heart attack Maternal Grandmother   . Thyroid disease Maternal Grandmother     thyroidectomy  . Hypertension Maternal Grandfather   . Multiple myeloma Maternal Grandfather   . Sleep apnea Maternal Grandfather   . Hypertension Sister      ROS   Physical Exam   ASSESSMENT & PLAN: Pt's condition slowly improving but still presents with significant cognitive concerns. Pt still c/o inability to concentrate and focus on tasks at hand. Still feels very anxious and depressed about her condition. I believe she has the coping skills to handle her condition but needs Psychiatric help. Not suicidal. Will increase Zoloft  to 188m day and re-request urgent Psychiatry evaluation. Needs Psychiatric evaluation to further assess her work readiness. Not fit for work at this time.   BKhristinawas seen today for follow-up and depression.  Diagnoses and all orders for this visit:  Acute reaction to situational stress  GAD (generalized anxiety disorder)  Essential hypertension  Lack of concentration  Depression, unspecified depression type  Other orders -     sertraline (ZOLOFT) 100 MG tablet; Take 1 tablet (100 mg total) by mouth daily.      MAgustina Caroli MD [facility]

## 2016-08-05 NOTE — Patient Instructions (Signed)
Generalized Anxiety Disorder, Adult Generalized anxiety disorder (GAD) is a mental health disorder. People with this condition constantly worry about everyday events. Unlike normal anxiety, worry related to GAD is not triggered by a specific event. These worries also do not fade or get better with time. GAD interferes with life functions, including relationships, work, and school. GAD can vary from mild to severe. People with severe GAD can have intense waves of anxiety with physical symptoms (panic attacks). What are the causes? The exact cause of GAD is not known. What increases the risk? This condition is more likely to develop in:  Women.  People who have a family history of anxiety disorders.  People who are very shy.  People who experience very stressful life events, such as the death of a loved one.  People who have a very stressful family environment. What are the signs or symptoms? People with GAD often worry excessively about many things in their lives, such as their health and family. They may also be overly concerned about:  Doing well at work.  Being on time.  Natural disasters.  Friendships. Physical symptoms of GAD include:  Fatigue.  Muscle tension or having muscle twitches.  Trembling or feeling shaky.  Being easily startled.  Feeling like your heart is pounding or racing.  Feeling out of breath or like you cannot take a deep breath.  Having trouble falling asleep or staying asleep.  Sweating.  Nausea, diarrhea, or irritable bowel syndrome (IBS).  Headaches.  Trouble concentrating or remembering facts.  Restlessness.  Irritability. How is this diagnosed? Your health care provider can diagnose GAD based on your symptoms and medical history. You will also have a physical exam. The health care provider will ask specific questions about your symptoms, including how severe they are, when they started, and if they come and go. Your health care  provider may ask you about your use of alcohol or drugs, including prescription medicines. Your health care provider may refer you to a mental health specialist for further evaluation. Your health care provider will do a thorough examination and may perform additional tests to rule out other possible causes of your symptoms. To be diagnosed with GAD, a person must have anxiety that:  Is out of his or her control.  Affects several different aspects of his or her life, such as work and relationships.  Causes distress that makes him or her unable to take part in normal activities.  Includes at least three physical symptoms of GAD, such as restlessness, fatigue, trouble concentrating, irritability, muscle tension, or sleep problems. Before your health care provider can confirm a diagnosis of GAD, these symptoms must be present more days than they are not, and they must last for six months or longer. How is this treated? The following therapies are usually used to treat GAD:  Medicine. Antidepressant medicine is usually prescribed for long-term daily control. Antianxiety medicines may be added in severe cases, especially when panic attacks occur.  Talk therapy (psychotherapy). Certain types of talk therapy can be helpful in treating GAD by providing support, education, and guidance. Options include:  Cognitive behavioral therapy (CBT). People learn coping skills and techniques to ease their anxiety. They learn to identify unrealistic or negative thoughts and behaviors and to replace them with positive ones.  Acceptance and commitment therapy (ACT). This treatment teaches people how to be mindful as a way to cope with unwanted thoughts and feelings.  Biofeedback. This process trains you to manage your body's response (  physiological response) through breathing techniques and relaxation methods. You will work with a therapist while machines are used to monitor your physical symptoms.  Stress  management techniques. These include yoga, meditation, and exercise. A mental health specialist can help determine which treatment is best for you. Some people see improvement with one type of therapy. However, other people require a combination of therapies. Follow these instructions at home:  Take over-the-counter and prescription medicines only as told by your health care provider.  Try to maintain a normal routine.  Try to anticipate stressful situations and allow extra time to manage them.  Practice any stress management or self-calming techniques as taught by your health care provider.  Do not punish yourself for setbacks or for not making progress.  Try to recognize your accomplishments, even if they are small.  Keep all follow-up visits as told by your health care provider. This is important. Contact a health care provider if:  Your symptoms do not get better.  Your symptoms get worse.  You have signs of depression, such as:  A persistently sad, cranky, or irritable mood.  Loss of enjoyment in activities that used to bring you joy.  Change in weight or eating.  Changes in sleeping habits.  Avoiding friends or family members.  Loss of energy for normal tasks.  Feelings of guilt or worthlessness. Get help right away if:  You have serious thoughts about hurting yourself or others. If you ever feel like you may hurt yourself or others, or have thoughts about taking your own life, get help right away. You can go to your nearest emergency department or call:  Your local emergency services (911 in the [place]  A suicide crisis helpline, such as the National Suicide Prevention Lifeline at 1-800-273-8255. This is open 24 hours a day. Summary  Generalized anxiety disorder (GAD) is a mental health disorder that involves worry that is not triggered by a specific event.  People with GAD often worry excessively about many things in their lives, such as their health and  family.  GAD may cause physical symptoms such as restlessness, trouble concentrating, sleep problems, frequent sweating, nausea, diarrhea, headaches, and trembling or muscle twitching.  A mental health specialist can help determine which treatment is best for you. Some people see improvement with one type of therapy. However, other people require a combination of therapies. This information is not intended to replace advice given to you by your health care provider. Make sure you discuss any questions you have with your health care provider. Document Released: 08/17/2012 Document Revised: 03/12/2016 Document Reviewed: 03/12/2016 Elsevier Interactive Patient Education  2017 [employer]  

## 2016-08-08 LAB — BASIC METABOLIC PANEL
BUN: 11 mg/dL (ref 7–25)
CO2: 28 mmol/L (ref 20–31)
Calcium: 9.3 mg/dL (ref 8.6–10.4)
Chloride: 105 mmol/L (ref 98–110)
Creat: 0.86 mg/dL (ref 0.50–1.05)
Glucose, Bld: 92 mg/dL (ref 65–99)
Potassium: 3.6 mmol/L (ref 3.5–5.3)
Sodium: 142 mmol/L (ref 135–146)

## 2016-08-12 ENCOUNTER — Encounter: Payer: Self-pay | Admitting: Internal Medicine

## 2016-08-12 ENCOUNTER — Ambulatory Visit (INDEPENDENT_AMBULATORY_CARE_PROVIDER_SITE_OTHER): Payer: Managed Care, Other (non HMO) | Admitting: Internal Medicine

## 2016-08-12 VITALS — BP 158/88 | HR 58 | Temp 92.0°F | Ht 66.6 in | Wt 211.0 lb

## 2016-08-12 DIAGNOSIS — B351 Tinea unguium: Secondary | ICD-10-CM

## 2016-08-12 DIAGNOSIS — I10 Essential (primary) hypertension: Secondary | ICD-10-CM | POA: Diagnosis not present

## 2016-08-12 DIAGNOSIS — G4733 Obstructive sleep apnea (adult) (pediatric): Secondary | ICD-10-CM | POA: Diagnosis not present

## 2016-08-12 DIAGNOSIS — E669 Obesity, unspecified: Secondary | ICD-10-CM | POA: Diagnosis not present

## 2016-08-12 NOTE — Patient Instructions (Signed)
Your physician recommends that you continue on your current medications as directed. Please refer to the Current Medication list given to you today.  Your physician recommends that you schedule a follow-up appointment in: THREE MONTHS  Dr. Debara Pickett recommends Vick's vapor rub for nail  Dr. Debara Pickett recommends [facility] Dr. Jacqualyn Posey Dr. Paulla Dolly

## 2016-08-12 NOTE — Progress Notes (Signed)
  OFFICE NOTE  Chief Complaint:  Nauseated  Primary Care Physician: Miguel Jose Sagardia, MD  HPI:  Ariana Cohen is a 55 y.o. female with a history of hypertension, leg edema, hypokalemia (possibly related to Lasix) and excessive daytime sleepiness and fatigue. She's had a number of years it difficult to control hypertension. She's been followed by several primary care providers and recently saw David Swain, who referred her to us for further evaluation. Over the years she's required a number of medications for blood pressure control. Recently she was on Norvasc which she said was helpful although had significant lower extremity swelling. Subsequently she had been put on clonidine which she said made her very sleepy in addition to her normal level of sleepiness. She is also on metoprolol and was placed on Diovan. She takes hydralazine as well. She was prescribed 50 mg 3 times a day however she only takes 100 mg daily. She works third shift and typically when she gets home sleeps for about 4 hours. She says she takes Lasix when she gets home and that often interrupts her sleep to get up to urinate. Recently she's required potassium based on laboratory work which showed some degree of hypokalemia. She generally takes the metoprolol also in the morning when she gets home from work before going to sleep as well as Diovan. She says that she does have difficulty sleeping when she tries to go to sleep. She is waking up several times either to urinate or for other reasons. Then she feels sleepy throughout the day. She oftentimes dozes off. She's noted to snore and denies any gasping or witnessed sleep apnea. Her sleepiness score today was quite high at 18. There is a strong family history of hypertension and her family both her mother and father and both grandparents. She also has a 54-year-old sister with hypertension. She denies any chest pain but does get short of breath with exertion. Her EKG today shows  PVCs for which she says she is asymptomatic. There is also evidence of LVH and nonspecific T-wave changes. She last had an echocardiogram in 2009@[facility] which showed normal LVEF of 55-60% and mild mitral regurgitation with no evidence of mitral valve prolapse. She does have a mitral murmur.  09/12/2015  Ariana Cohen returns today for follow-up. She reports continued hypertension despite changes in her medications. She is currently on hydralazine 50 mg twice a day, Toprol-XL 50 mg daily and valsartan 320 mg daily. Blood pressure today was 180/100. She's complaining of persistent swelling of her legs. She gets short of breath with activity. Her nuclear stress test was negative for ischemia however demonstrated a low EF of 45%. Echo however demonstrated normal systolic function therefore I suspect a gating abnormality. There was however stage II diastolic dysfunction and mild mitral regurgitation which is evidenced by her murmur. She does have a history of hypokalemia which could be related to Lasix or one must consider a more rare causes of hypertension such as hyperaldosteronism. There is however strong family history of hypertension. I also think she has significant fatigue and would benefit from a sleep study. This is scheduled on 10/09/2015.  11/06/2015  I saw Ariana Cohen back today in follow-up. Her blood pressure remains uncontrolled. She reports no improvement in her swelling with Lasix and to stop taking Lasix and potassium. Recently she's our pharmacist and hydralazine was increased to 100 mg twice a day. She reports strict compliance with medications although there is been questions of   this in the past. We talked about the possible dangers of me increasing her medicine if she was noncompliant and then all of a sudden started taking the medicine. That being said she insisted she was taking the medicine but blood pressure is not well controlled. Recently I did renin and  aldosterone testing which was negative.  03/26/2016  Ariana Cohen was seen back today in follow-up. Her blood pressure remains elevated. It's been very difficult to control. Despite increasing her hydralazine up to 100 mg 3 times a day, she is on max dose Bystolic and max dose Micardis HCT. She's been intolerant to amlodipine due to venous insufficiency and swelling. Previously she been on clonidine short acting pills but had significant fatigue. She likely has sleep apnea but has not yet had her sleep study. That is pending.  06/11/2016  Ariana Cohen returns today for follow-up. Her blood pressure continues to be difficult to manage. She is currently on Catapres TTS 0.2 mg patch with hydralazine 100 mg 3 times a day and Bystolic 10 mg daily. She also takes eye cart his HCTZ 80/25 mg daily. She did see Racquel of pharmacist and her hypertension clinic who recommended substituting low-dose minoxidil to decrease her clonidine however this was significantly intolerant. She developed significant swelling and side effects related to the minoxidil discontinued it. Surprisingly, blood pressure is better controlled today in the office at 138/76, however this morning she noted it was 180/111. Not clear why there are some significant differences in her pressures. She has a very new blood pressure cuff with features to export it to her cell phone and I reviewed a number of blood pressure readings with her in the office. There does seem to be a trend toward morning blood pressure elevations suggesting that she may not be getting full coverage throughout the night.  08/12/2016  Ariana Cohen was seen today for follow-up. She recently had a sleep study which did not clearly show obstructive sleep apnea however Dr. Kelly wants her to undergo an MSLT test. He added Aldactone for additional blood pressure control which is possibly improved to 158/88 today. She says her numbers are lower at home but occasionally she forgets to use  her clonidine patch. That being said she says she is very compliant with her medicines. She continues to have some lower extremity edema which I think is partially related to venous insufficiency. She has improvement with compression stockings. She was concerned about discoloration in her toenails is a problem possibly related to poor circulation, however on exam it's more consistent with onychomycosis.  PMHx:  Past Medical History:  Diagnosis Date  . Anxiety   . Fibroids   . Hypertension     Past Surgical History:  Procedure Laterality Date  . CESAREAN SECTION  1998; 2000  . MYOMECTOMY  1992    FAMHx:  Family History  Problem Relation Age of Onset  . Hypertension Mother   . Renal Disease Mother   . Hypertension Father   . Heart disease Father     also OSA  . Hypertension Maternal Grandmother   . Heart attack Maternal Grandmother   . Thyroid disease Maternal Grandmother     thyroidectomy  . Hypertension Maternal Grandfather   . Multiple myeloma Maternal Grandfather   . Sleep apnea Maternal Grandfather   . Hypertension Sister     SOCHx:   reports that she has never smoked. She has never used smokeless tobacco. She reports that she does not drink alcohol or use   drugs.  ALLERGIES:  Allergies  Allergen Reactions  . Norvasc [Amlodipine Besylate] Swelling  . Aspirin Hives  . Minoxidil Other (See Comments)    Weight gain, extreme edema  Legs, feet, hands, lethargic      ROS: Pertinent items noted in HPI and remainder of comprehensive ROS otherwise negative.  HOME MEDS: Current Outpatient Prescriptions  Medication Sig Dispense Refill  . cetirizine (ZYRTEC) 10 MG tablet Take 10 mg by mouth daily.    . clonazePAM (KLONOPIN) 0.5 MG tablet Take 1 tablet (0.5 mg total) by mouth 2 (two) times daily as needed for anxiety. 30 tablet 0  . cloNIDine (CATAPRES - DOSED IN MG/24 HR) 0.2 mg/24hr patch Place 1 patch (0.2 mg total) onto the skin once a week. 12 patch 3  . hydrALAZINE  (APRESOLINE) 100 MG tablet TAKE 1 TABLET (100 MG TOTAL) BY MOUTH 3 (THREE) TIMES DAILY. 270 tablet 1  . nebivolol (BYSTOLIC) 10 MG tablet Take 1 tablet (10 mg total) by mouth daily. 90 tablet 3  . sertraline (ZOLOFT) 100 MG tablet Take 1 tablet (100 mg total) by mouth daily. 30 tablet 3  . spironolactone (ALDACTONE) 25 MG tablet Take 1 tablet (25 mg total) by mouth daily. 30 tablet 3  . telmisartan-hydrochlorothiazide (MICARDIS HCT) 80-25 MG tablet Take 1 tablet by mouth daily. 90 tablet 1   No current facility-administered medications for this visit.     LABS/IMAGING: No results found for this or any previous visit (from the past 48 hour(s)). No results found.  WEIGHTS: Wt Readings from Last 3 Encounters:  08/12/16 211 lb (95.7 kg)  08/05/16 209 lb (94.8 kg)  07/17/16 212 lb 9.6 oz (96.4 kg)    VITALS: BP (!) 158/88   Pulse (!) 58   Temp (!) 92 F (33.3 C)   Ht 5' 6.6" (1.692 m)   Wt 211 lb (95.7 kg)   LMP 06/20/2013   BMI 33.45 kg/m   EXAM: General appearance: alert and no distress Lungs: clear to auscultation bilaterally Heart: regular rate and rhythm, S1, S2 normal, no murmur, click, rub or gallop Abdomen: soft, non-tender; bowel sounds normal; no masses,  no organomegaly Skin: Onychomycosis of the great toenails of both feet as well as several additional digital Neurologic: Grossly normal  EKG: Deferred  ASSESSMENT: 1. Uncontrolled hypertension 2. Onchomycosis 3. DOE - normal LV systolic function with grade 2 diastolic dysfunction and mild LVH 4. Murmur 5. PVCs 6. Significant daytime sleepiness with concern for sleep apnea 7. Leg edema  PLAN: 1.   Ariana Cohen has possibly had some improvement in blood pressure with addition of Aldactone although studies for secondary causes of hypertension were negative including her renin to aldosterone ratio which was normal. I would advise she continue on Aldactone. Lab work shows normal potassium and stable renal function  after adding this additional diuretic. I advised topical Vicks VapoRub for her onychomycosis and provided her with some names of local podiatrists it may be helpful if she should be interested in additional therapy such as Lamisil.  Follow-up with me in 3 months for blood pressure check.  Kenneth C. Hilty, MD, FACC Attending Cardiologist [facility]  Kenneth C Hilty 08/12/2016, 5:01 PM 

## 2016-08-19 ENCOUNTER — Telehealth: Payer: Self-pay | Admitting: Emergency Medicine

## 2016-08-19 DIAGNOSIS — R41840 Attention and concentration deficit: Secondary | ICD-10-CM

## 2016-08-19 NOTE — Telephone Encounter (Signed)
PATIENT IS REQUESTING DR. SAGARDIA TO GIVE HER A NEUROLOGICAL REFERRAL. (I ASKED HER IF SHE AND DR. SAGARDIA HAS DISCUSSED THIS AND SHE SAID NO. I TOLD HER SHE WILL PROBABLY NEED TO HAVE AN OFFICE VISIT FIRST-BUT SHE SAID SHE JUST WANTS HIM TO CALL HER). BEST PHONE [phone] (CELL) [place]

## 2016-08-19 NOTE — Telephone Encounter (Signed)
Would you make this referral based on last ov?

## 2016-08-20 ENCOUNTER — Encounter (HOSPITAL_BASED_OUTPATIENT_CLINIC_OR_DEPARTMENT_OTHER): Payer: Managed Care, Other (non HMO)

## 2016-08-21 NOTE — Telephone Encounter (Signed)
Yes; Neuro referral is OK and might help her situation.

## 2016-08-26 ENCOUNTER — Other Ambulatory Visit: Payer: Self-pay | Admitting: Emergency Medicine

## 2016-08-26 ENCOUNTER — Ambulatory Visit (INDEPENDENT_AMBULATORY_CARE_PROVIDER_SITE_OTHER): Payer: Managed Care, Other (non HMO) | Admitting: Emergency Medicine

## 2016-08-26 VITALS — BP 140/90 | HR 65 | Temp 98.6°F | Resp 16 | Ht 67.5 in | Wt 215.0 lb

## 2016-08-26 DIAGNOSIS — F329 Major depressive disorder, single episode, unspecified: Secondary | ICD-10-CM | POA: Diagnosis not present

## 2016-08-26 DIAGNOSIS — F411 Generalized anxiety disorder: Secondary | ICD-10-CM

## 2016-08-26 DIAGNOSIS — F430 Acute stress reaction: Secondary | ICD-10-CM

## 2016-08-26 DIAGNOSIS — I10 Essential (primary) hypertension: Secondary | ICD-10-CM

## 2016-08-26 DIAGNOSIS — F32A Depression, unspecified: Secondary | ICD-10-CM

## 2016-08-26 MED ORDER — PAROXETINE HCL 30 MG PO TABS: 30.0000 mg | ORAL_TABLET | Freq: Every day | ORAL | 3 refills | Status: DC

## 2016-08-26 NOTE — Telephone Encounter (Signed)
Order in , just needs your sig and reason for referral

## 2016-08-26 NOTE — Telephone Encounter (Signed)
Please order referral.  Thank you

## 2016-08-26 NOTE — Progress Notes (Signed)
Ariana Cohen 56 y.o.   Chief Complaint  Patient presents with  . Referral to Therapist    score on depression scale, score 23, per pt "Zoloft is not helping"  . history of epilepsy in the family    HISTORY OF PRESENT ILLNESS: This is a 56 y.o. female complaining of still dealing with depression; medication not helping much; also states she's unable to focus/concentrate and at times feels she's zoning out or spacing out. Has strong FHx of seizure disorder and wonders if she could be having seizures. Not suicidal and still dealing with many different issues. Saw "psychologist" recently but was told they couldn't treat her because of the complexity of her case; scheduled to see someone else early next month. No new symptoms.  HPI   Prior to Admission medications   Medication Sig Start Date End Date Taking? Authorizing Provider  cetirizine (ZYRTEC) 10 MG tablet Take 10 mg by mouth daily.   Yes Historical Provider, MD  cloNIDine (CATAPRES - DOSED IN MG/24 HR) 0.2 mg/24hr patch Place 1 patch (0.2 mg total) onto the skin once a week. 06/04/16  Yes Pixie Casino, MD  hydrALAZINE (APRESOLINE) 100 MG tablet TAKE 1 TABLET (100 MG TOTAL) BY MOUTH 3 (THREE) TIMES DAILY. 07/01/16  Yes Pixie Casino, MD  nebivolol (BYSTOLIC) 10 MG tablet Take 1 tablet (10 mg total) by mouth daily. 09/12/15  Yes Pixie Casino, MD  sertraline (ZOLOFT) 100 MG tablet Take 1 tablet (100 mg total) by mouth daily. 08/05/16  Yes [place], MD  spironolactone (ALDACTONE) 25 MG tablet Take 1 tablet (25 mg total) by mouth daily. 07/17/16 10/15/16 Yes Troy Sine, MD  telmisartan-hydrochlorothiazide (MICARDIS HCT) 80-25 MG tablet Take 1 tablet by mouth daily. 04/11/16  Yes Pixie Casino, MD  clonazePAM (KLONOPIN) 0.5 MG tablet Take 1 tablet (0.5 mg total) by mouth 2 (two) times daily as needed for anxiety. Patient not taking: Reported on 08/26/2016 06/25/16   Billy Fischer, MD    Allergies  Allergen Reactions  .  Norvasc [Amlodipine Besylate] Swelling  . Aspirin Hives  . Minoxidil Other (See Comments)    Weight gain, extreme edema  Legs, feet, hands, lethargic      Patient Active Problem List   Diagnosis Date Noted  . Obesity (BMI 30.0-34.9) 08/12/2016  . Onychomycosis 08/12/2016  . Acute reaction to situational stress 07/01/2016  . GAD (generalized anxiety disorder) 06/26/2016  . OSA (obstructive sleep apnea) 06/12/2016  . Snoring 06/11/2016  . Other fatigue 03/26/2016  . Medication management 11/06/2015  . Resistant hypertension 11/06/2015  . Excessive daytime sleepiness 11/06/2015  . Murmur 08/11/2015  . PVCs (premature ventricular contractions) 08/11/2015  . Shortness of breath 08/11/2015  . Essential hypertension 08/11/2015    Past Medical History:  Diagnosis Date  . Anxiety   . Fibroids   . Hypertension     Past Surgical History:  Procedure Laterality Date  . [place]; 2000  . MYOMECTOMY  1992    Social History   Social History  . Marital status: Legally Separated    Spouse name: N/A  . Number of children: N/A  . Years of education: N/A   Occupational History  .      works Government social research officer   Social History Main Topics  . Smoking status: Never Smoker  . Smokeless tobacco: Never Used  . Alcohol use No  . Drug use: No  . Sexual activity: No   Other Topics  Concern  . Not on file   Social History Narrative   epworth sleepiness scale = 18 (08/11/15)    Family History  Problem Relation Age of Onset  . Hypertension Mother   . Renal Disease Mother   . Hypertension Father   . Heart disease Father     also OSA  . Hypertension Maternal Grandmother   . Heart attack Maternal Grandmother   . Thyroid disease Maternal Grandmother     thyroidectomy  . Hypertension Maternal Grandfather   . Multiple myeloma Maternal Grandfather   . Sleep apnea Maternal Grandfather   . Hypertension Sister      Review of Systems  Constitutional: Negative for chills,  fever and weight loss.  HENT: Negative for congestion, ear pain and sore throat.   Eyes: Negative for blurred vision, double vision and pain.  Respiratory: Negative for cough and shortness of breath.   Cardiovascular: Negative for chest pain, palpitations and leg swelling.  Gastrointestinal: Negative for abdominal pain, nausea and vomiting.  Genitourinary: Negative for dysuria and hematuria.  Musculoskeletal: Negative for back pain, joint pain and myalgias.  Skin: Negative for rash.  Neurological: Negative for dizziness, sensory change, focal weakness and headaches.  Endo/Heme/Allergies: Negative.   Psychiatric/Behavioral: Positive for depression. Negative for substance abuse and suicidal ideas. The patient is nervous/anxious. The patient does not have insomnia.   All other systems reviewed and are negative.  Vitals:   08/26/16 0855 08/26/16 0903  BP: (!) 148/87 140/90  Pulse: 65   Resp: 16   Temp: 98.6 F (37 C)      Physical Exam  Constitutional: She is oriented to person, place, and time. She appears well-developed and well-nourished.  HENT:  Head: Normocephalic and atraumatic.  Eyes: EOM are normal. Pupils are equal, round, and reactive to light.  Neck: Normal range of motion. Neck supple.  Cardiovascular: Normal rate and regular rhythm.   Pulmonary/Chest: Effort normal and breath sounds normal.  Musculoskeletal: Normal range of motion.  Neurological: She is alert and oriented to person, place, and time. No sensory deficit. She exhibits normal muscle tone.  Skin: Skin is warm and dry. Capillary refill takes less than 2 seconds.  Psychiatric: She has a normal mood and affect. Her behavior is normal.  Vitals reviewed.    ASSESSMENT & PLAN: Will switch antidepressant at this point. Will stay with same category and make direct switch. Neurology consult to r/o seizure disorder. Work note provided. Has appointment with Psych early May.   [name] was seen today for referral  to therapist and history of epilepsy in the family.  Diagnoses and all orders for this visit:  Acute reaction to situational stress  GAD (generalized anxiety disorder)  Depression, unspecified depression type  Resistant hypertension  Other orders -     PARoxetine (PAXIL) 30 MG tablet; Take 1 tablet (30 mg total) by mouth daily.   Stop Zoloft today and start Paxil tomorrow. F/U in 3-4 weeks.  [name], MD [facility]

## 2016-08-26 NOTE — Patient Instructions (Addendum)
  Will switch antidepressant at this point. Will stay with same category and make direct switch. Neurology consult to r/o seizure disorder. Work note provided. Has appointment with Psych early May.     IF you received an x-ray today, you will receive an invoice from [facility]. Please contact [facility] at [phone] with questions or concerns regarding your invoice.   IF you received labwork today, you will receive an invoice from [place]. Please contact [facility] at [phone] with questions or concerns regarding your invoice.   Our billing staff will not be able to assist you with questions regarding bills from these companies.  You will be contacted with the lab results as soon as they are available. The fastest way to get your results is to activate your My Chart account. Instructions are located on the last page of this paperwork. If you have not heard from [place] regarding the results in 2 weeks, please contact this office.

## 2016-08-28 ENCOUNTER — Encounter: Payer: Self-pay | Admitting: Neurology

## 2016-08-29 ENCOUNTER — Other Ambulatory Visit: Payer: Self-pay | Admitting: Emergency Medicine

## 2016-08-29 DIAGNOSIS — Z1231 Encounter for screening mammogram for malignant neoplasm of breast: Secondary | ICD-10-CM

## 2016-09-06 ENCOUNTER — Encounter (HOSPITAL_COMMUNITY): Payer: Self-pay | Admitting: Psychiatry

## 2016-09-06 ENCOUNTER — Encounter (INDEPENDENT_AMBULATORY_CARE_PROVIDER_SITE_OTHER): Payer: Self-pay

## 2016-09-06 ENCOUNTER — Ambulatory Visit (INDEPENDENT_AMBULATORY_CARE_PROVIDER_SITE_OTHER): Payer: Managed Care, Other (non HMO) | Admitting: Psychiatry

## 2016-09-06 VITALS — BP 136/84 | HR 64 | Ht 66.5 in | Wt 215.0 lb

## 2016-09-06 DIAGNOSIS — Z818 Family history of other mental and behavioral disorders: Secondary | ICD-10-CM

## 2016-09-06 DIAGNOSIS — F419 Anxiety disorder, unspecified: Secondary | ICD-10-CM

## 2016-09-06 DIAGNOSIS — F410 Panic disorder [episodic paroxysmal anxiety] without agoraphobia: Secondary | ICD-10-CM

## 2016-09-06 DIAGNOSIS — F332 Major depressive disorder, recurrent severe without psychotic features: Secondary | ICD-10-CM | POA: Diagnosis not present

## 2016-09-06 DIAGNOSIS — Z79899 Other long term (current) drug therapy: Secondary | ICD-10-CM

## 2016-09-06 MED ORDER — LORAZEPAM 0.5 MG PO TABS: 0.5000 mg | ORAL_TABLET | Freq: Every day | ORAL | 0 refills | Status: DC | PRN
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
## 2016-09-09 ENCOUNTER — Ambulatory Visit (INDEPENDENT_AMBULATORY_CARE_PROVIDER_SITE_OTHER): Payer: 59 | Admitting: Psychiatry

## 2016-09-09 DIAGNOSIS — F410 Panic disorder [episodic paroxysmal anxiety] without agoraphobia: Secondary | ICD-10-CM | POA: Diagnosis not present
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
## 2016-09-16 ENCOUNTER — Other Ambulatory Visit: Payer: Self-pay | Admitting: *Deleted

## 2016-09-16 MED ORDER — SPIRONOLACTONE 25 MG PO TABS: 25.0000 mg | ORAL_TABLET | Freq: Every day | ORAL | 2 refills | Status: DC

## 2016-09-16 NOTE — Telephone Encounter (Signed)
Rx has been sent to the pharmacy electronically. ° °

## 2016-09-17 ENCOUNTER — Ambulatory Visit: Payer: Self-pay

## 2016-09-19 ENCOUNTER — Ambulatory Visit
Admission: RE | Admit: 2016-09-19 | Discharge: 2016-09-19 | Disposition: A | Discharge status: Home / Self Care | Payer: Managed Care, Other (non HMO) | Source: Ambulatory Visit | Attending: Emergency Medicine | Admitting: Emergency Medicine

## 2016-09-19 DIAGNOSIS — Z1231 Encounter for screening mammogram for malignant neoplasm of breast: Secondary | ICD-10-CM

## 2016-09-27 ENCOUNTER — Ambulatory Visit (INDEPENDENT_AMBULATORY_CARE_PROVIDER_SITE_OTHER): Payer: Managed Care, Other (non HMO) | Admitting: Psychiatry

## 2016-09-27 ENCOUNTER — Encounter (HOSPITAL_COMMUNITY): Payer: Self-pay | Admitting: Psychiatry

## 2016-09-27 DIAGNOSIS — F410 Panic disorder [episodic paroxysmal anxiety] without agoraphobia: Secondary | ICD-10-CM | POA: Diagnosis not present

## 2016-09-27 DIAGNOSIS — F419 Anxiety disorder, unspecified: Secondary | ICD-10-CM | POA: Diagnosis not present

## 2016-09-27 DIAGNOSIS — F332 Major depressive disorder, recurrent severe without psychotic features: Secondary | ICD-10-CM | POA: Diagnosis not present

## 2016-09-27 MED ORDER — LORAZEPAM 0.5 MG PO TABS: 0.5000 mg | ORAL_TABLET | Freq: Every day | ORAL | 0 refills | Status: DC | PRN

## 2016-09-27 MED ORDER — BUPROPION HCL ER (XL) 150 MG PO TB24: 150.0000 mg | ORAL_TABLET | ORAL | 0 refills | Status: DC

## 2016-09-27 NOTE — Progress Notes (Signed)
[facility] MD/PA/NP OP Progress Note  09/27/2016 10:51 AM Ariana Cohen  MRN:  [id number]  Chief Complaint:  Subjective:  I don't see any improvement with Paxil.  I still feel depressed.  HPI: Ariana Cohen came for her follow-up appointment.  She was seen first time a few weeks ago as she referred from primary care physician for depression anxiety and panic attacks.  She was taking Paxil from primary care physician and we had given a few more weeks to see efficacy.  Patient told that despite taking Paxil 30 mg she has not seen any improvement.  She still feel very anxious, nervous, depressed and having too much.  She worried about her memory.  She remember having spells of zone out and feeling numb.  She scheduled to see neurologist on 29.  Patient has family history of seizures and Alzheimer's.  She admitted continues to have panic attack.  Patient is working as a Environmental education officer and endorse lately job has been very difficult and require a lot of responsibilities.  She's been out of work since Fab[place] 19 .  She is on short-term disability.  She has been doing a lot of mistakes and easily forgetful.  Though she denies any suicidal thoughts but complained of anhedonia, hopelessness and worthlessness.  She has noticed lately that she is having jerky movements which she believed due to Paxil.  We also had given lorazepam but she lost the prescription.  She is no longer taking Klonopin.  She is scheduled to have neurology consult and she is in process of getting EEG and dementia workup.  Patient denies any agitation, anger, mania, psychosis or any hallucination.  Patient lives with his son was going to be 57 years in June.  Her daughter is in college.  Patient also had a lot of other health issues including high blood pressure, murmur, PVC and sleep apnea.  Patient also in process of getting sleep studies.  She denies drinking alcohol or using any illegal substances.  She has no tremors shakes or any EPS.  Her  energy level is low.  Her vital signs are stable.  Patient is started seeing Eloise Levels for counseling.  Visit Diagnosis:    ICD-9-CM ICD-10-CM   1. Panic attack 300.01 F41.0 LORazepam (ATIVAN) 0.5 MG tablet    Past Psychiatric History:  Patient endorse history of anxiety and depression in the past.  In the past she has taken Zoloft, Klonopin and recently Paxil but did not help.  Patient denies any history of suicidal attempt or any inpatient psychiatric treatment, mania, psychosis, self abusive behavior or any hallucination.  Past Medical History:  Past Medical History:  Diagnosis Date  . Anxiety   . Fibroids   . Hypertension     Past Surgical History:  Procedure Laterality Date  . [place]; 2000  . MYOMECTOMY  1992    Family Psychiatric History: Reviewed.  Family History:  Family History  Problem Relation Age of Onset  . Hypertension Mother   . Renal Disease Mother   . Hypertension Father   . Heart disease Father        also OSA  . Hypertension Maternal Grandmother   . Heart attack Maternal Grandmother   . Thyroid disease Maternal Grandmother        thyroidectomy  . Hypertension Maternal Grandfather   . Multiple myeloma Maternal Grandfather   . Sleep apnea Maternal Grandfather   . Hypertension Sister     Social History:  Social  History   Social History  . Marital status: Legally Separated    Spouse name: N/A  . Number of children: N/A  . Years of education: N/A   Occupational History  .      works Market researcher   Social History Main Topics  . Smoking status: Never Smoker  . Smokeless tobacco: Never Used  . Alcohol use No  . Drug use: No  . Sexual activity: No   Other Topics Concern  . Not on file   Social History Narrative   epworth sleepiness scale = 18 (08/11/15)    Allergies:  Allergies  Allergen Reactions  . Norvasc [Amlodipine Besylate] Swelling  . Aspirin Hives  . Minoxidil Other (See Comments)    Weight gain, extreme  edema  Legs, feet, hands, lethargic      Metabolic Disorder Labs: No results found for: HGBA1C, MPG No results found for: PROLACTIN No results found for: CHOL, TRIG, HDL, CHOLHDL, VLDL, LDLCALC   Current Medications: Current Outpatient Prescriptions  Medication Sig Dispense Refill  . cetirizine (ZYRTEC) 10 MG tablet Take 10 mg by mouth daily.    . cloNIDine (CATAPRES - DOSED IN MG/24 HR) 0.2 mg/24hr patch Place 1 patch (0.2 mg total) onto the skin once a week. 12 patch 3  . hydrALAZINE (APRESOLINE) 100 MG tablet TAKE 1 TABLET (100 MG TOTAL) BY MOUTH 3 (THREE) TIMES DAILY. 270 tablet 1  . LORazepam (ATIVAN) 0.5 MG tablet Take 1 tablet (0.5 mg total) by mouth daily as needed for anxiety. 15 tablet 0  . nebivolol (BYSTOLIC) 10 MG tablet Take 1 tablet (10 mg total) by mouth daily. 90 tablet 3  . PARoxetine (PAXIL) 30 MG tablet Take 1 tablet (30 mg total) by mouth daily. 30 tablet 3  . spironolactone (ALDACTONE) 25 MG tablet Take 1 tablet (25 mg total) by mouth daily. 90 tablet 2  . telmisartan-hydrochlorothiazide (MICARDIS HCT) 80-25 MG tablet Take 1 tablet by mouth daily. 90 tablet 1   No current facility-administered medications for this visit.     Neurologic: Headache: Yes Seizure: Questionable history of seizures.  She had episodes of zone out. Paresthesias: No  Musculoskeletal: Strength & Muscle Tone: within normal limits Gait & Station: normal Patient leans: N/A  Psychiatric Specialty Exam: Review of Systems  Respiratory: Negative.   Cardiovascular: Negative.   Musculoskeletal: Positive for back pain.  Neurological: Negative.        Episodes of zone out and feeling numb.  Questionable history of seizures.    Last menstrual period 06/20/2013.There is no height or weight on file to calculate BMI.  General Appearance: Casual  Eye Contact:  Good  Speech:  Slow  Volume:  Normal  Mood:  Anxious and Depressed  Affect:  Constricted  Thought Process:  Goal Directed   Orientation:  Full (Time, Place, and Person)  Thought Content: Logical and Rumination   Suicidal Thoughts:  No  Homicidal Thoughts:  No  Memory:  Immediate;   Fair Recent;   Fair Remote;   Fair  Judgement:  Good  Insight:  Good  Psychomotor Activity:  Decreased  Concentration:  Concentration: Fair and Attention Span: Fair  Recall:  [employer] of Knowledge: Good  Language: Good  Akathisia:  No  Handed:  Right  AIMS (if indicated):  0  Assets:  Communication Skills Desire for Improvement Housing  ADL's:  Intact  Cognition: Impaired,  Mild  Sleep:  Too much.     Assessment: Anxiety disorder NOS.  Panic  attacks.  Major depressive disorder, recurrent.  Plan: I review her symptoms and current medication.  I will discontinue Paxil since it is not helping her and there are times she notices having jerking movements.  She lost lorazepam prescription and night reinforced that it is a controlled substance and she needed to be careful for the future.  I will provide one more prescription but in the future if she lost the prescription then we will need police report.  I recommended to try Wellbutrin to help focus, energy, motivation, depression, and attention.  She will also keep appointment with neurologist to rule out underlying seizure disorder.  She started seeing Anderson Malta that's helping her.  We also discussed about intensive outpatient program however she need to check with insurance to get approval.  I discussed medication side effects especially Wellbutrin in the beginning may cause nausea headaches.  I recommended to call us back if she feels worsening of the symptom or anytime having suicidal thoughts or homicidal thought.  I will see her again in 4-6 weeks.  I will forward a note to Dr. Delice Lesch who she is going to see on May 29.  Jynesis Nakamura T., MD 09/27/2016, 10:51 AM

## 2016-10-01 ENCOUNTER — Encounter: Payer: Self-pay | Admitting: Neurology

## 2016-10-01 ENCOUNTER — Ambulatory Visit (INDEPENDENT_AMBULATORY_CARE_PROVIDER_SITE_OTHER): Payer: Managed Care, Other (non HMO) | Admitting: Neurology

## 2016-10-01 ENCOUNTER — Encounter (HOSPITAL_BASED_OUTPATIENT_CLINIC_OR_DEPARTMENT_OTHER): Payer: Managed Care, Other (non HMO)

## 2016-10-01 VITALS — BP 132/76 | HR 60 | Ht 66.5 in | Wt 221.0 lb

## 2016-10-01 DIAGNOSIS — R413 Other amnesia: Secondary | ICD-10-CM

## 2016-10-01 DIAGNOSIS — R404 Transient alteration of awareness: Secondary | ICD-10-CM

## 2016-10-01 NOTE — Patient Instructions (Signed)
1. Schedule MRI brain with and without contrast 2. Schedule 1-hour sleep-deprived EEG 3. Bloodwork for TSH, B12 4. Our office will call you with results. Follow-up in 4 months, call for any changes

## 2016-10-01 NOTE — Addendum Note (Signed)
Addended by: Lenny Pastel on: 10/01/2016 02:31 PM   Modules accepted: Orders

## 2016-10-01 NOTE — Progress Notes (Signed)
NEUROLOGY CONSULTATION NOTE  AVIELLE IMBERT MRN: [id number] DOB: 1960-05-24  Referring provider: Dr. [name] Primary care provider: Dr. [name]  Reason for consult:  Evaluate for seizures  Dear Dr Mitchel Honour:  Thank you for your kind referral of Ariana Cohen for consultation of the above symptoms. Although her history is well known to you, please allow me to reiterate it for the purpose of our medical record. Records and images were personally reviewed where available.  HISTORY OF PRESENT ILLNESS: This is a pleasant 56 year old right-handed woman with a history of hypertension, anxiety, presenting for evaluation of possible seizures due to report of "zoning out" and family history of epilepsy. She reports the zoning out started more than 10 years ago, she saw a neurologist and was told the test was "inconclusive." She has been dealing with a lot of health problems recently, and with more stress has noticed she has been zoning out more. She relates an instance where instead of picking up toothpaste, she put facial wash on her toothbrush instead. It tasted different, she knew she zoned out for a few seconds and was not present. She lives with her son and daughter, she has not been told of any staring or unresponsive episodes, but they tell her she is forgetful. The memory changes started a few years ago. She feels she cannot focus, reporting there is a whole lot going on. She has had anxiety "forever" but recently has been having anxiety attacks. She reports her mother was recently moved to a [place], and her brother who had been living with her for a year dealing with his issues moved out last month. She was started on Paxil but did not notice any improvement, she also had body jerks when starting the medication. She stopped it yesterday and will be starting Wellbutrin.   She denies any olfactory/gustatory hallucinations, deja vu, rising epigastric sensation, focal  numbness/tingling/weakness, myoclonic jerks. She denies any headaches, dizziness, diplopia, dysarthria/dysphagia, neck pain, bowel dysfunction. She has low back pain and occasional numbness in her hands and feet that she attributes to her BP. She has noticed some urinary urgency. She changed jobs in September 2017 and this has not worked out for her. Her mother has epilepsy and dementia. Otherwise she had a normal birth and early development.  There is no history of febrile convulsions, CNS infections such as meningitis/encephalitis, significant traumatic brain injury, neurosurgical procedures.  PAST MEDICAL HISTORY: Past Medical History:  Diagnosis Date  . Anxiety   . Fibroids   . Hypertension     PAST SURGICAL HISTORY: Past Surgical History:  Procedure Laterality Date  . [place]; 2000  . MYOMECTOMY  1992    MEDICATIONS: Current Outpatient Prescriptions on File Prior to Visit  Medication Sig Dispense Refill  . buPROPion (WELLBUTRIN XL) 150 MG 24 hr tablet Take 1 tablet (150 mg total) by mouth every morning. 30 tablet 0  . cetirizine (ZYRTEC) 10 MG tablet Take 10 mg by mouth daily.    . cloNIDine (CATAPRES - DOSED IN MG/24 HR) 0.2 mg/24hr patch Place 1 patch (0.2 mg total) onto the skin once a week. 12 patch 3  . hydrALAZINE (APRESOLINE) 100 MG tablet TAKE 1 TABLET (100 MG TOTAL) BY MOUTH 3 (THREE) TIMES DAILY. 270 tablet 1  . LORazepam (ATIVAN) 0.5 MG tablet Take 1 tablet (0.5 mg total) by mouth daily as needed for anxiety. 30 tablet 0  . nebivolol (BYSTOLIC) 10 MG tablet Take  1 tablet (10 mg total) by mouth daily. 90 tablet 3  . spironolactone (ALDACTONE) 25 MG tablet Take 1 tablet (25 mg total) by mouth daily. 90 tablet 2  . telmisartan-hydrochlorothiazide (MICARDIS HCT) 80-25 MG tablet Take 1 tablet by mouth daily. 90 tablet 1   No current facility-administered medications on file prior to visit.     ALLERGIES: Allergies  Allergen Reactions  . Norvasc  [Amlodipine Besylate] Swelling  . Aspirin Hives  . Minoxidil Other (See Comments)    Weight gain, extreme edema  Legs, feet, hands, lethargic      FAMILY HISTORY: Family History  Problem Relation Age of Onset  . Hypertension Mother   . Renal Disease Mother   . Hypertension Father   . Heart disease Father        also OSA  . Hypertension Maternal Grandmother   . Heart attack Maternal Grandmother   . Thyroid disease Maternal Grandmother        thyroidectomy  . Hypertension Maternal Grandfather   . Multiple myeloma Maternal Grandfather   . Sleep apnea Maternal Grandfather   . Hypertension Sister     SOCIAL HISTORY: Social History   Social History  . Marital status: Legally Separated    Spouse name: N/A  . Number of children: N/A  . Years of education: N/A   Occupational History  .      works Government social research officer   Social History Main Topics  . Smoking status: Never Smoker  . Smokeless tobacco: Never Used  . Alcohol use No  . Drug use: No  . Sexual activity: No   Other Topics Concern  . Not on file   Social History Narrative   epworth sleepiness scale = 18 (08/11/15)    REVIEW OF SYSTEMS: Constitutional: No fevers, chills, or sweats, no generalized fatigue, change in appetite Eyes: No visual changes, double vision, eye pain Ear, nose and throat: No hearing loss, ear pain, nasal congestion, sore throat Cardiovascular: No chest pain, palpitations Respiratory:  No shortness of breath at rest or with exertion, wheezes GastrointestinaI: No nausea, vomiting, diarrhea, abdominal pain, fecal incontinence Genitourinary:  No dysuria, urinary retention or frequency Musculoskeletal:  No neck pain, +back pain Integumentary: No rash, pruritus, skin lesions Neurological: as above Psychiatric: + depression, anxiety Endocrine: No palpitations, fatigue, diaphoresis, mood swings, change in appetite, change in weight, increased thirst Hematologic/Lymphatic:  No anemia, purpura,  petechiae. Allergic/Immunologic: no itchy/runny eyes, nasal congestion, recent allergic reactions, rashes  PHYSICAL EXAM: Vitals:   10/01/16 1050  BP: 132/76  Pulse: 60   General: No acute distress Head:  Normocephalic/atraumatic Eyes: Fundoscopic exam shows bilateral sharp discs, no vessel changes, exudates, or hemorrhages Neck: supple, no paraspinal tenderness, full range of motion Back: No paraspinal tenderness Heart: regular rate and rhythm Lungs: Clear to auscultation bilaterally. Vascular: No carotid bruits. Skin/Extremities: No rash, mild pitting edema in both LE Neurological Exam: Mental status: alert and oriented to person, place, and time, no dysarthria or aphasia, Fund of knowledge is appropriate.  Recent and remote memory are intact.  Attention and concentration are normal.    Able to name objects and repeat phrases.CDT 5/5 MMSE - Mini Mental State Exam 10/01/2016  Orientation to time 5  Orientation to Place 5  Registration 3  Attention/ Calculation 5  Recall 0  Language- name 2 objects 2  Language- repeat 1  Language- follow 3 step command 3  Language- read & follow direction 1  Write a sentence 1  Copy design 1  Total score 27   Cranial nerves: CN I: not tested CN II: pupils equal, round and reactive to light, visual fields intact, fundi unremarkable. CN III, IV, VI:  full range of motion, no nystagmus, no ptosis CN V: facial sensation intact CN VII: upper and lower face symmetric CN VIII: hearing intact to finger rub CN IX, X: gag intact, uvula midline CN XI: sternocleidomastoid and trapezius muscles intact CN XII: tongue midline Bulk & Tone: normal, no fasciculations. Motor: 5/5 throughout with no pronator drift. Sensation: intact to light touch, cold, pin, vibration and joint position sense.  No extinction to double simultaneous stimulation.  Romberg test negative Deep Tendon Reflexes: +2 on both UE, +1 both LE, no ankle clonus Plantar responses:  downgoing bilaterally Cerebellar: no incoordination on finger to nose, heel to shin. No dysdiadochokinesia Gait: narrow-based and steady, able to tandem walk adequately. Tremor: none  IMPRESSION: This is a pleasant 56 year old right-handed woman with a history of hypertension, anxiety, presenting for "zoning out" and memory problems. She is concerned about seizures due to family history of epilepsy. She has noticed worsening of symptoms as she deals with increased stress. Her neurological exam is normal, MMSE today normal 27/30. We discussed different potential causes of zoning out and memory loss, including psychological cause. MRI brain with and without contrast and a 1-hour sleep-deprived EEG will be ordered to assess for focal abnormalities that increase risk for recurrent seizures. Check TSH and B12. Continue follow-up with psychiatry and therapy. She will follow-up in 4 months and knows to call for any changes.   Thank you for allowing me to participate in the care of this patient. Please do not hesitate to call for any questions or concerns.   Ellouise Newer, M.D.  CC: Dr. Mitchel Honour

## 2016-10-02 ENCOUNTER — Ambulatory Visit (INDEPENDENT_AMBULATORY_CARE_PROVIDER_SITE_OTHER): Payer: Managed Care, Other (non HMO) | Admitting: Neurology

## 2016-10-02 ENCOUNTER — Other Ambulatory Visit: Payer: Managed Care, Other (non HMO)

## 2016-10-02 DIAGNOSIS — R413 Other amnesia: Secondary | ICD-10-CM

## 2016-10-02 DIAGNOSIS — R404 Transient alteration of awareness: Secondary | ICD-10-CM

## 2016-10-03 ENCOUNTER — Telehealth: Payer: Self-pay

## 2016-10-03 LAB — VITAMIN B12: Vitamin B-12: 343 pg/mL (ref 200–1100)

## 2016-10-03 LAB — TSH: TSH: 1.13 mIU/L

## 2016-10-03 NOTE — Telephone Encounter (Signed)
LMOM relaying message below.  

## 2016-10-03 NOTE — Telephone Encounter (Signed)
-----   Message from Cameron Sprang, MD sent at 10/03/2016  8:44 AM EDT ----- Pls let him know thyroid and B12 levels are normal. Thanks

## 2016-10-07 ENCOUNTER — Telehealth: Payer: Self-pay

## 2016-10-07 NOTE — Telephone Encounter (Signed)
-----   Message from Cameron Sprang, MD sent at 10/07/2016  9:08 AM EDT ----- Pls let her know the EEG did not show any seizure discharges. Proceed with MRI brain as scheduled. Thanks

## 2016-10-07 NOTE — Telephone Encounter (Signed)
LMOM relaying message below.  

## 2016-10-07 NOTE — Procedures (Signed)
ELECTROENCEPHALOGRAM REPORT  Date of Study: 10/02/2016  Patient's Name: Ariana Cohen MRN: [id number] Date of Birth: 1961/04/19  Referring Provider: Dr. Ellouise Newer  Clinical History: This is a 56 year old woman with episodes of zoning out and memory problems. Family history of epilepsy.  Medications: WELLBUTRIN XL  ZYRTEC CATAPRES APRESOLINE ATIVAN BYSTOLIC ALDACTONE MICARDIS HCT   Technical Summary: A multichannel digital 1-hour sleep-deprived EEG recording measured by the international 10-20 system with electrodes applied with paste and impedances below 5000 ohms performed in our laboratory with EKG monitoring in an awake and asleep patient.  Hyperventilation and photic stimulation were performed.  The digital EEG was referentially recorded, reformatted, and digitally filtered in a variety of bipolar and referential montages for optimal display.    Description: The patient is awake and asleep during the recording.  During maximal wakefulness, there is a symmetric, medium voltage 8-8.5 Hz posterior dominant rhythm that attenuates with eye opening.  There is occasional focal 4-5 Hz theta slowing seen over the left temporal region. During drowsiness and sleep, there is an increase in theta slowing of the background.  Vertex waves and symmetric sleep spindles were seen.  Hyperventilation and photic stimulation did not elicit any epileptiform abnormalities. There was an increase in focal slowing over the left temporal region during hyperventilation.  There were no epileptiform discharges or electrographic seizures seen.    EKG lead showed bradycardia at 66 bpm with occasional irregular rhythm and extrasystolic beats seen.  Impression: This 1-hour awake and asleep EEG is abnormal due to occasional focal slowing over the left temporal region.  Clinical Correlation of the above findings indicates focal cerebral dysfunction over the left temporal region suggestive of underlying  structural or physiologic abnormality. The absence of epileptiform discharges does not exclude a clinical diagnosis of epilepsy. Clinical correlation is advised.   Ellouise Newer, M.D.

## 2016-10-13 ENCOUNTER — Other Ambulatory Visit: Payer: Self-pay | Admitting: Internal Medicine

## 2016-10-16 ENCOUNTER — Encounter (HOSPITAL_BASED_OUTPATIENT_CLINIC_OR_DEPARTMENT_OTHER): Payer: Managed Care, Other (non HMO)

## 2016-10-21 ENCOUNTER — Ambulatory Visit (HOSPITAL_COMMUNITY): Payer: Self-pay | Admitting: Psychiatry

## 2016-10-21 ENCOUNTER — Encounter: Payer: Self-pay | Admitting: *Deleted

## 2016-10-21 ENCOUNTER — Ambulatory Visit: Payer: Managed Care, Other (non HMO) | Admitting: Cardiovascular Disease

## 2016-10-22 ENCOUNTER — Ambulatory Visit: Payer: Self-pay | Admitting: Neurology

## 2016-10-22 ENCOUNTER — Encounter (HOSPITAL_COMMUNITY): Payer: Self-pay | Admitting: Psychiatry

## 2016-10-22 ENCOUNTER — Ambulatory Visit (INDEPENDENT_AMBULATORY_CARE_PROVIDER_SITE_OTHER): Payer: Managed Care, Other (non HMO) | Admitting: Psychiatry

## 2016-10-22 VITALS — BP 128/73 | HR 70 | Resp 14 | Ht 66.5 in | Wt 215.6 lb

## 2016-10-22 DIAGNOSIS — F410 Panic disorder [episodic paroxysmal anxiety] without agoraphobia: Secondary | ICD-10-CM

## 2016-10-22 MED ORDER — BUPROPION HCL ER (XL) 150 MG PO TB24: 150.0000 mg | ORAL_TABLET | ORAL | 1 refills | Status: DC

## 2016-10-22 NOTE — Progress Notes (Signed)
[place] MD/PA/NP OP Progress Note  10/22/2016 3:46 PM Ariana Cohen  MRN:  [id number]  Chief Complaint:  Subjective:  I'm doing fine.  I'm thinking to start IOP.  HPI: Patient came for her follow-up appointment.  She has not started lorazepam even though she filled the prescription.  She is taking Wellbutrin which is helping her attention and focus but she is very reluctant to increase the dose of medication.  She admitted family tension with the son because he is mad at her because separation from his father .  His father lives in the town .  She continues to have episodes when she become zone out .  Patient recently seen neurologist and she had EEG and MRI.  She was told that she the spells could be due to anxiety related.  She has not discussed MRI and EEG results with her neurologist.  She admitted some time tearful and having crying spells but denies any suicidal thoughts or homicidal thought.  She is thinking seriously to do IOP.  Sometimes she feels hopeless and helpless but denies any paranoia, hallucination or any aggressive behavior.  She is concerned about her other health issues.  She has PVCs, sleep apnea, high blood pressure and murmur.  She recently had sleep studies.  Patient denies drinking alcohol or using any illegal substances.  Her energy level is fair.  She is seeing Eloise Levels.  Visit Diagnosis:    ICD-10-CM   1. Panic attack F41.0     Past Psychiatric History: Reviewed. Patient endorse history of anxiety and depression in the past.  In the past she has taken Zoloft, Klonopin and recently Paxil but did not help.  Patient denies any history of suicidal attempt or any inpatient psychiatric treatment, mania, psychosis, self abusive behavior or any hallucination.  Past Medical History:  Past Medical History:  Diagnosis Date  . Anxiety   . Fibroids   . Hypertension     Past Surgical History:  Procedure Laterality Date  . [place]; 2000  . MYOMECTOMY  1992     Family Psychiatric History: Reviewed.  Family History:  Family History  Problem Relation Age of Onset  . Hypertension Mother   . Renal Disease Mother   . Hypertension Father   . Heart disease Father        also OSA  . Hypertension Maternal Grandmother   . Heart attack Maternal Grandmother   . Thyroid disease Maternal Grandmother        thyroidectomy  . Hypertension Maternal Grandfather   . Multiple myeloma Maternal Grandfather   . Sleep apnea Maternal Grandfather   . Hypertension Sister     Social History:  Social History   Social History  . Marital status: Legally Separated    Spouse name: N/A  . Number of children: N/A  . Years of education: N/A   Occupational History  .      works Government social research officer   Social History Main Topics  . Smoking status: Never Smoker  . Smokeless tobacco: Never Used  . Alcohol use No  . Drug use: No  . Sexual activity: No   Other Topics Concern  . None   Social History Narrative   epworth sleepiness scale = 18 (08/11/15)    Allergies:  Allergies  Allergen Reactions  . Norvasc [Amlodipine Besylate] Swelling  . Aspirin Hives  . Minoxidil Other (See Comments)    Weight gain, extreme edema  Legs, feet, hands, lethargic  Metabolic Disorder Labs: No results found for: HGBA1C, MPG No results found for: PROLACTIN No results found for: CHOL, TRIG, HDL, CHOLHDL, VLDL, LDLCALC   Current Medications: Current Outpatient Prescriptions  Medication Sig Dispense Refill  . buPROPion (WELLBUTRIN XL) 150 MG 24 hr tablet Take 1 tablet (150 mg total) by mouth every morning. (Patient not taking: Reported on 10/01/2016) 30 tablet 0  . BYSTOLIC 10 MG tablet TAKE 1 TABLET (10 MG TOTAL) BY MOUTH DAILY. 90 tablet 3  . cetirizine (ZYRTEC) 10 MG tablet Take 10 mg by mouth daily.    . cloNIDine (CATAPRES - DOSED IN MG/24 HR) 0.2 mg/24hr patch Place 1 patch (0.2 mg total) onto the skin once a week. 12 patch 3  . hydrALAZINE (APRESOLINE) 100 MG tablet  TAKE 1 TABLET (100 MG TOTAL) BY MOUTH 3 (THREE) TIMES DAILY. 270 tablet 1  . spironolactone (ALDACTONE) 25 MG tablet Take 1 tablet (25 mg total) by mouth daily. 90 tablet 2  . telmisartan-hydrochlorothiazide (MICARDIS HCT) 80-25 MG tablet Take 1 tablet by mouth daily. 90 tablet 1   No current facility-administered medications for this visit.     Neurologic: Headache: No Seizure: No Paresthesias: No  Musculoskeletal: Strength & Muscle Tone: within normal limits Gait & Station: normal Patient leans: N/A  Psychiatric Specialty Exam: ROS  Blood pressure 128/73, pulse 70, resp. rate 14, height 5' 6.5" (1.689 m), weight 215 lb 9.6 oz (97.8 kg), last menstrual period 06/20/2013.Body mass index is 34.28 kg/m.  General Appearance: Casual  Eye Contact:  Good  Speech:  Clear and Coherent  Volume:  Normal  Mood:  Anxious and Depressed  Affect:  Constricted and Depressed  Thought Process:  Goal Directed  Orientation:  Full (Time, Place, and Person)  Thought Content: Rumination   Suicidal Thoughts:  No  Homicidal Thoughts:  No  Memory:  Immediate;   Good Recent;   Good Remote;   Good  Judgement:  Good  Insight:  Good  Psychomotor Activity:  Normal  Concentration:  Concentration: Good and Attention Span: Good  Recall:  Good  Fund of Knowledge: Good  Language: Good  Akathisia:  No  Handed:  Right  AIMS (if indicated):  0  Assets:  Communication Skills Desire for Improvement Housing  ADL's:  Intact  Cognition: WNL  Sleep:  fair   Assessment: Panic attacks.  Rule out major depressive disorder  Plan: Reassurance given.  Questionable compliance with Wellbutrin.  Patient does not want to increase the dose.  She has not started lorazepam.  However she agreed to try intensive outpatient program.  She like to discuss her EEG, MRI and sleep studies results with neurologist.  In the meantime I recommended to continue the Wellbutrin current dose however if she decided that we can  increase the dose to 300.  Discuss safety concern that anytime having active suicidal thoughts or homicidal thoughts and she need to call 911 or the local emergency room.  Follow-up in 4-6 weeks.   Japheth Diekman T., MD 10/22/2016, 3:46 PM

## 2016-10-24 ENCOUNTER — Telehealth: Payer: Self-pay | Admitting: Emergency Medicine

## 2016-10-24 ENCOUNTER — Ambulatory Visit: Payer: Managed Care, Other (non HMO) | Admitting: Emergency Medicine

## 2016-10-24 ENCOUNTER — Encounter: Payer: Self-pay | Admitting: Allergy

## 2016-10-24 ENCOUNTER — Ambulatory Visit (INDEPENDENT_AMBULATORY_CARE_PROVIDER_SITE_OTHER): Payer: Managed Care, Other (non HMO) | Admitting: Allergy

## 2016-10-24 VITALS — BP 120/72 | HR 67 | Temp 98.1°F | Resp 18 | Ht 65.5 in | Wt 216.2 lb

## 2016-10-24 DIAGNOSIS — J3089 Other allergic rhinitis: Secondary | ICD-10-CM | POA: Diagnosis not present

## 2016-10-24 DIAGNOSIS — L501 Idiopathic urticaria: Secondary | ICD-10-CM | POA: Diagnosis not present

## 2016-10-24 MED ORDER — RANITIDINE HCL 150 MG PO TABS: 150.0000 mg | ORAL_TABLET | Freq: Two times a day (BID) | ORAL | 3 refills | Status: DC

## 2016-10-24 MED ORDER — EPINEPHRINE 0.3 MG/0.3ML IJ SOAJ: 0.3000 mg | Freq: Once | INTRAMUSCULAR | 2 refills | Status: AC

## 2016-10-24 MED ORDER — METHYLPREDNISOLONE ACETATE 80 MG/ML IJ SUSP
80.0000 mg | Freq: Once | INTRAMUSCULAR | Status: AC
  Administered 2016-10-24: 80 mg via INTRAMUSCULAR

## 2016-10-24 MED ORDER — MONTELUKAST SODIUM 10 MG PO TABS: 10.0000 mg | ORAL_TABLET | Freq: Every day | ORAL | 3 refills | Status: DC

## 2016-10-24 NOTE — Patient Instructions (Signed)
Hives, chronic idiopathic     - will obtain labwork to assess for underlying causes of hives: CBC w diff, CMP, ESR, chronic hive panel, tryptase, environmental allergen profile     - continue your current antihistamine regime with Allegra 3 tabs in AM, Zyrtec 3 tabs in PM.   Start Singulair '10mg'$  daily and Zantac '150mg'$  twice a day.      - provided with depo-medrol injection today     - discussed initiation of Xolair month injections for management of chronic urticaria.  We will start the approval process for this.  Benefits/risks discussed and handout provided.  Will provide with AuviQ (epinephrine device) for as needed use in case of allergic reaction.    Allergic rhinitis    - as above will obtain environmental panel and take antihistamines as above.    Follow-up 2-3 months or sooner if needed

## 2016-10-24 NOTE — Telephone Encounter (Signed)
Patient needs Disability forms completed by [facility] for her memory loss issues. I could not complete the forms because they were to in detail. I will place the forms in your box 10/24/16 please return them to the FMLA/Disability box at the [employer] checkout desk within 5-7 business days. Thank you!

## 2016-10-24 NOTE — Progress Notes (Signed)
New Patient Note  RE: Ariana Cohen MRN: [id number] DOB: Dec 06, 1960 Date of Office Visit: 10/24/2016  Referring provider: No ref. provider found Primary care provider: Horald Pollen, MD  Chief Complaint: hives  History of present illness: Ariana Cohen is a 56 y.o. female presenting today for evaluation of urticaria.  She has a history of chronic idiopathic urticaria and has been seen in our office on several occasions. Her last visit was on 10/06/2013 by Dr. Verlin Fester. She reports her hives returned about 2 months ago without any known trigger. She has been having daily episodes of hive outbreaks which are extremely itchy. She describes the hives as raised that resolve within 24 hours but she continues to have crops appear. She denies any marks or bruising left behind. She has had swelling of her hands and lips with the hives. She denies any GI, respiratory or CV related symptoms with her hives. She reports she is very miserable and has been having poor sleep quality due to the hives. She was seen in urgent care in April or May but she reports she got a steroid injection but it did not help improve her symptoms. She is not sure which injection she received. She states she has been taking 3 Allegra in the morning and 3 Zyrtec in the evening as well as taking Benadryl sometimes during the day and she does have hydroxyzine that she has been taking at night. She continues to have persistent symptoms.  She denies any preceding illnesses, new medications, changes in soaps/detergents/lotions or any new foods.  She had outbreaks of hives in 2015 at her last visit and prior to that in 2012. She reports when she was pregnant she had improvement in her eyes but they will return in several months after delivery.  She also reports an allergy to perseveratives and MSG and she tries her best to avoid these products.  She reports these products will cause her to have a rash that is different from her  urticaria.   She reports having allergy symptoms of runny nose and congestion and sneezing. She has seen another allergy practice with testing and was on allergen immunotherapy for about 1-2 years.    She has no history of asthma or eczema.  Review of systems: Review of Systems  Constitutional: Negative for chills, fever and malaise/fatigue.  HENT: Negative for congestion, ear discharge, ear pain, nosebleeds, sinus pain, sore throat and tinnitus.   Eyes: Negative for discharge and redness.  Respiratory: Negative for cough, shortness of breath and wheezing.   Cardiovascular: Negative for chest pain.  Gastrointestinal: Negative for abdominal pain, diarrhea, heartburn, nausea and vomiting.  Musculoskeletal: Negative for joint pain and myalgias.  Skin: Positive for itching and rash.  Neurological: Negative for headaches.    All other systems negative unless noted above in HPI  Past medical history: Past Medical History:  Diagnosis Date  . Anxiety   . Fibroids   . Hypertension   . Urticaria     Past surgical history: Past Surgical History:  Procedure Laterality Date  . [place]; 2000  . MYOMECTOMY  1992    Family history:  Family History  Problem Relation Age of Onset  . Hypertension Mother   . Renal Disease Mother   . Hypertension Father   . Heart disease Father        also OSA  . Hypertension Maternal Grandmother   . Heart attack Maternal Grandmother   . Thyroid disease  Maternal Grandmother        thyroidectomy  . Hypertension Maternal Grandfather   . Multiple myeloma Maternal Grandfather   . Sleep apnea Maternal Grandfather   . Hypertension Sister     Social history: She lives in an apartment with carpeting with gas heating and central cooling. There are no pets in the home. There is no concern for water damage, mildew or lotions in the home. She denies a smoking history.   Medication List: Allergies as of 10/24/2016      Reactions   Norvasc  [amlodipine Besylate] Swelling   Aspirin Hives   Minoxidil Other (See Comments)   Weight gain, extreme edema  Legs, feet, hands, lethargic        Medication List       Accurate as of 10/24/16  6:02 PM. Always use your most recent med list.          buPROPion 150 MG 24 hr tablet Commonly known as:  WELLBUTRIN XL Take 1 tablet (150 mg total) by mouth every morning.   BYSTOLIC 10 MG tablet Generic drug:  nebivolol TAKE 1 TABLET (10 MG TOTAL) BY MOUTH DAILY.   cetirizine 10 MG tablet Commonly known as:  ZYRTEC Take 10 mg by mouth daily.   EPINEPHrine 0.3 mg/0.3 mL Soaj injection Commonly known as:  AUVI-Q Inject 0.3 mLs (0.3 mg total) into the muscle once.   hydrALAZINE 100 MG tablet Commonly known as:  APRESOLINE TAKE 1 TABLET (100 MG TOTAL) BY MOUTH 3 (THREE) TIMES DAILY.   montelukast 10 MG tablet Commonly known as:  SINGULAIR Take 1 tablet (10 mg total) by mouth at bedtime.   spironolactone 25 MG tablet Commonly known as:  ALDACTONE Take 1 tablet (25 mg total) by mouth daily.   telmisartan-hydrochlorothiazide 80-25 MG tablet Commonly known as:  MICARDIS HCT Take 1 tablet by mouth daily.       Known medication allergies: Allergies  Allergen Reactions  . Norvasc [Amlodipine Besylate] Swelling  . Aspirin Hives  . Minoxidil Other (See Comments)    Weight gain, extreme edema  Legs, feet, hands, lethargic       Physical examination: Blood pressure 120/72, pulse 67, temperature 98.1 F (36.7 C), resp. rate 18, height 5' 5.5" (1.664 m), weight 216 lb 3.2 oz (98.1 kg), last menstrual period 06/20/2013, SpO2 94 %.  General: Alert, interactive, in no acute distress. HEENT: PERRLA, TMs pearly gray, turbinates non-edematous without discharge, post-pharynx non erythematous. Neck: Supple without lymphadenopathy. Lungs: Clear to auscultation without wheezing, rhonchi or rales. {no increased work of breathing. CV: Normal S1, S2 without murmurs. Abdomen:  Nondistended, nontender. Skin: Warm and dry, without lesions or rashes. No urticarial lesions during this encounter Extremities:  No clubbing, cyanosis or edema. Neuro:   Grossly intact.  Diagnositics/Labs:  Allergy testing: deferred due to ongoing hives   Assessment and plan:   Urticaria, chronic idiopathic with angioedema     - She has previous history of chronic idiopathic urticaria and had been several years now without an outbreak until recently.       - will obtain labwork to assess for underlying causes of hives: CBC w diff, CMP, ESR, chronic hive panel, tryptase, environmental allergen profile     - continue your current antihistamine regime with Allegra 3 tabs in AM, Zyrtec 3 tabs in PM.   Start Singulair '10mg'$  daily and Zantac '150mg'$  twice a day.      - provided with depo-medrol injection today     -  discussed initiation of Xolair month injections for management of chronic urticaria.  We will start the approval process for this.  Benefits/risks discussed and handout provided.  Will provide with AuviQ (epinephrine device) for as needed use in case of allergic reaction.    Allergic rhinitis    - as above will obtain environmental panel and take antihistamines as above.    Follow-up 2-3 months or sooner if needed  I appreciate the opportunity to take part in [place] care. Please do not hesitate to contact me with questions.  Sincerely,   Prudy Feeler, MD Allergy/Immunology [facility]

## 2016-10-25 NOTE — Telephone Encounter (Signed)
Forms faxed to Dr Marguerite Olea office on 10/25/16 closing this encounter.

## 2016-10-25 NOTE — Telephone Encounter (Signed)
Ms. Wirtz form should be filled out by Dr. Marguerite Olea staff. He's the Psychiatrist who's been seeing her and has a better grip on her condition's present status. Thanks.

## 2016-10-28 ENCOUNTER — Ambulatory Visit (HOSPITAL_BASED_OUTPATIENT_CLINIC_OR_DEPARTMENT_OTHER): Payer: Managed Care, Other (non HMO) | Attending: Cardiovascular Disease | Admitting: Cardiovascular Disease

## 2016-10-28 VITALS — Ht 66.0 in | Wt 215.0 lb

## 2016-10-28 DIAGNOSIS — G4710 Hypersomnia, unspecified: Secondary | ICD-10-CM | POA: Diagnosis not present

## 2016-10-28 DIAGNOSIS — G4733 Obstructive sleep apnea (adult) (pediatric): Secondary | ICD-10-CM | POA: Diagnosis present

## 2016-10-28 DIAGNOSIS — R5383 Other fatigue: Secondary | ICD-10-CM | POA: Diagnosis not present

## 2016-10-28 DIAGNOSIS — I10 Essential (primary) hypertension: Secondary | ICD-10-CM

## 2016-10-28 DIAGNOSIS — G4711 Idiopathic hypersomnia with long sleep time: Secondary | ICD-10-CM

## 2016-10-31 ENCOUNTER — Other Ambulatory Visit: Payer: Self-pay

## 2016-10-31 LAB — CBC WITH DIFFERENTIAL/PLATELET
Basophils Absolute: 0 cells/uL (ref 0–200)
Basophils Relative: 0 %
Eosinophils Absolute: 99 cells/uL (ref 15–500)
Eosinophils Relative: 1 %
HCT: 44 % (ref 35.0–45.0)
Hemoglobin: 14.3 g/dL (ref 11.7–15.5)
Lymphocytes Relative: 17 %
Lymphs Abs: 1683 cells/uL (ref 850–3900)
MCH: 27.2 pg (ref 27.0–33.0)
MCHC: 32.5 g/dL (ref 32.0–36.0)
MCV: 83.7 fL (ref 80.0–100.0)
MPV: 8.8 fL (ref 7.5–12.5)
Monocytes Absolute: 594 cells/uL (ref 200–950)
Monocytes Relative: 6 %
Neutro Abs: 7524 cells/uL (ref 1500–7800)
Neutrophils Relative %: 76 %
Platelets: 332 10*3/uL (ref 140–400)
RBC: 5.26 MIL/uL — ABNORMAL HIGH (ref 3.80–5.10)
RDW: 15.6 % — ABNORMAL HIGH (ref 11.0–15.0)
WBC: 9.9 10*3/uL (ref 3.8–10.8)

## 2016-11-01 LAB — CP584 ZONE 3
Allergen, A. alternata, m6: <0.1 kU/L
Allergen, Black Locust, Acacia9: <0.1 kU/L
Allergen, C. Herbarum, M2: <0.1 kU/L
Allergen, Cedar tree, t12: 0.11 kU/L — ABNORMAL HIGH
Allergen, Comm Silver Birch, t9: <0.1 kU/L
Allergen, D pternoyssinus,d7: 2.41 kU/L — ABNORMAL HIGH
Allergen, Mucor Racemosus, M4: <0.1 kU/L
Allergen, Mulberry, t76: <0.1 kU/L
Allergen, Oak,t7: <0.1 kU/L
Allergen, P. notatum, m1: <0.1 kU/L
Allergen, S. Botryosum, m10: <0.1 kU/L
Aspergillus fumigatus, m3: <0.1 kU/L
Bahia Grass: <0.1 kU/L
Bermuda Grass: <0.1 kU/L
Box Elder IgE: <0.1 kU/L
Cat Dander: <0.1 kU/L
Cockroach: 1.66 kU/L — ABNORMAL HIGH
Common Ragweed: <0.1 kU/L
D. farinae: 2.16 kU/L — ABNORMAL HIGH
Dog Dander: <0.1 kU/L
Elm IgE: <0.1 kU/L
Johnson Grass: <0.1 kU/L
Meadow Grass: <0.1 kU/L
Nettle: <0.1 kU/L
Pecan/Hickory Tree IgE: <0.1 kU/L
Plantain: <0.1 kU/L
Rough Pigweed  IgE: 0.18 kU/L — ABNORMAL HIGH

## 2016-11-01 LAB — COMPREHENSIVE METABOLIC PANEL
ALT: 25 U/L (ref 6–29)
AST: 24 U/L (ref 10–35)
Albumin: 4 g/dL (ref 3.6–5.1)
Alkaline Phosphatase: 112 U/L (ref 33–130)
BUN: 15 mg/dL (ref 7–25)
CO2: 29 mmol/L (ref 20–31)
Calcium: 9.4 mg/dL (ref 8.6–10.4)
Chloride: 102 mmol/L (ref 98–110)
Creat: 0.79 mg/dL (ref 0.50–1.05)
Glucose, Bld: 102 mg/dL — ABNORMAL HIGH (ref 65–99)
Potassium: 3.8 mmol/L (ref 3.5–5.3)
Sodium: 140 mmol/L (ref 135–146)
Total Bilirubin: 0.7 mg/dL (ref 0.2–1.2)
Total Protein: 7.3 g/dL (ref 6.1–8.1)

## 2016-11-01 LAB — SEDIMENTATION RATE: Sed Rate: 27 mm/hr (ref 0–30)

## 2016-11-01 LAB — TRYPTASE: Tryptase: 2.5 ug/L (ref <11)

## 2016-11-07 ENCOUNTER — Telehealth: Payer: Self-pay | Admitting: *Deleted

## 2016-11-07 NOTE — Telephone Encounter (Signed)
Ariana Cohen from [name] called states that CU panel was not obtained since a red tube was not collected. Dr Nelva Bush advise on what to do if pt should go back to get lab repeated. Writer has not contacted patient until we hear back from you.

## 2016-11-07 NOTE — Telephone Encounter (Signed)
Yes she needs to return to have the CU panel drawn.   Thanks.

## 2016-11-08 LAB — CP CHRONIC URTICARIA INDEX PANEL
TSH: 0.64 mIU/L
Thyroglobulin Ab: <1 IU/mL (ref <2)
Thyroperoxidase Ab SerPl-aCnc: <1 IU/mL (ref <9)

## 2016-11-08 NOTE — Telephone Encounter (Signed)
Spoke with patient about the CU panel and she asked if we could mail her the order. Writer mailed the order.

## 2016-11-08 NOTE — Telephone Encounter (Signed)
Called [facility] to see if they still had the order instead of mailing it to patient, [facility] does have the order patient just needs to come up there and have it down.. Called patient back to inform her and left a message for her to call office.

## 2016-11-08 NOTE — Telephone Encounter (Signed)
Spoke with patient and informed her that she didn't need the order that [facility] already has it and that she just needs to go to the same one and have it done.

## 2016-11-13 ENCOUNTER — Ambulatory Visit (INDEPENDENT_AMBULATORY_CARE_PROVIDER_SITE_OTHER): Payer: Managed Care, Other (non HMO) | Admitting: Internal Medicine

## 2016-11-13 ENCOUNTER — Other Ambulatory Visit: Payer: Self-pay | Admitting: Allergy

## 2016-11-13 ENCOUNTER — Encounter: Payer: Self-pay | Admitting: Internal Medicine

## 2016-11-13 VITALS — BP 104/62 | HR 69 | Ht 66.5 in | Wt 218.0 lb

## 2016-11-13 DIAGNOSIS — I10 Essential (primary) hypertension: Secondary | ICD-10-CM | POA: Diagnosis not present

## 2016-11-13 DIAGNOSIS — E669 Obesity, unspecified: Secondary | ICD-10-CM | POA: Diagnosis not present

## 2016-11-13 DIAGNOSIS — G4719 Other hypersomnia: Secondary | ICD-10-CM

## 2016-11-13 DIAGNOSIS — R5383 Other fatigue: Secondary | ICD-10-CM

## 2016-11-13 NOTE — Patient Instructions (Signed)
Dr Debara Pickett recommends that you schedule a follow-up appointment in 1 month.  If you need a refill on your cardiac medications before your next appointment, please call your pharmacy.

## 2016-11-13 NOTE — Progress Notes (Signed)
OFFICE NOTE  Chief Complaint:  Fatigue, daytime sleepiness  Primary Care Physician: Horald Pollen, MD  HPI:  Ariana Cohen is a 56 y.o. female with a history of hypertension, leg edema, hypokalemia (possibly related to Lasix) and excessive daytime sleepiness and fatigue. She's had a number of years it difficult to control hypertension. She's been followed by several primary care providers and recently saw Marijo File, who referred her to [place] for further evaluation. Over the years she's required a number of medications for blood pressure control. Recently she was on Norvasc which she said was helpful although had significant lower extremity swelling. Subsequently she had been put on clonidine which she said made her very sleepy in addition to her normal level of sleepiness. She is also on metoprolol and was placed on Diovan. She takes hydralazine as well. She was prescribed 50 mg 3 times a day however she only takes 100 mg daily. She works third shift and typically when she gets home sleeps for about 4 hours. She says she takes Lasix when she gets home and that often interrupts her sleep to get up to urinate. Recently she's required potassium based on laboratory work which showed some degree of hypokalemia. She generally takes the metoprolol also in the morning when she gets home from work before going to sleep as well as Diovan. She says that she does have difficulty sleeping when she tries to go to sleep. She is waking up several times either to urinate or for other reasons. Then she feels sleepy throughout the day. She oftentimes dozes off. She's noted to snore and denies any gasping or witnessed sleep apnea. Her sleepiness score today was quite high at 18. There is a strong family history of hypertension and her family both her mother and father and both grandparents. She also has a 57 year old sister with hypertension. She denies any chest pain but does get short of breath with exertion.  Her EKG today shows PVCs for which she says she is asymptomatic. There is also evidence of LVH and nonspecific T-wave changes. [facility] which showed normal LVEF of 55-60% and mild mitral regurgitation with no evidence of mitral valve prolapse. She does have a mitral murmur.  09/12/2015  [name] returns today for follow-up. She reports continued hypertension despite changes in her medications. She is currently on hydralazine 50 mg twice a day, Toprol-XL 50 mg daily and valsartan 320 mg daily. Blood pressure today was 180/100. She's complaining of persistent swelling of her legs. She gets short of breath with activity. Her nuclear stress test was negative for ischemia however demonstrated a low EF of 45%. Echo however demonstrated normal systolic function therefore I suspect a gating abnormality. There was however stage II diastolic dysfunction and mild mitral regurgitation which is evidenced by her murmur. She does have a history of hypokalemia which could be related to Lasix or one must consider a more rare causes of hypertension such as hyperaldosteronism. There is however strong family history of hypertension. I also think she has significant fatigue and would benefit from a sleep study. This is scheduled on 10/09/2015.  11/06/2015  I saw [name] back today in follow-up. Her blood pressure remains uncontrolled. She reports no improvement in her swelling with Lasix and to stop taking Lasix and potassium. Recently she's our pharmacist and hydralazine was increased to 100 mg twice a day. She reports strict compliance with medications although there is been  questions of this in the past. We talked about the possible dangers of me increasing her medicine if she was noncompliant and then all of a sudden started taking the medicine. That being said she insisted she was taking the medicine but blood pressure is not well controlled. Recently I did  renin and aldosterone testing which was negative.  03/26/2016  [name] was seen back today in follow-up. Her blood pressure remains elevated. It's been very difficult to control. Despite increasing her hydralazine up to 100 mg 3 times a day, she is on max dose Bystolic and max dose Micardis HCT. She's been intolerant to amlodipine due to venous insufficiency and swelling. Previously she been on clonidine short acting pills but had significant fatigue. She likely has sleep apnea but has not yet had her sleep study. That is pending.  06/11/2016  [name] returns today for follow-up. Her blood pressure continues to be difficult to manage. She is currently on Catapres TTS 0.2 mg patch with hydralazine 100 mg 3 times a day and Bystolic 10 mg daily. She also takes eye cart his HCTZ 80/25 mg daily. She did see Racquel of pharmacist and her hypertension clinic who recommended substituting low-dose minoxidil to decrease her clonidine however this was significantly intolerant. She developed significant swelling and side effects related to the minoxidil discontinued it. Surprisingly, blood pressure is better controlled today in the office at 138/76, however this morning she noted it was 180/111. Not clear why there are some significant differences in her pressures. She has a very new blood pressure cuff with features to export it to her cell phone and I reviewed a number of blood pressure readings with her in the office. There does seem to be a trend toward morning blood pressure elevations suggesting that she may not be getting full coverage throughout the night.  08/12/2016  [name] was seen today for follow-up. She recently had a sleep study which did not clearly show obstructive sleep apnea however Dr. Claiborne Billings wants her to undergo an MSLT test. He added Aldactone for additional blood pressure control which is possibly improved to 158/88 today. She says her numbers are lower at home but occasionally she  forgets to use her clonidine patch. That being said she says she is very compliant with her medicines. She continues to have some lower extremity edema which I think is partially related to venous insufficiency. She has improvement with compression stockings. She was concerned about discoloration in her toenails is a problem possibly related to poor circulation, however on exam it's more consistent with onychomycosis.  11/13/2016  Mrs. Ledesma returns today for follow-up. I rechecked her blood pressure is low today at 104 was 62. I rechecked it again manually at 100/50. She says she just recently took her hydralazine. She says that she discontinued her Catapres patch recently due to dry mouth and has considered decreasing some of her blood pressure medicines because of low blood pressure. I wonder if this is due to some labile hypertension. She was scheduled and completed a multiple sleep latency test. Those results are still pending. She is being treated for allergies and is on high-dose antihistamines.  PMHx:  Past Medical History:  Diagnosis Date  . Anxiety   . Fibroids   . Hypertension   . Urticaria     Past Surgical History:  Procedure Laterality Date  . [place]; 2000  . MYOMECTOMY  1992    FAMHx:  Family History  Problem Relation Age  of Onset  . Hypertension Mother   . Renal Disease Mother   . Hypertension Father   . Heart disease Father        also OSA  . Hypertension Maternal Grandmother   . Heart attack Maternal Grandmother   . Thyroid disease Maternal Grandmother        thyroidectomy  . Hypertension Maternal Grandfather   . Multiple myeloma Maternal Grandfather   . Sleep apnea Maternal Grandfather   . Hypertension Sister     SOCHx:   reports that she has never smoked. She has never used smokeless tobacco. She reports that she does not drink alcohol or use drugs.  ALLERGIES:  Allergies  Allergen Reactions  . Norvasc [Amlodipine Besylate] Swelling  .  Aspirin Hives  . Minoxidil Other (See Comments)    Weight gain, extreme edema  Legs, feet, hands, lethargic      ROS: Pertinent items noted in HPI and remainder of comprehensive ROS otherwise negative.  HOME MEDS: Current Outpatient Prescriptions  Medication Sig Dispense Refill  . buPROPion (WELLBUTRIN XL) 150 MG 24 hr tablet Take 1 tablet (150 mg total) by mouth every morning. 30 tablet 1  . BYSTOLIC 10 MG tablet TAKE 1 TABLET (10 MG TOTAL) BY MOUTH DAILY. 90 tablet 3  . cetirizine (ZYRTEC) 10 MG tablet Take 10 mg by mouth daily.    . hydrALAZINE (APRESOLINE) 100 MG tablet TAKE 1 TABLET (100 MG TOTAL) BY MOUTH 3 (THREE) TIMES DAILY. 270 tablet 1  . montelukast (SINGULAIR) 10 MG tablet Take 1 tablet (10 mg total) by mouth at bedtime. 30 tablet 3  . ranitidine (ZANTAC 150 MAXIMUM STRENGTH) 150 MG tablet Take 1 tablet (150 mg total) by mouth 2 (two) times daily. 60 tablet 3  . spironolactone (ALDACTONE) 25 MG tablet Take 1 tablet (25 mg total) by mouth daily. 90 tablet 2  . telmisartan-hydrochlorothiazide (MICARDIS HCT) 80-25 MG tablet Take 1 tablet by mouth daily. 90 tablet 1   No current facility-administered medications for this visit.     LABS/IMAGING: No results found for this or any previous visit (from the past 48 hour(s)). No results found.  WEIGHTS: Wt Readings from Last 3 Encounters:  11/13/16 218 lb (98.9 kg)  10/28/16 215 lb (97.5 kg)  10/24/16 216 lb 3.2 oz (98.1 kg)    VITALS: BP 104/62   Pulse 69   Ht 5' 6.5" (1.689 m)   Wt 218 lb (98.9 kg)   LMP 06/20/2013   BMI 34.66 kg/m   EXAM: General appearance: alert and no distress Lungs: clear to auscultation bilaterally Heart: regular rate and rhythm, S1, S2 normal, no murmur, click, rub or gallop Abdomen: soft, non-tender; bowel sounds normal; no masses,  no organomegaly Skin: Onychomycosis of the great toenails of both feet as well as several additional digital Neurologic: Grossly normal  EKG: Normal sinus  rhythm at 69, inferior T-wave inversions-personally reviewed  ASSESSMENT: 1. Labile hypertension 2. Onchomycosis 3. DOE - normal LV systolic function with grade 2 diastolic dysfunction and mild LVH 4. Murmur 5. PVCs 6. Significant daytime sleepiness with concern for sleep apnea 7. Leg edema  PLAN: 1.   Mrs. Prothero has had labile blood pressures and is very low today. Is not clear that this is a cause of her fatigue, however. Were still awaiting results of her multiple sleep latency test. She did not have any evidence for sleep apnea. She discontinue clonidine on her own. We'll continue her current medicines but advised her to follow  her blood pressures at least twice daily and keep a record. We'll plan to see her back in one month and we'll determine adjustments to her blood pressure medications at that time.  Pixie Casino, MD, [facility] Attending Cardiologist [facility] 11/13/2016, 3:09 PM

## 2016-11-14 NOTE — Addendum Note (Signed)
Addended by: Venetia Maxon on: 11/14/2016 04:35 PM   Modules accepted: Orders

## 2016-11-15 ENCOUNTER — Ambulatory Visit
Admission: RE | Admit: 2016-11-15 | Discharge: 2016-11-15 | Disposition: A | Discharge status: Home / Self Care | Payer: Managed Care, Other (non HMO) | Source: Ambulatory Visit | Attending: Neurology | Admitting: Neurology

## 2016-11-15 DIAGNOSIS — R404 Transient alteration of awareness: Secondary | ICD-10-CM

## 2016-11-15 DIAGNOSIS — R413 Other amnesia: Secondary | ICD-10-CM

## 2016-11-20 ENCOUNTER — Telehealth: Payer: Self-pay | Admitting: Cardiovascular Disease

## 2016-11-20 LAB — HISTAMINE REL. (CHRONIC URTICARIA): Histamine Release: <16 % (ref <16)

## 2016-11-20 NOTE — Telephone Encounter (Signed)
Returned call to patient, left message informing study needs to be reviewed and would send Dr. Claiborne Billings message to make him aware.  Advised to call with further questions or concerns.

## 2016-11-20 NOTE — Telephone Encounter (Signed)
Ariana Cohen is calling because she is calling to find out the results of her Sleep Apnea test that she had about a month ago . Please call

## 2016-11-22 NOTE — Procedures (Signed)
    Patient Name: Ariana Cohen, Ariana Cohen Date: 10/28/2016 Gender: Female D.O.B: February 26, 1961 Age (years): 37 Referring Provider: Shelva Majestic MD, ABSM Height (inches): 66 Interpreting Physician: Shelva Majestic MD, ABSM Weight (lbs): 215 RPSGT: Jacolyn Reedy BMI: 35 MRN: [id number] Neck Size: 15.00  CLINICAL INFORMATION Sleep Study Type: MSLT  The patient was referred to the sleep center for evaluation of daytime sleepiness.  Epworth Sleepiness Score: 23  SLEEP STUDY TECHNIQUE A Multiple Sleep Latency Test was performed after an overnight polysomnogram according to the AASM scoring manual v2.3 (April 2016) and clinical guidelines. Five nap opportunities occurred over the course of the test which followed an overnight polysomnogram. The channels recorded and monitored were frontal, central, and occipital electroencephalography (EEG), right and left electrooculogram (EOG), chin electromyography (EMG), and electrocardiogram (EKG).  MEDICATIONS * buPROPion (WELLBUTRIN XL) 150 MG 24 hr tablet Take 1 tablet (150 mg total) by mouth every morning. 30 tablet 1 * BYSTOLIC 10 MG tablet TAKE 1 TABLET (10 MG TOTAL) BY MOUTH DAILY. 90 tablet 3 * cetirizine (ZYRTEC) 10 MG tablet Take 10 mg by mouth daily.   * hydrALAZINE (APRESOLINE) 100 MG tablet TAKE 1 TABLET (100 MG TOTAL) BY MOUTH 3 (THREE) TIMES DAILY. 270 tablet 1 * montelukast (SINGULAIR) 10 MG tablet Take 1 tablet (10 mg total) by mouth at bedtime. 30 tablet 3 * ranitidine (ZANTAC 150 MAXIMUM STRENGTH) 150 MG tablet Take 1 tablet (150 mg total) by mouth 2 (two) times daily. 60 tablet 3 * spironolactone (ALDACTONE) 25 MG tablet Take 1 tablet (25 mg total) by mouth daily. 90 tablet 2 * telmisartan-hydrochlorothiazide (MICARDIS HCT) 80-25 MG tablet Take 1 tablet by mouth daily. 90 tablet 1   Medications administered by patient during sleep study : No sleep medicine administered.  IMPRESSIONS - Total number of naps attempted: 5.00 . Total  number of naps with sleep attained: 5. The Mean Sleep Latency was 01:18 minutes. There were sleep-onset REM periods. - The patient appears to have pathologic sleepiness, evidenced by a short mean sleep latency (8 minutes or less) on this MSLT. - One nap (nap 5) with sleep onset REMs. No evidence for narcolepsy or cataplexy.  - Occasional unifocal PVCs were noted on nap 3, the patient had more frequent PVCs with the ventricular bigeminy  DIAGNOSIS - Pathologic Sleepiness (- [G47.10 ICD-10]) - Idiopathic Hypersomnia (327.11 [G47.11 ICD-10]) - Premature ventricular beats with transient ventricular bigeminy  RECOMMENDATIONS - Findings are suggestive of Idiopathic Hypersomnia.  Consider an initial trial of Provigil or Nuvigil. - Return for follow up to evaluate other causes of excessive daytime sleepiness.  [Electronically signed] 11/22/2016 06:33 PM  Shelva Majestic MD, [facility], [place], American Board of Sleep Medicine   NPI: [id number]  [place] PH: [phone]   FX: [phone] [place]

## 2016-11-27 NOTE — Telephone Encounter (Signed)
The sleep evaluation was read

## 2016-11-29 ENCOUNTER — Telehealth: Payer: Self-pay | Admitting: Internal Medicine

## 2016-11-29 NOTE — Telephone Encounter (Signed)
New message    Pt is calling stating that she is still waiting on results from sleep study. Please call.

## 2016-11-29 NOTE — Telephone Encounter (Signed)
RECOMMENDATIONS - Findings are suggestive of Idiopathic Hypersomnia.  Consider an initial trial of Provigil or Nuvigil. - Return for follow up to evaluate other causes of excessive daytime sleepiness.   Patient aware and agrees w/sleep clinic eval. Message sent to scheduler

## 2016-12-02 ENCOUNTER — Encounter: Payer: Self-pay | Admitting: *Deleted

## 2016-12-10 ENCOUNTER — Telehealth: Payer: Self-pay | Admitting: *Deleted

## 2016-12-10 NOTE — Telephone Encounter (Signed)
Oh wow... She never started.   Yes I would assume the same...she must be better with oral medications.

## 2016-12-10 NOTE — Telephone Encounter (Signed)
Patient has upcoming appt with Dr. Claiborne Billings 9/27.

## 2016-12-10 NOTE — Telephone Encounter (Signed)
I have left messages for patient on 3 different occasions regarding starting Xolair for her urticaria. On 11/22/16, 11/28/16 and 12/05/16 I left message for her to contact me because I needed her drug card info to submit for approval and drug.  Patient has never returned any calls so I will assume she is not interested in [facility] therapy at this point.

## 2016-12-12 ENCOUNTER — Ambulatory Visit (INDEPENDENT_AMBULATORY_CARE_PROVIDER_SITE_OTHER): Payer: Managed Care, Other (non HMO) | Admitting: Internal Medicine

## 2016-12-12 ENCOUNTER — Encounter: Payer: Self-pay | Admitting: Internal Medicine

## 2016-12-12 VITALS — BP 108/54 | HR 66 | Ht 66.5 in | Wt 218.0 lb

## 2016-12-12 DIAGNOSIS — R0602 Shortness of breath: Secondary | ICD-10-CM | POA: Diagnosis not present

## 2016-12-12 DIAGNOSIS — R5383 Other fatigue: Secondary | ICD-10-CM | POA: Diagnosis not present

## 2016-12-12 DIAGNOSIS — G4719 Other hypersomnia: Secondary | ICD-10-CM | POA: Diagnosis not present

## 2016-12-12 DIAGNOSIS — E669 Obesity, unspecified: Secondary | ICD-10-CM

## 2016-12-12 DIAGNOSIS — I10 Essential (primary) hypertension: Secondary | ICD-10-CM | POA: Diagnosis not present

## 2016-12-12 MED ORDER — HYDRALAZINE HCL 50 MG PO TABS: 50.0000 mg | ORAL_TABLET | Freq: Three times a day (TID) | ORAL | 1 refills | Status: DC

## 2016-12-12 NOTE — Progress Notes (Signed)
OFFICE NOTE  Chief Complaint:  Follow-up fatigue  Primary Care Physician: Horald Pollen, MD  HPI:  Ariana Cohen is a 56 y.o. female with a history of hypertension, leg edema, hypokalemia (possibly related to Lasix) and excessive daytime sleepiness and fatigue. She's had a number of years it difficult to control hypertension. She's been followed by several primary care providers and recently saw Marijo File, who referred her to [place] for further evaluation. Over the years she's required a number of medications for blood pressure control. Recently she was on Norvasc which she said was helpful although had significant lower extremity swelling. Subsequently she had been put on clonidine which she said made her very sleepy in addition to her normal level of sleepiness. She is also on metoprolol and was placed on Diovan. She takes hydralazine as well. She was prescribed 50 mg 3 times a day however she only takes 100 mg daily. She works third shift and typically when she gets home sleeps for about 4 hours. She says she takes Lasix when she gets home and that often interrupts her sleep to get up to urinate. Recently she's required potassium based on laboratory work which showed some degree of hypokalemia. She generally takes the metoprolol also in the morning when she gets home from work before going to sleep as well as Diovan. She says that she does have difficulty sleeping when she tries to go to sleep. She is waking up several times either to urinate or for other reasons. Then she feels sleepy throughout the day. She oftentimes dozes off. She's noted to snore and denies any gasping or witnessed sleep apnea. Her sleepiness score today was quite high at 18. There is a strong family history of hypertension and her family both her mother and father and both grandparents. She also has a 92 year old sister with hypertension. She denies any chest pain but does get short of breath with exertion. Her EKG  today shows PVCs for which she says she is asymptomatic. There is also evidence of LVH and nonspecific T-wave changes. She last had an echocardiogram in [email or web address] which showed normal LVEF of 55-60% and mild mitral regurgitation with no evidence of mitral valve prolapse. She does have a mitral murmur.  09/12/2015  Ariana Cohen returns today for follow-up. She reports continued hypertension despite changes in her medications. She is currently on hydralazine 50 mg twice a day, Toprol-XL 50 mg daily and valsartan 320 mg daily. Blood pressure today was 180/100. She's complaining of persistent swelling of her legs. She gets short of breath with activity. Her nuclear stress test was negative for ischemia however demonstrated a low EF of 45%. Echo however demonstrated normal systolic function therefore I suspect a gating abnormality. There was however stage II diastolic dysfunction and mild mitral regurgitation which is evidenced by her murmur. She does have a history of hypokalemia which could be related to Lasix or one must consider a more rare causes of hypertension such as hyperaldosteronism. There is however strong family history of hypertension. I also think she has significant fatigue and would benefit from a sleep study. This is scheduled on 10/09/2015.  11/06/2015  I saw Ariana Cohen back today in follow-up. Her blood pressure remains uncontrolled. She reports no improvement in her swelling with Lasix and to stop taking Lasix and potassium. Recently she's our pharmacist and hydralazine was increased to 100 mg twice a day. She reports strict compliance with medications although there is been questions  of this in the past. We talked about the possible dangers of me increasing her medicine if she was noncompliant and then all of a sudden started taking the medicine. That being said she insisted she was taking the medicine but blood pressure is not well controlled. Recently I did renin  and aldosterone testing which was negative.  03/26/2016  Ariana Cohen was seen back today in follow-up. Her blood pressure remains elevated. It's been very difficult to control. Despite increasing her hydralazine up to 100 mg 3 times a day, she is on max dose Bystolic and max dose Micardis HCT. She's been intolerant to amlodipine due to venous insufficiency and swelling. Previously she been on clonidine short acting pills but had significant fatigue. She likely has sleep apnea but has not yet had her sleep study. That is pending.  06/11/2016  Ariana Cohen returns today for follow-up. Her blood pressure continues to be difficult to manage. She is currently on Catapres TTS 0.2 mg patch with hydralazine 100 mg 3 times a day and Bystolic 10 mg daily. She also takes eye cart his HCTZ 80/25 mg daily. She did see [place] of pharmacist and her hypertension clinic who recommended substituting low-dose minoxidil to decrease her clonidine however this was significantly intolerant. She developed significant swelling and side effects related to the minoxidil discontinued it. Surprisingly, blood pressure is better controlled today in the office at 138/76, however this morning she noted it was 180/111. Not clear why there are some significant differences in her pressures. She has a very new blood pressure cuff with features to export it to her cell phone and I reviewed a number of blood pressure readings with her in the office. There does seem to be a trend toward morning blood pressure elevations suggesting that she may not be getting full coverage throughout the night.  08/12/2016  Ariana Cohen was seen today for follow-up. She recently had a sleep study which did not clearly show obstructive sleep apnea however Dr. Tresa Endo wants her to undergo an MSLT test. He added Aldactone for additional blood pressure control which is possibly improved to 158/88 today. She says her numbers are lower at home but occasionally she forgets to  use her clonidine patch. That being said she says she is very compliant with her medicines. She continues to have some lower extremity edema which I think is partially related to venous insufficiency. She has improvement with compression stockings. She was concerned about discoloration in her toenails is a problem possibly related to poor circulation, however on exam it's more consistent with onychomycosis.  11/13/2016  Ariana Cohen returns today for follow-up. I rechecked her blood pressure is low today at 104 was 62. I rechecked it again manually at 100/50. She says she just recently took her hydralazine. She says that she discontinued her Catapres patch recently due to dry mouth and has considered decreasing some of her blood pressure medicines because of low blood pressure. I wonder if this is due to some labile hypertension. She was scheduled and completed a multiple sleep latency test. Those results are still pending. She is being treated for allergies and is on high-dose antihistamines.  12/12/2016  Ariana Cohen was seen today for follow-up. She continues to have low blood pressure which it is unclear if this is contributing to her fatigue. She does today she now gets some shortness of breath although it does not happen routinely and is not significantly worse than it was last year. I indicated that she had  had a stress test and an echo last year however she does not recall it. That test did show a fixed inferior defect with an EF of 45% on the nuclear stress test, thought to be scar or possibly artifact. Her echo however showed normal wall motion and no evidence for scar however she did have stage II diastolic dysfunction. It's possible that her degree of diastolic dysfunction could be associated with shortness of breath on exertion. She recently had a multiple sleep latency study which was abnormal suggesting hypersomnolence. She has a follow-up next week with Dr. Tresa Endo who will hopefully discuss starting  stimulants with her.  PMHx:  Past Medical History:  Diagnosis Date  . Anxiety   . Fibroids   . Hypertension   . Urticaria     Past Surgical History:  Procedure Laterality Date  . CESAREAN SECTION  1998; 2000  . MYOMECTOMY  1992    FAMHx:  Family History  Problem Relation Age of Onset  . Hypertension Mother   . Renal Disease Mother   . Hypertension Father   . Heart disease Father        also OSA  . Hypertension Maternal Grandmother   . Heart attack Maternal Grandmother   . Thyroid disease Maternal Grandmother        thyroidectomy  . Hypertension Maternal Grandfather   . Multiple myeloma Maternal Grandfather   . Sleep apnea Maternal Grandfather   . Hypertension Sister     SOCHx:   reports that she has never smoked. She has never used smokeless tobacco. She reports that she does not drink alcohol or use drugs.  ALLERGIES:  Allergies  Allergen Reactions  . Norvasc [Amlodipine Besylate] Swelling  . Aspirin Hives  . Minoxidil Other (See Comments)    Weight gain, extreme edema  Legs, feet, hands, lethargic      ROS: Pertinent items noted in HPI and remainder of comprehensive ROS otherwise negative.  HOME MEDS: Current Outpatient Prescriptions  Medication Sig Dispense Refill  . buPROPion (WELLBUTRIN XL) 150 MG 24 hr tablet Take 1 tablet (150 mg total) by mouth every morning. 30 tablet 1  . BYSTOLIC 10 MG tablet TAKE 1 TABLET (10 MG TOTAL) BY MOUTH DAILY. 90 tablet 3  . cetirizine (ZYRTEC) 10 MG tablet Take 10 mg by mouth daily.    . montelukast (SINGULAIR) 10 MG tablet Take 1 tablet (10 mg total) by mouth at bedtime. 30 tablet 3  . ranitidine (ZANTAC 150 MAXIMUM STRENGTH) 150 MG tablet Take 1 tablet (150 mg total) by mouth 2 (two) times daily. 60 tablet 3  . spironolactone (ALDACTONE) 25 MG tablet Take 1 tablet (25 mg total) by mouth daily. 90 tablet 2  . telmisartan-hydrochlorothiazide (MICARDIS HCT) 80-25 MG tablet Take 1 tablet by mouth daily. 90 tablet 1  .  hydrALAZINE (APRESOLINE) 50 MG tablet Take 1 tablet (50 mg total) by mouth 3 (three) times daily. 270 tablet 1   No current facility-administered medications for this visit.     LABS/IMAGING: No results found for this or any previous visit (from the past 48 hour(s)). No results found.  WEIGHTS: Wt Readings from Last 3 Encounters:  12/12/16 218 lb (98.9 kg)  11/13/16 218 lb (98.9 kg)  10/28/16 215 lb (97.5 kg)    VITALS: BP (!) 108/54   Pulse 66   Ht 5' 6.5" (1.689 m)   Wt 218 lb (98.9 kg)   LMP 06/20/2013   BMI 34.66 kg/m   EXAM: Deferred  EKG:  Deferred  ASSESSMENT: 1. Labile hypertension 2. Onchomycosis 3. DOE - normal LV systolic function with grade 2 diastolic dysfunction and mild LVH 4. Murmur 5. PVCs 6. Significant daytime sleepiness with concern for sleep apnea 7. Leg edema  PLAN: 1.   Ariana Cohen continues to have significant fatigue but did not have sleep apnea. Rather there is hypersomnolence and she may be candidate for stimulants. She has follow-up with Dr. Claiborne Billings next week who may prescribe these at his discretion. With regards to her blood pressure I would advise decreasing her hydralazine to 50 mg 3 times a day to see if this helps with some of her fatigue. She does not appear to volume overloaded although she has grade 2 diastolic dysfunction. Further workup may be necessary if she continues to be symptomatic although this does seem to be unchanged from her symptoms last year.  Pixie Casino, MD, [facility] Attending Cardiologist [facility] 12/12/2016, 4:56 PM

## 2016-12-12 NOTE — Patient Instructions (Signed)
Medication Instructions: Your physician recommends that you continue on your current medications as directed. Please refer to the Current Medication list given to you today.  Decrease Hydralazine to 50 mg three times daily.  Follow-Up: Your physician wants you to follow-up in: 6 months with Dr. Debara Pickett. You will receive a reminder letter in the mail two months in advance. If you don't receive a letter, please call our office to schedule the follow-up appointment.  If you need a refill on your cardiac medications before your next appointment, please call your pharmacy.

## 2016-12-13 ENCOUNTER — Other Ambulatory Visit: Payer: Self-pay | Admitting: Internal Medicine

## 2016-12-16 ENCOUNTER — Ambulatory Visit (INDEPENDENT_AMBULATORY_CARE_PROVIDER_SITE_OTHER): Payer: Managed Care, Other (non HMO) | Admitting: Cardiovascular Disease

## 2016-12-16 ENCOUNTER — Encounter: Payer: Self-pay | Admitting: Cardiovascular Disease

## 2016-12-16 VITALS — BP 123/80 | HR 64 | Ht 66.0 in | Wt 217.2 lb

## 2016-12-16 DIAGNOSIS — G4711 Idiopathic hypersomnia with long sleep time: Secondary | ICD-10-CM

## 2016-12-16 DIAGNOSIS — G4719 Other hypersomnia: Secondary | ICD-10-CM | POA: Diagnosis not present

## 2016-12-16 DIAGNOSIS — R0683 Snoring: Secondary | ICD-10-CM

## 2016-12-16 DIAGNOSIS — F329 Major depressive disorder, single episode, unspecified: Secondary | ICD-10-CM

## 2016-12-16 DIAGNOSIS — F419 Anxiety disorder, unspecified: Secondary | ICD-10-CM

## 2016-12-16 DIAGNOSIS — G4730 Sleep apnea, unspecified: Secondary | ICD-10-CM

## 2016-12-16 DIAGNOSIS — I10 Essential (primary) hypertension: Secondary | ICD-10-CM | POA: Diagnosis not present

## 2016-12-16 DIAGNOSIS — E669 Obesity, unspecified: Secondary | ICD-10-CM

## 2016-12-16 DIAGNOSIS — F32A Depression, unspecified: Secondary | ICD-10-CM

## 2016-12-16 MED ORDER — ARMODAFINIL 150 MG PO TABS: 150.0000 mg | ORAL_TABLET | Freq: Every day | ORAL | 5 refills | Status: DC

## 2016-12-16 NOTE — Patient Instructions (Signed)
Your physician has recommended you make the following change in your medication:   1.) start new prescription given today for Armodafinil 150 mg as directed.  Your physician recommends that you schedule a follow-up appointment in: 4 months.

## 2016-12-16 NOTE — Progress Notes (Signed)
Cardiology Office Note    Date:  12/17/2016   ID:  ELSEY Cohen, DOB 03-31-1961, MRN [id number]  PCP:  Horald Pollen, MD  Cardiologist:  Shelva Majestic, MD (sleep); Dr. Debara Pickett  F/U sleep evaluation  History of Present Illness:  Ariana Cohen is a 56 y.o. female  African-American female who was recently referred for a sleep evaluation due to excessive daytime sleepiness, fatigue, and hypertension.      Ms. Coultas had previously worked night shift for 4  years, but stopped working this shift in September 2017.  She has a history of very difficult to control hypertension, obesity, and has had issues with significant excessive daytime sleepiness.  She was referred for a diagnostic polysomnogram which was done on 05/24/2016.  This was abnormal in the sense that she had borderline sleep apnea with an AHI of 4.8 and RDI of 6.6 per hour.  AHI during supine sleep was 10.1 and consistent with mild sleep apnea.  At the time of her sleep study, she had significant excessive daytime sleepiness and her Epworth Sleepiness Scale score was 23.  There was mild oxygen desaturation to a nadir of 86% and she had loud snoring.  She had reduced REM sleep and only 2.95% with prolonged latency to REM sleep.  There were occasional PVCs noted.  She has had difficulty controlling her blood pressure and most had been maintained on Micardis HCT 39/03.0, Bystolic 10 mg, hydralazine 100 mg every 8 hours, and clonidine 0.2 mg 24-hour patch.  She presents for evaluation.  Since February 2018.  She has not been working due to significant anxiety and stress related to her change of jobs.  Now that she has not been working she feels she is sleeping a little bit better.  When I saw her for initial evaluation An Epworth Sleepiness Scale score was recalculated in the office today and this endorsed at 18 and is still consistent with significant excessive daytime sleepiness.  Epworth Sleepiness Scale: Situation   Chance of  Dozing/Sleeping (0 = never , 1 = slight chance , 2 = moderate chance , 3 = high chance )   sitting and reading 2   watching TV 2   sitting inactive in a public place 2   being a passenger in a motor vehicle for an hour or more 3   lying down in the afternoon 3   sitting and talking to someone 2   sitting quietly after lunch (no alcohol) 2   while stopped for a few minutes in traffic as the driver 2   Total Score  18   Since I last saw her, she underwent a multiple sleep latency test to evaluate for hypersomnia.  This was abnormal and although there was no evidence for sleep onset gram sleep during her naps, she had pathological sleepiness evidenced by a very short mean sleep latency of 1:18 minutes.  She was felt to have idiopathic hypersomnia.  Of note, the Epworth Sleepiness Scale score which she endorsed at the time of this MLS T study was 23.  She continues to feel tired and winded.  She has been stressed by her father who is in hospice.  She denies palpitations.  She continues to take my Cardis HCT 80/25, spironolactone 25 mg, Bystolic 10 mg, and hydralazine 50 mg every 8 hours for hypertension.  She is on Wellbutrin XL.  Past Medical History:  Diagnosis Date  . Anxiety   . Fibroids   . Hypertension   .  Urticaria     Past Surgical History:  Procedure Laterality Date  . [place]; 2000  . MYOMECTOMY  1992    Current Medications: Outpatient Medications Prior to Visit  Medication Sig Dispense Refill  . buPROPion (WELLBUTRIN XL) 150 MG 24 hr tablet Take 1 tablet (150 mg total) by mouth every morning. 30 tablet 1  . BYSTOLIC 10 MG tablet TAKE 1 TABLET (10 MG TOTAL) BY MOUTH DAILY. 90 tablet 3  . cetirizine (ZYRTEC) 10 MG tablet Take 10 mg by mouth daily.    . hydrALAZINE (APRESOLINE) 50 MG tablet Take 1 tablet (50 mg total) by mouth 3 (three) times daily. 270 tablet 1  . montelukast (SINGULAIR) 10 MG tablet Take 1 tablet (10 mg total) by mouth at bedtime. 30 tablet 3    . ranitidine (ZANTAC 150 MAXIMUM STRENGTH) 150 MG tablet Take 1 tablet (150 mg total) by mouth 2 (two) times daily. 60 tablet 3  . telmisartan-hydrochlorothiazide (MICARDIS HCT) 80-25 MG tablet TAKE 1 TABLET BY MOUTH DAILY. 90 tablet 1  . spironolactone (ALDACTONE) 25 MG tablet Take 1 tablet (25 mg total) by mouth daily. 90 tablet 2   No facility-administered medications prior to visit.      Allergies:   Norvasc [amlodipine besylate]; Aspirin; and Minoxidil   Social History   Social History  . Marital status: Legally Separated    Spouse name: N/A  . Number of children: N/A  . Years of education: N/A   Occupational History  .      works Government social research officer   Social History Main Topics  . Smoking status: Never Smoker  . Smokeless tobacco: Never Used  . Alcohol use No  . Drug use: No  . Sexual activity: No   Other Topics Concern  . None   Social History Narrative   epworth sleepiness scale = 18 (08/11/15)    She has 2 children, a 48 year old daughter who is at [facility] in a 71 year old son.  Family History:  The patient's family history includes Heart attack in her maternal grandmother; Heart disease in her father; Hypertension in her father, maternal grandfather, maternal grandmother, mother, and sister; Multiple myeloma in her maternal grandfather; Renal Disease in her mother; Sleep apnea in her maternal grandfather; Thyroid disease in her maternal grandmother.   ROS General: Negative; No fevers, chills, or night sweats;  HEENT: Negative; No changes in vision or hearing, sinus congestion, difficulty swallowing Pulmonary: Negative; No cough, wheezing, shortness of breath, hemoptysis Cardiovascular: Negative; No chest pain, presyncope, syncope, palpitations GI: Negative; No nausea, vomiting, diarrhea, or abdominal pain GU: Negative; No dysuria, hematuria, or difficulty voiding Musculoskeletal: Negative; no myalgias, joint pain, or weakness Hematologic/Oncology: Negative; no easy  bruising, bleeding Endocrine: Negative; no heat/cold intolerance; no diabetes Neuro: Negative; no changes in balance, headaches Skin: Negative; No rashes or skin lesions Psychiatric: Negative; No behavioral problems, depression Sleep: positive for snoring; excessive daytime sleepiness, hypersomnolence;  no bruxism, restless legs, hypnogognic hallucinations, no cataplexy Other comprehensive 14 point system review is negative.   PHYSICAL EXAM:   VS:  BP 123/80   Pulse 64   Ht '5\' 6"'$  (1.676 m)   Wt 217 lb 3.2 oz (98.5 kg)   LMP 06/20/2013   SpO2 95%   BMI 35.06 kg/m     Repeat blood pressure 124/76  Wt Readings from Last 3 Encounters:  12/16/16 217 lb 3.2 oz (98.5 kg)  12/12/16 218 lb (98.9 kg)  11/13/16 218 lb (98.9 kg)  General: Alert, oriented, no distress.  Skin: normal turgor, no rashes, warm and dry HEENT: Normocephalic, atraumatic. Pupils equal round and reactive to light; sclera anicteric; extraocular muscles intact; Fundi Arteriolar narrowing, without hemorrhages or exudates noted on prior evaluation Nose without nasal septal hypertrophy Mouth/Parynx benign; Mallinpatti scale 2 Neck: No JVD, no carotid bruits; normal carotid upstroke Lungs: clear to ausculatation and percussion; no wheezing or rales Chest wall: without tenderness to palpitation Heart: PMI not displaced, RRR, s1 s2 normal, 1/6 systolic murmur, no diastolic murmur, no rubs, gallops, thrills, or heaves Abdomen: soft, nontender; no hepatosplenomehaly, BS+; abdominal aorta nontender and not dilated by palpation. Back: no CVA tenderness Pulses 2+ Musculoskeletal: full range of motion, normal strength, no joint deformities Extremities: no clubbing cyanosis or edema, Homan's sign negative  Neurologic: grossly nonfocal; Cranial nerves grossly wnl Psychologic: Normal mood and affect   Studies/Labs Reviewed:   EKG:  EKG is not  ordered today.   I have personally reviewed the ECG from 06/12/2016, which shows  normal sinus rhythm at 67 bpm.  There are T-wave changes inferolaterally.  Recent Labs: BMP Latest Ref Rng & Units 10/31/2016 08/08/2016 12/14/2015  Glucose 65 - 99 mg/dL 102(H) 92 86  BUN 7 - 25 mg/dL '15 11 11  '$ Creatinine 0.50 - 1.05 mg/dL 0.79 0.86 0.82  Sodium 135 - 146 mmol/L 140 142 141  Potassium 3.5 - 5.3 mmol/L 3.8 3.6 3.4(L)  Chloride 98 - 110 mmol/L 102 105 103  CO2 20 - 31 mmol/L '29 28 30  '$ Calcium 8.6 - 10.4 mg/dL 9.4 9.3 9.2     Hepatic Function Latest Ref Rng & Units 10/31/2016  Total Protein 6.1 - 8.1 g/dL 7.3  Albumin 3.6 - 5.1 g/dL 4.0  AST 10 - 35 U/L 24  ALT 6 - 29 U/L 25  Alk Phosphatase 33 - 130 U/L 112  Total Bilirubin 0.2 - 1.2 mg/dL 0.7    CBC Latest Ref Rng & Units 10/31/2016  WBC 3.8 - 10.8 K/uL 9.9  Hemoglobin 11.7 - 15.5 g/dL 14.3  Hematocrit 35.0 - 45.0 % 44.0  Platelets 140 - 400 K/uL 332   Lab Results  Component Value Date   MCV 83.7 10/31/2016   Lab Results  Component Value Date   TSH 0.64 10/31/2016   No results found for: HGBA1C   BNP    Component Value Date/Time   BNP 195.0 (H) 10/18/2015 1544    ProBNP No results found for: PROBNP   Lipid Panel  No results found for: CHOL, TRIG, HDL, CHOLHDL, VLDL, LDLCALC, LDLDIRECT   RADIOLOGY: No results found.   Additional studies/ records that were reviewed today include:  I reviewed the patient's records with Dr. Debara Pickett, ECG, sleep study.    ASSESSMENT:    1. Excessive daytime sleepiness   2. Idiopathic hypersomnia   3. Sleep apnea, unspecified type   4. Obesity (BMI 30.0-34.9)   5. Snoring   6. Essential hypertension   7. Anxiety and depression      PLAN:  Ms. Florentina Marquart is a 56 year old African American female who has a history of obesity and has had resistant hypertension to her current medical regimen.  Her sleep study suggests increased upper airway resistance with mild obstructive sleep apnea particularly with supine sleep.  She had loud snoring and mild oxygen  desaturation.  She's had significant excessive daytime sleepiness, which initially may have been contributed by her 4 years of working third shift. When I initially saw her, she had been  working the first shift, but was having significant frustration and anxiety due to having to meet many work-related deadlines.  As result, she has not been working since February 2018.  On her initial sleep study.  She had abnormal sleep architecture with reduction in rem sleep, which may have been contributed by her antidepressant medication.  However, she has had significant daytime sleepiness.  For this reason, an mL ST study was done.  This was abnormal in that she developed almost immediate sleep at 1 minute and 18 seconds into each nap.  She did not have any sleep onset gram episodes and did not meet criteria for narcolepsy.  She denies any symptoms suggestive of cataplexy.  Her sleep.  Findings were suggestive of idiopathic hypersomnia.  I have suggested a trial of Nuvigil 150 mg in the morning to see if this can improve her significant excessive daytime sleepiness.  We again discussed proper sleep hygiene.  I also recommended that she avoid sleeping on her back since she did have mild sleep apnea with supine sleep.  She is moderately obese and I recommended weight loss and increased exercise.  Her blood pressure is improved with her medication adjustment that was made at her last office visit and she is tolerating my Cardis HCT, spironolactone, hydralazine and Bystolic.  I will see her in 4 months for reevaluation.    Medication Adjustments/Labs and Tests Ordered: Current medicines are reviewed at length with the patient today.  Concerns regarding medicines are outlined above.  Medication changes, Labs and Tests ordered today are listed in the Patient Instructions below. Patient Instructions  Your physician has recommended you make the following change in your medication:   1.) start new prescription given today for  Armodafinil 150 mg as directed.  Your physician recommends that you schedule a follow-up appointment in: 4 months.    Time spent 40 minutes  Signed, Shelva Majestic, MD , [facility] 12/17/2016 [place] [address] [place], [place], [place]  [address] Phone: [phone]

## 2016-12-18 ENCOUNTER — Ambulatory Visit: Payer: Managed Care, Other (non HMO) | Admitting: Emergency Medicine

## 2016-12-19 ENCOUNTER — Telehealth: Payer: Self-pay | Admitting: *Deleted

## 2016-12-19 NOTE — Telephone Encounter (Signed)
PA completed and approved for Armodafinil via Covermymeds.    Key: [id number]

## 2016-12-23 ENCOUNTER — Ambulatory Visit (INDEPENDENT_AMBULATORY_CARE_PROVIDER_SITE_OTHER): Payer: 59 | Admitting: Psychiatry

## 2016-12-23 DIAGNOSIS — F410 Panic disorder [episodic paroxysmal anxiety] without agoraphobia: Secondary | ICD-10-CM | POA: Diagnosis not present

## 2016-12-25 ENCOUNTER — Encounter (HOSPITAL_COMMUNITY): Payer: Self-pay | Admitting: Psychiatry

## 2016-12-25 ENCOUNTER — Other Ambulatory Visit (HOSPITAL_COMMUNITY): Payer: 59 | Attending: Psychiatry | Admitting: Psychiatry

## 2016-12-25 DIAGNOSIS — I10 Essential (primary) hypertension: Secondary | ICD-10-CM | POA: Insufficient documentation

## 2016-12-25 DIAGNOSIS — F418 Other specified anxiety disorders: Secondary | ICD-10-CM | POA: Insufficient documentation

## 2016-12-25 DIAGNOSIS — Z79899 Other long term (current) drug therapy: Secondary | ICD-10-CM | POA: Diagnosis not present

## 2016-12-25 DIAGNOSIS — F331 Major depressive disorder, recurrent, moderate: Secondary | ICD-10-CM | POA: Diagnosis present

## 2016-12-25 DIAGNOSIS — F410 Panic disorder [episodic paroxysmal anxiety] without agoraphobia: Secondary | ICD-10-CM
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
## 2016-12-25 NOTE — Progress Notes (Signed)
Psychiatric Initial Adult Assessment   Patient Identification: Ariana Cohen MRN:  [id number] Date of Evaluation:  12/25/2016 Referral Source: her therapist Chief Complaint:   Chief Complaint    Depression; Anxiety; Stress     Visit Diagnosis: major depression recurrent moderate  History of Present Illness:  Ariana Cohen has been depressed in the past but the current episode started about Feb 2017 when she had simultaneous stressors with taking care of her mother with dementia, problems with her son and mainly problems with her job.  She had changed to a new job with the same company and was overwhelmed, making uncharacteristic errors and feeling very unsupported.  She had lost her house some years ago after her husband at the time did not help with finances and that has continued bugging her in the back of her mind.  The current main stress is the stress of having to return to her job which she has decided not to do.  That creates financial stress.  Her father died 2 weeks ago and that was the other thing that made the depression worse.   She is tired, not motivated, losing interest in activities, sleeping excessively, thinking life is not worth living at times, wanting to avoid and isolate.  Medication is not helping much.  She does not like to have to get help but admits she needs some help now.  Her father died of congestive heart failure and heart/blood pressure problems have killed other members of her family.  She has hypertension that has been hard to control even with 4 medications and worries about her own health and future.  Associated Signs/Symptoms: Depression Symptoms:  depressed mood, anhedonia, hypersomnia, fatigue, difficulty concentrating, impaired memory, anxiety, (Hypo) Manic Symptoms:  Irritable Mood, Anxiety Symptoms:  Excessive Worry, Psychotic Symptoms:  none PTSD Symptoms: Negative  Past Psychiatric History: outpatient treatment only and only recently  Previous  Psychotropic Medications: Yes   Substance Abuse History in the last 12 months:  No.  Consequences of Substance Abuse: Negative  Past Medical History:  Past Medical History:  Diagnosis Date  . Anxiety   . Depression   . Fibroids   . Hypertension   . Urticaria     Past Surgical History:  Procedure Laterality Date  . [place]; 2000  . MYOMECTOMY  1992    Family Psychiatric History: mother schizophrenic and brother alcoholic  Family History:  Family History  Problem Relation Age of Onset  . Hypertension Mother   . Renal Disease Mother   . Schizophrenia Mother   . Hypertension Father   . Heart disease Father        also OSA  . Hypertension Maternal Grandmother   . Heart attack Maternal Grandmother   . Thyroid disease Maternal Grandmother        thyroidectomy  . Hypertension Maternal Grandfather   . Multiple myeloma Maternal Grandfather   . Sleep apnea Maternal Grandfather   . Hypertension Sister   . Drug abuse Maternal Aunt     Social History:   Social History   Social History  . Marital status: Legally Separated    Spouse name: N/A  . Number of children: N/A  . Years of education: N/A   Occupational History  .      works Government social research officer   Social History Main Topics  . Smoking status: Never Smoker  . Smokeless tobacco: Never Used  . Alcohol use No  . Drug use: No  . Sexual activity:  No   Other Topics Concern  . None   Social History Narrative   epworth sleepiness scale = 18 (08/11/15)    Additional Social History: works as English as a second language teacher, daughter in college and son in high school  Allergies:   Allergies  Allergen Reactions  . Norvasc [Amlodipine Besylate] Swelling  . Aspirin Hives  . Minoxidil Other (See Comments)    Weight gain, extreme edema  Legs, feet, hands, lethargic      Metabolic Disorder Labs: No results found for: HGBA1C, MPG No results found for: PROLACTIN No results found for: CHOL, TRIG, HDL, CHOLHDL, VLDL,  LDLCALC   Current Medications: Current Outpatient Prescriptions  Medication Sig Dispense Refill  . Armodafinil 150 MG tablet Take 1 tablet (150 mg total) by mouth daily after breakfast. 30 tablet 5  . buPROPion (WELLBUTRIN XL) 150 MG 24 hr tablet Take 1 tablet (150 mg total) by mouth every morning. 30 tablet 1  . BYSTOLIC 10 MG tablet TAKE 1 TABLET (10 MG TOTAL) BY MOUTH DAILY. 90 tablet 3  . cetirizine (ZYRTEC) 10 MG tablet Take 10 mg by mouth daily.    . hydrALAZINE (APRESOLINE) 50 MG tablet Take 1 tablet (50 mg total) by mouth 3 (three) times daily. 270 tablet 1  . montelukast (SINGULAIR) 10 MG tablet Take 1 tablet (10 mg total) by mouth at bedtime. 30 tablet 3  . ranitidine (ZANTAC 150 MAXIMUM STRENGTH) 150 MG tablet Take 1 tablet (150 mg total) by mouth 2 (two) times daily. 60 tablet 3  . spironolactone (ALDACTONE) 25 MG tablet Take 25 mg by mouth daily.    Marland Kitchen telmisartan-hydrochlorothiazide (MICARDIS HCT) 80-25 MG tablet TAKE 1 TABLET BY MOUTH DAILY. 90 tablet 1   No current facility-administered medications for this visit.     Neurologic: Headache: Negative Seizure: Negative Paresthesias:Negative  Musculoskeletal: Strength & Muscle Tone: within normal limits Gait & Station: normal Patient leans: N/A  Psychiatric Specialty Exam: ROS  Last menstrual period 06/20/2013.There is no height or weight on file to calculate BMI.  General Appearance: Well Groomed  Eye Contact:  Good  Speech:  Clear and Coherent  Volume:  Normal  Mood:  Depressed  Affect:  Congruent  Thought Process:  Coherent and Goal Directed  Orientation:  Full (Time, Place, and Person)  Thought Content:  Logical  Suicidal Thoughts:  No  Homicidal Thoughts:  No  Memory:  Immediate;   Good Recent;   Good Remote;   Good  Judgement:  Intact  Insight:  Good  Psychomotor Activity:  Normal  Concentration:  Concentration: Good and Attention Span: Good  Recall:  Good  Fund of Knowledge:Good  Language: Good   Akathisia:  Negative  Handed:  Right  AIMS (if indicated):  0  Assets:  Communication Skills Desire for Improvement Financial Resources/Insurance Housing Leisure Time Resilience Social Support Talents/Skills Transportation Vocational/Educational  ADL's:  Intact  Cognition: WNL  Sleep:  Too much    Treatment Plan Summary: Admit to IOP with daily group therapy   Donnelly Angelica, MD 8/22/201812:40 PM
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
## 2016-12-26 ENCOUNTER — Other Ambulatory Visit (HOSPITAL_COMMUNITY): Payer: 59 | Admitting: Psychiatry

## 2016-12-26 DIAGNOSIS — F331 Major depressive disorder, recurrent, moderate: Secondary | ICD-10-CM | POA: Diagnosis not present

## 2016-12-26 DIAGNOSIS — F410 Panic disorder [episodic paroxysmal anxiety] without agoraphobia: Secondary | ICD-10-CM

## 2016-12-26 NOTE — Progress Notes (Signed)
    Daily Group Progress Note  Program: IOP   Group Time: 9:00-12:00   Participation Level: Active   Behavioral Response: Appropriate   Type of Therapy:  Group Therapy   Summary of Progress: Pt presented as hesitant but engaged.  Pt joined group just before psychoeducation portion.  Counselors introduced themselves and group members to pt and explained format of group.  Pt expressed some hesitation about sharing in group, wanting to make sure everything would be confidential.  Counselors explained confidentiality policy.  Pt listened to and engaged in presentation about nutrition, sleep and exercise provided by representative from [facility].  Nancie Neas, LPC

## 2016-12-26 NOTE — Progress Notes (Signed)
    Daily Group Progress Note  Program: IOP  Group Time: 9:00-12:00  Participation Level: Active  Behavioral Response: Appropriate  Type of Therapy:  Group Therapy  Summary of Progress: Pt. Presented as talkative, engaged in group process. Pt. Discussed the death of her father and conflict with her stepmother related to his burial. Pt. Discussed her anger towards her stepmother and history of painful relationship with her father prior to his death. Pt. Discussed her history of being known by others as an "angry person". Pt. Received feedback from the counselor about depression and how she might have been using anger as a coping mechanism and that anger and irritability are symptoms of depression. Pt. Participated in a guided meditation, breathing exercise to assist with stress management and acceptance of thoughts and emotions.     Nancie Neas, LPC

## 2016-12-27 ENCOUNTER — Other Ambulatory Visit (HOSPITAL_COMMUNITY): Payer: 59 | Admitting: Psychiatry

## 2016-12-27 DIAGNOSIS — F410 Panic disorder [episodic paroxysmal anxiety] without agoraphobia: Secondary | ICD-10-CM

## 2016-12-27 DIAGNOSIS — F331 Major depressive disorder, recurrent, moderate: Secondary | ICD-10-CM | POA: Diagnosis not present

## 2016-12-30 ENCOUNTER — Encounter: Payer: Self-pay | Admitting: Emergency Medicine

## 2016-12-30 ENCOUNTER — Other Ambulatory Visit (HOSPITAL_COMMUNITY): Payer: 59 | Admitting: Psychiatry

## 2016-12-30 ENCOUNTER — Telehealth (HOSPITAL_COMMUNITY): Payer: Self-pay | Admitting: Psychiatry

## 2016-12-30 ENCOUNTER — Ambulatory Visit (INDEPENDENT_AMBULATORY_CARE_PROVIDER_SITE_OTHER): Payer: Managed Care, Other (non HMO) | Admitting: Emergency Medicine

## 2016-12-30 VITALS — BP 127/79 | HR 64 | Temp 98.1°F | Resp 16 | Ht 66.25 in | Wt 217.4 lb

## 2016-12-30 DIAGNOSIS — I10 Essential (primary) hypertension: Secondary | ICD-10-CM

## 2016-12-30 DIAGNOSIS — Z23 Encounter for immunization: Secondary | ICD-10-CM

## 2016-12-30 DIAGNOSIS — G4719 Other hypersomnia: Secondary | ICD-10-CM

## 2016-12-30 DIAGNOSIS — G4711 Idiopathic hypersomnia with long sleep time: Secondary | ICD-10-CM

## 2016-12-30 DIAGNOSIS — E669 Obesity, unspecified: Secondary | ICD-10-CM

## 2016-12-30 DIAGNOSIS — E66811 Obesity, class 1: Secondary | ICD-10-CM

## 2016-12-30 DIAGNOSIS — F32A Depression, unspecified: Secondary | ICD-10-CM

## 2016-12-30 DIAGNOSIS — G4730 Sleep apnea, unspecified: Secondary | ICD-10-CM | POA: Diagnosis not present

## 2016-12-30 DIAGNOSIS — F329 Major depressive disorder, single episode, unspecified: Secondary | ICD-10-CM | POA: Diagnosis not present

## 2016-12-30 DIAGNOSIS — F411 Generalized anxiety disorder: Secondary | ICD-10-CM | POA: Diagnosis not present

## 2016-12-30 NOTE — Progress Notes (Signed)
    Daily Group Progress Note  Program: IOP   Group Time: 9:00-12:00   Participation Level: Active   Behavioral Response: Appropriate, Sharing   Type of Therapy:  Group Therapy   Summary of Progress: Pt presented as engaged.  Pt expressed desire to learn more about self-care strategies, particularly yoga.  Counselor told pt about free yoga and mindfulness opportunity offered on Sundays in [place].  Pt said she has mainly prioritized taking care of others over herself in the past.  Pt asked how to get her son to open up if she knows he is struggling-counselor offered that vulnerability often leads to reciprocal vulnerability but emphasized that we cannot make anyone open up to [place].  Pt watched Erasmo Downer Neff's TED Talk on Self-Compassion and engaged in following group discussion.  Counselor gave pt list of 100 self-care activities.  Nancie Neas, LPC

## 2016-12-30 NOTE — Patient Instructions (Addendum)
IF you received an x-ray today, you will receive an invoice from [facility]. Please contact [facility] at [phone] with questions or concerns regarding your invoice.   IF you received labwork today, you will receive an invoice from [place]. Please contact [facility] at [phone] with questions or concerns regarding your invoice.   Our billing staff will not be able to assist you with questions regarding bills from these companies.  You will be contacted with the lab results as soon as they are available. The fastest way to get your results is to activate your My Chart account. Instructions are located on the last page of this paperwork. If you have not heard from [place] regarding the results in 2 weeks, please contact this office.    Hypersomnia Hypersomnia is when you feel extremely tired during the day even though you're getting plenty of sleep at night. You may need to take naps during the day, and you may also be extremely difficult to wake up when you are sleeping. What are the causes? The cause of your hypersomnia may not be known. Hypersomnia may be caused by:  Medicines.  Sleep disorders, such as narcolepsy.  Trauma or injury to your head or nervous system.  Using drugs or alcohol.  Tumors.  Medical conditions, such as depression or hypothyroidism.  Genetics.  What are the signs or symptoms? The main symptoms of hypersomnia include:  Feeling extremely tired throughout the day.  Being very difficult to wake up.  Sleeping for longer and longer periods.  Taking naps throughout the day.  Other symptoms may include:  Feeling: ? Restless. ? Annoyed. ? Anxious. ? Low energy.  Having difficulty: ? Remembering. ? Speaking. ? Thinking.  Losing your appetite.  Experiencing hallucinations.  How is this diagnosed? Hypersomnia may be diagnosed by:  Medical history and physical exam. This will include a sleep history.  Completing  sleep logs.  Tests may also be done, such as: ? Polysomnography. ? Multiple sleep latency test (MSLT).  How is this treated? There is no cure for hypersomnia, but treatment can be very effective in helping manage the condition. Treatment may include:  Lifestyle and sleeping strategies to help cope with the condition.  Stimulant medicines.  Treating any underlying causes of hypersomnia.  Follow these instructions at home:  Take medicines only as directed by your health care provider.  Schedule short naps for when you feel sleepiest during the day. Tell your employer or teachers that you have hypersomnia. You may be able to adjust your schedule to include time for naps.  Avoid drinking alcohol or caffeinated beverages.  Do not eat a heavy meal before bedtime. Eat at about the same times every day.  Do not drive or operate heavy machinery if you are sleepy.  Do not swim or go out on the water without a life jacket.  If possible, adjust your schedule so that you do not have to work or be active at night.  Keep all follow-up visits as directed by your health care provider. This is important. Contact a health care provider if:  You have new symptoms.  Your symptoms get worse. Get help right away if: You have serious thoughts of hurting yourself or someone else. This information is not intended to replace advice given to you by your health care provider. Make sure you discuss any questions you have with your health care provider. Document Released: 04/12/2002 Document Revised: 09/28/2015 Document Reviewed: 11/25/2013 Elsevier Interactive Patient Education  2018 Elsevier Inc.  

## 2016-12-30 NOTE — Progress Notes (Signed)
Ariana Cohen 56 y.o.   Chief Complaint  Patient presents with  . Follow-up    STRESS and to return to work    HISTORY OF PRESENT ILLNESS: This is a 56 y.o. female here requesting to have temporary disability papers filled out; last seen by me 08/26/16 for acute reaction to situational stress; under Psychiatrist's care; also cardiology and sleep specialist; recently started on Armodafinil for daytime excessive sleepiness.   HPI   Prior to Admission medications   Medication Sig Start Date End Date Taking? Authorizing Provider  Armodafinil 150 MG tablet Take 1 tablet (150 mg total) by mouth daily after breakfast. 12/16/16  Yes Troy Sine, MD  buPROPion (WELLBUTRIN XL) 150 MG 24 hr tablet Take 1 tablet (150 mg total) by mouth every morning. 10/22/16 10/22/17 Yes Arfeen, Arlyce Harman, MD  BYSTOLIC 10 MG tablet TAKE 1 TABLET (10 MG TOTAL) BY MOUTH DAILY. 10/14/16  Yes Troy Sine, MD  cetirizine (ZYRTEC) 10 MG tablet Take 10 mg by mouth daily.   Yes [provider]  hydrALAZINE (APRESOLINE) 50 MG tablet Take 1 tablet (50 mg total) by mouth 3 (three) times daily. 12/12/16 06/10/17 Yes Hilty, Nadean Corwin, MD  montelukast (SINGULAIR) 10 MG tablet Take 1 tablet (10 mg total) by mouth at bedtime. 10/24/16  Yes Padgett, Rae Halsted, MD  ranitidine (ZANTAC 150 MAXIMUM STRENGTH) 150 MG tablet Take 1 tablet (150 mg total) by mouth 2 (two) times daily. 10/24/16  Yes Padgett, Rae Halsted, MD  spironolactone (ALDACTONE) 25 MG tablet Take 25 mg by mouth daily.   Yes [provider]  telmisartan-hydrochlorothiazide (MICARDIS HCT) 80-25 MG tablet TAKE 1 TABLET BY MOUTH DAILY. 12/13/16  Yes Hilty, Nadean Corwin, MD    Allergies  Allergen Reactions  . Norvasc [Amlodipine Besylate] Swelling  . Aspirin Hives  . Minoxidil Other (See Comments)    Weight gain, extreme edema  Legs, feet, hands, lethargic      Patient Active Problem List   Diagnosis Date Noted  . Transient alteration of  awareness 10/01/2016  . Memory loss 10/01/2016  . Obesity (BMI 30.0-34.9) 08/12/2016  . Onychomycosis 08/12/2016  . Acute reaction to situational stress 07/01/2016  . GAD (generalized anxiety disorder) 06/26/2016  . OSA (obstructive sleep apnea) 06/12/2016  . Snoring 06/11/2016  . Other fatigue 03/26/2016  . Medication management 11/06/2015  . Resistant hypertension 11/06/2015  . Excessive daytime sleepiness 11/06/2015  . Murmur 08/11/2015  . PVCs (premature ventricular contractions) 08/11/2015  . Shortness of breath 08/11/2015  . Essential hypertension 08/11/2015    Past Medical History:  Diagnosis Date  . Anxiety   . Depression   . Fibroids   . Hypertension   . Urticaria     Past Surgical History:  Procedure Laterality Date  . [place]; 2000  . MYOMECTOMY  1992    Social History   Social History  . Marital status: Legally Separated    Spouse name: N/A  . Number of children: N/A  . Years of education: N/A   Occupational History  .      works Government social research officer   Social History Main Topics  . Smoking status: Never Smoker  . Smokeless tobacco: Never Used  . Alcohol use No  . Drug use: No  . Sexual activity: No   Other Topics Concern  . Not on file   Social History Narrative   epworth sleepiness scale = 18 (08/11/15)    Family History  Problem Relation Age  of Onset  . Hypertension Mother   . Renal Disease Mother   . Schizophrenia Mother   . Hypertension Father   . Heart disease Father        also OSA  . Hypertension Maternal Grandmother   . Heart attack Maternal Grandmother   . Thyroid disease Maternal Grandmother        thyroidectomy  . Hypertension Maternal Grandfather   . Multiple myeloma Maternal Grandfather   . Sleep apnea Maternal Grandfather   . Hypertension Sister   . Drug abuse Maternal Aunt      Review of Systems  Constitutional: Negative.  Negative for chills, fever and weight loss.  HENT: Negative.  Negative for sore  throat.   Eyes: Negative.  Negative for blurred vision and double vision.  Respiratory: Negative.  Negative for cough and shortness of breath.   Cardiovascular: Negative.  Negative for chest pain and palpitations.  Gastrointestinal: Negative for abdominal pain, diarrhea, nausea and vomiting.  Genitourinary: Negative for dysuria and hematuria.  Musculoskeletal: Negative for back pain, joint pain, myalgias and neck pain.  Skin: Negative.  Negative for rash.  Neurological: Negative.  Negative for dizziness and headaches.  Endo/Heme/Allergies: Negative.   All other systems reviewed and are negative.     Vitals:   12/30/16 1629  BP: 127/79  Pulse: 64  Resp: 16  Temp: 98.1 F (36.7 C)  SpO2: 94%    Physical Exam  Constitutional: She is oriented to person, place, and time. She appears well-developed and well-nourished.  HENT:  Head: Normocephalic and atraumatic.  Eyes: Pupils are equal, round, and reactive to light. EOM are normal.  Neck: Normal range of motion. Neck supple.  Cardiovascular: Normal rate and regular rhythm.   Pulmonary/Chest: Effort normal and breath sounds normal.  Musculoskeletal: Normal range of motion.  Neurological: She is alert and oriented to person, place, and time.  Skin: Skin is warm and dry. Capillary refill takes less than 2 seconds. No rash noted.  Psychiatric: She has a normal mood and affect. Her behavior is normal.  Vitals reviewed.    ASSESSMENT & PLAN: Jleigh was seen today for follow-up.  Diagnoses and all orders for this visit:  Idiopathic hypersomnia  Need for prophylactic vaccination and inoculation against influenza -     Flu Vaccine QUAD 36+ mos IM  GAD (generalized anxiety disorder)  Depression, unspecified depression type  Essential hypertension  Excessive daytime sleepiness  Obesity (BMI 30.0-34.9)  Sleep apnea, unspecified type   Extensive paperwork reviewed and filled out with patient's help and approval. A total  of 30 minutes of face to face time, greater than 50% of entire office visit, was spent with patient discussing test results, treatment medications, consult results and overall physical improvement.    [name], MD [facility]

## 2016-12-31 ENCOUNTER — Telehealth: Payer: Self-pay | Admitting: Internal Medicine

## 2016-12-31 ENCOUNTER — Other Ambulatory Visit (HOSPITAL_COMMUNITY): Payer: Self-pay

## 2016-12-31 ENCOUNTER — Other Ambulatory Visit (HOSPITAL_COMMUNITY): Payer: 59 | Admitting: Psychiatry

## 2016-12-31 DIAGNOSIS — F410 Panic disorder [episodic paroxysmal anxiety] without agoraphobia: Secondary | ICD-10-CM

## 2016-12-31 DIAGNOSIS — F331 Major depressive disorder, recurrent, moderate: Secondary | ICD-10-CM | POA: Diagnosis not present

## 2016-12-31 MED ORDER — BUPROPION HCL ER (XL) 150 MG PO TB24: 150.0000 mg | ORAL_TABLET | ORAL | 0 refills | Status: DC

## 2016-12-31 NOTE — Telephone Encounter (Signed)
Received [employer] Restriction Forms for Dr Debara Pickett to review, complete and sign.  Form given to [facility] for Dr Debara Pickett.  lp

## 2017-01-01 ENCOUNTER — Other Ambulatory Visit (HOSPITAL_COMMUNITY): Payer: 59 | Admitting: Psychiatry

## 2017-01-01 DIAGNOSIS — F331 Major depressive disorder, recurrent, moderate: Secondary | ICD-10-CM | POA: Diagnosis not present

## 2017-01-01 DIAGNOSIS — F410 Panic disorder [episodic paroxysmal anxiety] without agoraphobia: Secondary | ICD-10-CM

## 2017-01-02 ENCOUNTER — Other Ambulatory Visit (HOSPITAL_COMMUNITY): Payer: 59 | Admitting: Psychiatry

## 2017-01-02 DIAGNOSIS — F331 Major depressive disorder, recurrent, moderate: Secondary | ICD-10-CM | POA: Diagnosis not present

## 2017-01-02 DIAGNOSIS — F410 Panic disorder [episodic paroxysmal anxiety] without agoraphobia: Secondary | ICD-10-CM

## 2017-01-03 ENCOUNTER — Telehealth: Payer: Self-pay | Admitting: Allergy

## 2017-01-03 ENCOUNTER — Telehealth: Payer: Self-pay | Admitting: Internal Medicine

## 2017-01-03 ENCOUNTER — Telehealth: Payer: Self-pay | Admitting: Cardiovascular Disease

## 2017-01-03 ENCOUNTER — Other Ambulatory Visit: Payer: Self-pay

## 2017-01-03 ENCOUNTER — Other Ambulatory Visit (HOSPITAL_COMMUNITY): Payer: 59

## 2017-01-03 DIAGNOSIS — L501 Idiopathic urticaria: Secondary | ICD-10-CM

## 2017-01-03 MED ORDER — RANITIDINE HCL 150 MG PO TABS: 150.0000 mg | ORAL_TABLET | Freq: Two times a day (BID) | ORAL | 0 refills | Status: DC

## 2017-01-03 MED ORDER — MONTELUKAST SODIUM 10 MG PO TABS: 10.0000 mg | ORAL_TABLET | Freq: Every day | ORAL | 0 refills | Status: DC

## 2017-01-03 NOTE — Telephone Encounter (Signed)
Received faxed requesting 90 day supply of ranitidine 150mg  and montelukast 10 mg, script sent to pharmacy.

## 2017-01-03 NOTE — Telephone Encounter (Signed)
Spoke to patient.  She notes she has recurrent hives, for which she regularly uses zyrtec and zantac. However, she does not typically get facial itching. States she got facial itching on Monday. She had started on new prescription of Armodafinil (Nuvigil) prescribed by Dr. Claiborne Billings. Also had a flu shot that same day. Wanted to make sure facial itching was not a SE of the Nuvigil. I did advise patient, after discussing w Raquel, pharmD, to hold Nuvigil for 48 hours & monitor for improvement, continue to take antihistamines as needed (per Raquel's instruction). Pt will do so. Notes she really needs Nuvigil (or alternative medication to treat her daytime sleepiness) during work week. She'll trial hold over the weekend but plans to resume medication Tuesday before she goes back to work. I advised this was fine, but asked that she update [place] on Tuesday with her progress. I let her know she can call office and ask for me.   CC Dr. Claiborne Billings  - to see if any suggestions for alternative to [facility], if needed.

## 2017-01-03 NOTE — Telephone Encounter (Signed)
Pt says her insurance is denying her Bystolic and she says she needs too stay on it.

## 2017-01-03 NOTE — Telephone Encounter (Signed)
Spoke to patient about Bystolic. She notes she got a letter from her insurance co recommending alternative medications to the bystolic. She is not sure if this is a true denial letter or a prior authorization alert, etc.  I informed patient I would call her filling pharmacy ([facility]) to see if they got a PA notice for this medication. They have not. The pharmacy tech advised to have the patient plan to fill the medication as scheduled (she is due for refill in about 1 week). If pharmacy receives PA alert from insurance co, they will forward to [place] as is typical for processing.  I have informed patient. Also let her know that typically we have Bystolic samples available (will set aside 2 boxes at triage desk for her just in case). Pt expressed understanding and thanks for assistance.

## 2017-01-03 NOTE — Progress Notes (Signed)
    Daily Group Progress Note  Program: IOP  Group Time: 9:00-12:00   Participation Level: Active   Behavioral Response: Resistant   Type of Therapy:  Group Therapy   Summary of Progress: Pt presented as somewhat withdrawn and guarded.  When counselor and group members expressed concern, pt became tearful and said that she has an important decision to make, but she does not want to share the specifics.  Pt connected to other group members' experiences of struggles in the workplace and feeling undervalued and disrespected.  Pt shared that she is having issues with her 53 year old son who lives with her.  Pt also connected with other group members in that she has not told certain people in her family that she is going to therapy, as she was culturally raised to "take worries to God".  Pt engaged in Chaplain's talk about grief and loss.  Pt's father died two weeks ago-she said that she is angry that he promised to leave money for her daughter's car but he did not.  Pt feels that he was being selfish and absent, as he had been at other times in her life.  Nancie Neas, LPC

## 2017-01-03 NOTE — Telephone Encounter (Signed)
Ariana Cohen is calling because she is having a allergic reaction to the medication for her accessive  sleeping. Please call

## 2017-01-07 ENCOUNTER — Other Ambulatory Visit (HOSPITAL_COMMUNITY): Payer: 59 | Attending: Psychiatry | Admitting: Psychiatry

## 2017-01-07 DIAGNOSIS — F331 Major depressive disorder, recurrent, moderate: Secondary | ICD-10-CM | POA: Insufficient documentation

## 2017-01-07 DIAGNOSIS — F410 Panic disorder [episodic paroxysmal anxiety] without agoraphobia: Secondary | ICD-10-CM

## 2017-01-07 NOTE — Telephone Encounter (Signed)
Follow-up to see how she did with antihistamines.  I'm not certain if this is related to the individual but I believe the new vigil should be preferable when compared to Provigil and treatment of her significant excessive daytime sleepiness, and idiopathic hypersomnia.

## 2017-01-07 NOTE — Progress Notes (Signed)
    Daily Group Progress Note  Program: IOP  Group Time: 9:00-12:00  Participation Level: Active  Behavioral Response: Appropriate  Type of Therapy:  Group Therapy  Summary of Progress: Pt. Presents as talkative, engaged in the group process, appropriately tearful. Pt. Discussed challenge of raising 56 year old son who is passive aggressive and disrespectful toward her. Pt. Was receptive to feedback from the group about improving communication with her son by viewing situations from his perspective. Pt. Is also developing understanding importance of prioritizing her self-care as a single mother who has always put the needs of her parents first. Pt. Shared concerns about returning to work.      Nancie Neas, LPC

## 2017-01-07 NOTE — Progress Notes (Signed)
    Daily Group Progress Note  Program: IOP  Group Time: 9:00-12:00   Participation Level: Active   Behavioral Response: Resistant   Type of Therapy:  Group Therapy   Summary of Progress: Pt presented as resistant but generally engaged.  Pt said that she really desires to gain coping skills from the group.  Counselor suggested that the process of group-i.e. relating to others, giving and receiving empathy, etc.-is a coping skill in itself.  Counselor asked if pt feels safe in the group-pt said that she does to an extent.  Counselors asked pt to consider her past counseling relationships and other relationships.  Pt said that she did not like her previous counselor and "never gave her a chance".  Pt also said that her coworkers were/are disrespectful and rude.  Counselors suggested that pt may need to adjust her expectations of others, while maintaining her own personal moral code.  Nancie Neas, LPC

## 2017-01-07 NOTE — Telephone Encounter (Signed)
Left msg for patient to call. 

## 2017-01-08 ENCOUNTER — Other Ambulatory Visit (HOSPITAL_COMMUNITY): Payer: 59 | Admitting: Psychiatry

## 2017-01-08 DIAGNOSIS — F331 Major depressive disorder, recurrent, moderate: Secondary | ICD-10-CM | POA: Diagnosis present

## 2017-01-08 DIAGNOSIS — F410 Panic disorder [episodic paroxysmal anxiety] without agoraphobia: Secondary | ICD-10-CM

## 2017-01-08 NOTE — Progress Notes (Signed)
    Daily Group Progress Note  Program: IOP  Group Time: 9:00-12:00  Participation Level: Active  Behavioral Response: Appropriate  Type of Therapy:  Group Therapy  Summary of Progress: Pt. Presents with brightened affect, talkative, engaged in the group process. Pt. Discussed with the group that her sleep was disturbed last night because she stayed up too late talking on the telephone, but during the conversation she disclosed to a friend that she is receiving therapy and this type of vulnerability was a new experience for her. Pt. Discussed her history of poor self-image which was developed during her childhood and adolescence from messages that she internalized from her parents, teachers, and other significant figures in her life. Pt. Participated in discussion about developing positive self-talk and self-compassion and using daily affirmation to assist with development of self-esteem.      Nancie Neas, LPC

## 2017-01-09 ENCOUNTER — Other Ambulatory Visit (HOSPITAL_COMMUNITY): Payer: 59

## 2017-01-09 NOTE — Progress Notes (Signed)
    Daily Group Progress Note  Program: IOP  Group Time: 9:00-12:00   Participation Level: Active   Behavioral Response: Appropriate   Type of Therapy:  Group Therapy   Summary of Progress: Pt presented as engaged.  Pt asked the group why there is so much stigma around seeking help for mental health.  Pt said that she has a lot of regrets and is not where she "should" be in her career and financial stability.  Pt said her son perceives her as his Chiropractor, when in fact, she is proud of him but does not think he is ready to live on his own.  She wishes she was more financially stable, so that she wouldn't have to decide things like choosing between paying her daughter's tuition or taking a vacation.  Counselor suggested that perhaps pt's son has a difficult time feeling proud and confident in himself, because he does not have a parent who models self-love and self-compassion.  Counselors pointed out that pt is equating self-love and self-worth with her accomplishments, or perceived lack thereof.  Nancie Neas, LPC

## 2017-01-10 ENCOUNTER — Other Ambulatory Visit (HOSPITAL_COMMUNITY): Payer: 59 | Admitting: Psychiatry

## 2017-01-10 DIAGNOSIS — F410 Panic disorder [episodic paroxysmal anxiety] without agoraphobia: Secondary | ICD-10-CM

## 2017-01-10 DIAGNOSIS — F331 Major depressive disorder, recurrent, moderate: Secondary | ICD-10-CM | POA: Diagnosis not present

## 2017-01-10 NOTE — Progress Notes (Signed)
Patient ID: [name], female   DOB: 1960-09-22, 56 y.o.   MRN: [id number]  [facility] IOP DISCHARGE NOTE  Patient:  Ariana Cohen DOB:  1960/12/09  Date of Admission: 12/25/2016 Date of Discharge: 01/10/2017  Reason for Admission:depression  IOP Course:attended and participated.  Good progress with decreased depression but wishes she could stay longer to get more improvement  Mental Status at Discharge:no suicidal thinking  Diagnosis: major depression recurrent moderate Level of Care:  IOP  Discharge destination: has appointment with provider and therapist    Comments:  Good improvement  The patient received suicide prevention pamphlet:  Yes   Donnelly Angelica, MD

## 2017-01-10 NOTE — Progress Notes (Signed)
Ariana Cohen is a 56 y.o. , divorced African American female who was referred per therapist (Dr. Eloise Levels); treatment for worsening depressive and anxiety symptoms.  Denied SI.  Multiple stressors: 1) Job ([employer]) of 5 yrs.  Been out on short term disability since February 2018.  Reports she started a new position in Sept 2017; reports increased work overload.  "They don't want me back because of my mistakes."  2)  Unresolved grief/loss issues:  Father passed d/t Congestive Heart Failure; was buried this past Sunday.  Had been ill for three months.  Mother is currently residing in an assisted living home d/t Alzheimer's Disease.  She was residing with pt, until pt moved her there in March 2018.  Mother also has Schizophrenia.  3)  Multiple Medical Issues.  Family Issues:  Brother (addiction) and Mother (Schizophrenia). Pt completed MH-IOP today.  Admission Burns:  30 and Discharge:  21.  Pt reports overall mood improved.  Denies SI/HI or A/V hallucinations.  Admits to feeling anxious about returning to work on 01-13-17.  Pt states [place]  Goals:  Pt states she is working on applying coping skills she has learned in the groups.  Plans to start attending bible study regularly.  A:  D/C today.  F/U with Dr. Adele Schilder on 01-28-17 @ 8 am and Eloise Levels, PhD on 01-21-17 @ 2 pm.  Encouraged support groups.  RTW on 01-13-17; with restrictions that are listed on RTW form that was completed.  R:  Pt receptive.                                                         Carlis Abbott, Ariana Cohen, M.Ed, CNA

## 2017-01-10 NOTE — Patient Instructions (Signed)
D:  Pt successfully completed MH-IOP today.  A:  Follow up with Eloise Levels, PhD on 01-21-17 @ 2 pm and Dr. Adele Schilder on 01-28-17 @ 8 a.m.  Encouraged support groups.  Return to Work on 01-13-17, with restrictions that are indicated on RTW form.  R:  Pt receptive.

## 2017-01-13 ENCOUNTER — Other Ambulatory Visit (HOSPITAL_COMMUNITY): Payer: 59

## 2017-01-14 ENCOUNTER — Other Ambulatory Visit (HOSPITAL_COMMUNITY): Payer: 59

## 2017-01-15 ENCOUNTER — Other Ambulatory Visit (HOSPITAL_COMMUNITY): Payer: 59

## 2017-01-15 ENCOUNTER — Telehealth: Payer: Self-pay | Admitting: Cardiovascular Disease

## 2017-01-15 NOTE — Progress Notes (Signed)
    Daily Group Progress Note  Program: IOP  Group Time: 9:00-12:00   Participation Level: Active   Behavioral Response: Appropriate   Type of Therapy:  Group Therapy   Summary of Progress: Pt presented as engaged and positive.  Pt said she planned to go to the [employer] this weekend.  Pt listened to other pts and offered thoughtful advice.  Pt expressed gratitude for her time in IOP and said that she plans to implement meditation and self-care practices in her daily life more.  Pt said she even purchased a small plaque for herself that says "You are lovely".  Pt said, "If I can take care of everyone else, I can care for myself."  Counselors and other group members expressed affirmation of pt and appreciation of her presence in group, as it was her last day in IOP.  Nancie Neas, LPC

## 2017-01-15 NOTE — Telephone Encounter (Signed)
New Message     Pt states her sleep medication is not working  Pt c/o medication issue:  1. Name of Medication:  Armodafinil 150 MG tablet Take 1 tablet (150 mg total) by mouth daily after breakfast.     2. How are you currently taking this medication (dosage and times per day)? 1 tablet daily   3. Are you having a reaction (difficulty breathing--STAT)?  no  4. What is your medication issue? She is sleeping to much, does not think this is working

## 2017-01-15 NOTE — Telephone Encounter (Signed)
Pt states that she has been taking for a few weeks and does not work. She would like to know if he has any other recommendations. Pt will await his return to see what he suggests.

## 2017-01-16 ENCOUNTER — Other Ambulatory Visit (HOSPITAL_COMMUNITY): Payer: 59

## 2017-01-17 ENCOUNTER — Other Ambulatory Visit (HOSPITAL_COMMUNITY): Payer: 59

## 2017-01-20 ENCOUNTER — Telehealth: Payer: Self-pay | Admitting: *Deleted

## 2017-01-20 ENCOUNTER — Other Ambulatory Visit (HOSPITAL_COMMUNITY): Payer: 59

## 2017-01-20 NOTE — Telephone Encounter (Signed)
patient called  From previous message. Spoke to patient. Patient states she went to [place] was denied. Per patient, pharmacy has sent form to prior authorization.   Patient states she does not want to change because medication is working.   RN informed patient samples are available to pick up. Will defer to DR Hilty's nurse to follow up. Until discusion has been made in regards to medications

## 2017-01-20 NOTE — Telephone Encounter (Signed)
F/u Message  Pt call requesting to speak with Ovid Curd. Pt states her insurance has denied her medication bystolic and was told she would be about to get samples. Please call back to discuss

## 2017-01-20 NOTE — Telephone Encounter (Signed)
Called [facility] to find out if Bystolic needs prior authorization as I have not yet received notice. Bystolic is non-formulary.   [phone]

## 2017-01-20 NOTE — Telephone Encounter (Addendum)
Spoke to patient.  [facility]STO[facility]  Is being taking care by  Dr HILty;s nurse. See other note 9/171/18.  patient still want Dr Tillman Abide to know that the nuvgil is not working.  patient states she is still fatigue and sleep during the day.  patient also states she is using antihistamines e twice a day. Allegra  x3 in the morning ( out of meds for a week) Zyrtec X 2 AT at night   both are for hives per patient.  patient aware Dr Claiborne Billings is out of office. Will defer to him and nurse

## 2017-01-21 ENCOUNTER — Other Ambulatory Visit (HOSPITAL_COMMUNITY): Payer: 59

## 2017-01-21 ENCOUNTER — Ambulatory Visit (INDEPENDENT_AMBULATORY_CARE_PROVIDER_SITE_OTHER): Payer: 59 | Admitting: Psychiatry

## 2017-01-21 DIAGNOSIS — F410 Panic disorder [episodic paroxysmal anxiety] without agoraphobia: Secondary | ICD-10-CM

## 2017-01-21 DIAGNOSIS — F419 Anxiety disorder, unspecified: Secondary | ICD-10-CM | POA: Diagnosis not present

## 2017-01-21 NOTE — Telephone Encounter (Signed)
Called [facility] and submitted PA over the phone A faxed notification of outcome will be received in 24-48 business hours

## 2017-01-21 NOTE — Telephone Encounter (Signed)
Consider increasing Nuvigil dose to 200 mg daily.  Make certain patient is sleeping, adequate sleep duration at night.

## 2017-01-22 ENCOUNTER — Other Ambulatory Visit (HOSPITAL_COMMUNITY): Payer: 59

## 2017-01-23 ENCOUNTER — Ambulatory Visit: Payer: Managed Care, Other (non HMO) | Admitting: Allergy

## 2017-01-23 MED ORDER — ARMODAFINIL 200 MG PO TABS: 200.0000 mg | ORAL_TABLET | Freq: Every day | ORAL | 3 refills | Status: DC

## 2017-01-23 NOTE — Telephone Encounter (Signed)
Patient aware of recommendations and verbalized understanding. New rx called into pharmacy.

## 2017-01-24 ENCOUNTER — Other Ambulatory Visit (HOSPITAL_COMMUNITY): Payer: 59

## 2017-01-27 ENCOUNTER — Other Ambulatory Visit (HOSPITAL_COMMUNITY): Payer: 59

## 2017-01-27 NOTE — Progress Notes (Signed)
   THERAPIST PROGRESS NOTE  Session Time: 2:10-3:00  Participation Level: Active  Behavioral Response: Well GroomedAlertEuthymic  Type of Therapy: Individual Therapy  Treatment Goals addressed: Coping  Interventions: CBT  Summary: Ariana Cohen is a 56 y.o. female who presents with anxiety, depression.   Suicidal/Homicidal: Nowithout intent/plan  Therapist Response: Pt.'s presents for first session since IOP. Pt. Reports that she continues to do well with managing her depression. Pt. Reports that she is on long term disability with her employment and is unhappy with how HR handled with disclosure of her mental health status to her manager. Pt. Requested accommodations, but wanted the accommodations made without indication of the nature of her diagnosis or private health information. Pt. Discussed writing email to her HR to express her dissatisfaction, but expressed awareness that this likely would not change the outcome. Pt. Discussed that she is happy with the outcome and looking forward to interview with local company in her field. Pt. Discussed ongoing relationships with her son and daughter, particularly with her son who continues to be very judgmental of her. Significant time in session was spent discussing Pt.'s responses to her son's comments to her, Pt.'s awareness that her son is very insecure, and learning to interpret her son's comments as not what they mean about her, but what the comments mean about how he is feeling about himself. Pt. Continues with plan to attend yoga and meditation and to include her son.  Plan: Return again in 2 weeks.  Diagnosis: Anxiety Disorder NOS    Nancie Neas, [facility] 01/27/2017

## 2017-01-28 ENCOUNTER — Encounter (HOSPITAL_COMMUNITY): Payer: Self-pay | Admitting: Psychiatry

## 2017-01-28 ENCOUNTER — Ambulatory Visit (INDEPENDENT_AMBULATORY_CARE_PROVIDER_SITE_OTHER): Payer: 59 | Admitting: Psychiatry

## 2017-01-28 ENCOUNTER — Other Ambulatory Visit (HOSPITAL_COMMUNITY): Payer: 59

## 2017-01-28 VITALS — BP 114/70 | HR 69 | Ht 66.0 in | Wt 220.0 lb

## 2017-01-28 DIAGNOSIS — F410 Panic disorder [episodic paroxysmal anxiety] without agoraphobia: Secondary | ICD-10-CM

## 2017-01-28 DIAGNOSIS — Z818 Family history of other mental and behavioral disorders: Secondary | ICD-10-CM | POA: Diagnosis not present

## 2017-01-28 DIAGNOSIS — G4710 Hypersomnia, unspecified: Secondary | ICD-10-CM

## 2017-01-28 DIAGNOSIS — F411 Generalized anxiety disorder: Secondary | ICD-10-CM

## 2017-01-28 DIAGNOSIS — Z79899 Other long term (current) drug therapy: Secondary | ICD-10-CM | POA: Diagnosis not present

## 2017-01-28 DIAGNOSIS — F339 Major depressive disorder, recurrent, unspecified: Secondary | ICD-10-CM | POA: Diagnosis not present

## 2017-01-28 DIAGNOSIS — Z813 Family history of other psychoactive substance abuse and dependence: Secondary | ICD-10-CM

## 2017-01-28 MED ORDER — BUPROPION HCL ER (XL) 300 MG PO TB24: 300.0000 mg | ORAL_TABLET | ORAL | 0 refills | Status: DC

## 2017-01-28 MED ORDER — BUSPIRONE HCL 5 MG PO TABS: 5.0000 mg | ORAL_TABLET | Freq: Every day | ORAL | 0 refills | Status: DC

## 2017-01-28 NOTE — Progress Notes (Signed)
BH MD/PA/NP OP Progress Note  01/28/2017 9:04 AM Ariana Cohen  MRN:  [id number]  Chief Complaint: I am not taking Wellbutrin.  I'm anxious and I have no energy. Chief Complaint    Follow-up     HPI: Ariana Cohen came for her follow-up appointment.  She recently finished intensive outpatient program and there has been no changes in her medication when she was in the program.  She's not taking Wellbutrin because she felt that she does not need it because she is not depressed however she admitted having anxiety and nervousness.  She endorse panic attacks she gets very nervous and anxious.  She endorse poor attention, concentration and decreased energy.  Patient was explained that Wellbutrin and also helps the anxiety and she should take it.  She took few times lorazepam but it did not work.  Patient has multiple health issues.  She has sleep study and she was told that she has borderline sleep apnea and hypersomnia.  She was giving stimulant when she has not seen any improvement.  She continues to have excessive sleep during the day and recently her modafinil was increased.  Her fantasy different remains the same.  She still going every day to visit her mother who has dementia and lives in assisted living facility.  She is actively looking for a new job and she had an interview this coming Friday.  She is living with her 73 year old son who is upset because patient got separated from her husband.  Patient denies any hallucination, paranoia, suicidal thoughts or homicidal thought.  However sometime she admitted sad and tearful.  She she wants to medicine that helped her attention, focus and anxiety symptoms.  She never had psychological testing to rule out ADD.  We discussed to have testing but patient is not sure if her insurance will cover.  Patient denies drinking alcohol or using any illegal substances.  She is seeing cardiologist and she has PVCs and barber.  Visit Diagnosis:    ICD-10-CM   1. GAD  (generalized anxiety disorder) F41.1 buPROPion (WELLBUTRIN XL) 300 MG 24 hr tablet    busPIRone (BUSPAR) 5 MG tablet  2. Panic attack F41.0 buPROPion (WELLBUTRIN XL) 300 MG 24 hr tablet    Past Psychiatric History: Reviewed. Patient reported history of anxiety and depression and she has taken Zoloft, Klonopin, Paxil, lorazepam in the past.  She reported Klonopin makes her very sleepy and Ativan did not help her.  Patient denies any history of suicidal attempt or any inpatient psychiatric treatment, mania, psychosis or any hallucination.  Past Medical History:  Past Medical History:  Diagnosis Date  . Anxiety   . Depression   . Fibroids   . Hypertension   . Urticaria     Past Surgical History:  Procedure Laterality Date  . [place]; 2000  . MYOMECTOMY  1992    Family Psychiatric History: Reviewed.  Family History:  Family History  Problem Relation Age of Onset  . Hypertension Mother   . Renal Disease Mother   . Schizophrenia Mother   . Hypertension Father   . Heart disease Father        also OSA  . Hypertension Maternal Grandmother   . Heart attack Maternal Grandmother   . Thyroid disease Maternal Grandmother        thyroidectomy  . Hypertension Maternal Grandfather   . Multiple myeloma Maternal Grandfather   . Sleep apnea Maternal Grandfather   . Hypertension Sister   . Drug  abuse Maternal Aunt     Social History:  Social History   Social History  . Marital status: Legally Separated    Spouse name: N/A  . Number of children: N/A  . Years of education: N/A   Occupational History  .      works Government social research officer   Social History Main Topics  . Smoking status: Never Smoker  . Smokeless tobacco: Never Used  . Alcohol use No  . Drug use: No  . Sexual activity: No   Other Topics Concern  . None   Social History Narrative   epworth sleepiness scale = 18 (08/11/15)    Allergies:  Allergies  Allergen Reactions  . Norvasc [Amlodipine Besylate]  Swelling  . Aspirin Hives  . Minoxidil Other (See Comments)    Weight gain, extreme edema  Legs, feet, hands, lethargic      Metabolic Disorder Labs: Recent Results (from the past 2160 hour(s))  CP Chronic Urticaria index panel     Status: None   Collection Time: 10/31/16  2:53 PM  Result Value Ref Range   TSH 0.64 mIU/L    Comment:   Reference Range   > or = 20 Years  0.40-4.50   Pregnancy Range First trimester  0.26-2.66 Second trimester 0.55-2.73 Third trimester  0.43-2.91      Thyroperoxidase Ab SerPl-aCnc <1 <9 IU/mL   Thyroglobulin Ab <1 <2 IU/mL   Histamine Release CANCELED     Comment: NO RT  Result canceled by the ancillary   Comprehensive metabolic panel     Status: Abnormal   Collection Time: 10/31/16  2:53 PM  Result Value Ref Range   Sodium 140 135 - 146 mmol/L   Potassium 3.8 3.5 - 5.3 mmol/L   Chloride 102 98 - 110 mmol/L   CO2 29 20 - 31 mmol/L   Glucose, Bld 102 (H) 65 - 99 mg/dL   BUN 15 7 - 25 mg/dL   Creat 0.79 0.50 - 1.05 mg/dL    Comment:   For patients > or = 56 years of age: The upper reference limit for Creatinine is approximately 13% higher for people identified as African-American.      Total Bilirubin 0.7 0.2 - 1.2 mg/dL   Alkaline Phosphatase 112 33 - 130 U/L   AST 24 10 - 35 U/L   ALT 25 6 - 29 U/L   Total Protein 7.3 6.1 - 8.1 g/dL   Albumin 4.0 3.6 - 5.1 g/dL   Calcium 9.4 8.6 - 10.4 mg/dL  CBC with Differential/Platelet     Status: Abnormal   Collection Time: 10/31/16  2:53 PM  Result Value Ref Range   WBC 9.9 3.8 - 10.8 K/uL   RBC 5.26 (H) 3.80 - 5.10 MIL/uL   Hemoglobin 14.3 11.7 - 15.5 g/dL   HCT 44.0 35.0 - 45.0 %   MCV 83.7 80.0 - 100.0 fL   MCH 27.2 27.0 - 33.0 pg   MCHC 32.5 32.0 - 36.0 g/dL   RDW 15.6 (H) 11.0 - 15.0 %   Platelets 332 140 - 400 K/uL   MPV 8.8 7.5 - 12.5 fL   Neutro Abs 7,524 1,500 - 7,800 cells/uL   Lymphs Abs 1,683 850 - 3,900 cells/uL   Monocytes Absolute 594 200 - 950 cells/uL    Eosinophils Absolute 99 15 - 500 cells/uL   Basophils Absolute 0 0 - 200 cells/uL   Neutrophils Relative % 76 %   Lymphocytes Relative 17 %   Monocytes  Relative 6 %   Eosinophils Relative 1 %   Basophils Relative 0 %   Smear Review Criteria for review not met   Tryptase     Status: None   Collection Time: 10/31/16  2:53 PM  Result Value Ref Range   Tryptase 2.5 <11 ug/L    Comment:   Footnotes:  (1)        ----------------------------------------------------      Class   Specific IgE (kU/L)   Level      ----------------------------------------------------      0       <0.10                 Absent or undetectable      0/1     0.10  -   0.34        Equivocal/Borderline      1       0.35  -   0.69        Low      2       0.70  -   3.49        Moderate      3       3.50  -  17.49        High      4       17.50 -  49.99        Very High      5       50.00 - 100.00        Very High      6       >100.00               Very High   Allergen Zone 3     Status: Abnormal   Collection Time: 10/31/16  2:53 PM  Result Value Ref Range   Allergen, D pternoyssinus,d7 2.41 (H) kU/L   D. farinae 2.16 (H) kU/L   Cat Dander <0.10 kU/L   Dog Dander <0.10 kU/L   [place] Grass <0.10 kU/L   Meadow Grass <0.10 kU/L   Johnson Grass <0.10 kU/L   [place] Grass <0.10 kU/L   Cockroach 1.66 (H) kU/L   Allergen, P. notatum, m1 <0.10 kU/L   Allergen, C. Herbarum, M2 <0.10 kU/L   Aspergillus fumigatus, m3 <0.10 kU/L   Allergen, Mucor Racemosus, M4 <0.10 kU/L   Allergen, A. alternata, m6 <0.10 kU/L   Allergen, S. Botryosum, m10 <0.10 kU/L   Box Elder IgE <0.10 kU/L   Allergen, Comm Silver Wendee Copp, t9 <0.10 kU/L   Allergen, Cedar tree, t12 0.11 (H) kU/L   Allergen, Oak,t7 <0.10 kU/L   Elm IgE <0.10 kU/L   Pecan/Hickory Tree IgE <0.10 kU/L   Allergen, Mulberry, t76 <0.10 kU/L   Common Ragweed <0.10 kU/L   Plantain <0.10 kU/L   Rough Pigweed  IgE 0.18 (H) kU/L   Nettle <0.10 kU/L   Allergen, Black  Locust, Acacia9 <0.10 kU/L  Sedimentation rate     Status: None   Collection Time: 10/31/16  2:53 PM  Result Value Ref Range   Sed Rate 27 0 - 30 mm/hr  Histamine Rel. (Chronic Urticaria)     Status: None   Collection Time: 11/13/16  3:04 PM  Result Value Ref Range   Histamine Release <16 <16 %    Comment:   This test was developed and its analytical performance characteristics have been determined by [facility]. It has not been cleared or approved by FDA. This assay has been validated pursuant to the CLIA regulations and is used for clinical purposes.    No results found for: HGBA1C, MPG No results found for: PROLACTIN No results found for: CHOL, TRIG, HDL, CHOLHDL, VLDL, LDLCALC Lab Results  Component Value Date   TSH 0.64 10/31/2016   TSH 1.13 10/02/2016    Therapeutic Level Labs: No results found for: LITHIUM No results found for: VALPROATE No components found for:  CBMZ  Current Medications: Current Outpatient Prescriptions  Medication Sig Dispense Refill  . Armodafinil 200 MG TABS Take 200 mg by mouth daily. 30 tablet 3  . BYSTOLIC 10 MG tablet TAKE 1 TABLET (10 MG TOTAL) BY MOUTH DAILY. 90 tablet 3  . cetirizine (ZYRTEC) 10 MG tablet Take 10 mg by mouth daily.    . hydrALAZINE (APRESOLINE) 50 MG tablet Take 1 tablet (50 mg total) by mouth 3 (three) times daily. 270 tablet 1  . montelukast (SINGULAIR) 10 MG tablet Take 1 tablet (10 mg total) by mouth at bedtime. 90 tablet 0  . ranitidine (ZANTAC 150 MAXIMUM STRENGTH) 150 MG tablet Take 1 tablet (150 mg total) by mouth 2 (two) times daily. 180 tablet 0  . spironolactone (ALDACTONE) 25 MG tablet Take 25 mg by mouth daily.    Marland Kitchen telmisartan-hydrochlorothiazide (MICARDIS HCT) 80-25 MG tablet TAKE 1 TABLET BY MOUTH DAILY. 90 tablet 1  . buPROPion (WELLBUTRIN XL) 300 MG 24 hr tablet Take 1 tablet (300 mg total) by mouth every morning. 30 tablet 0  . busPIRone (BUSPAR) 5 MG tablet  Take 1 tablet (5 mg total) by mouth daily. 30 tablet 0   No current facility-administered medications for this visit.      Musculoskeletal: Strength & Muscle Tone: within normal limits Gait & Station: normal Patient leans: N/A  Psychiatric Specialty Exam: Review of Systems  Constitutional: Negative.   HENT: Negative.   Respiratory: Negative.   Cardiovascular: Negative.   Genitourinary: Negative.   Musculoskeletal: Negative.   Skin: Negative.   Psychiatric/Behavioral: The patient is nervous/anxious.     Blood pressure 114/70, pulse 69, height '5\' 6"'$  (1.676 m), weight 220 lb (99.8 kg), last menstrual period 06/20/2013, SpO2 96 %.Body mass index is 35.51 kg/m.  General Appearance: Casual  Eye Contact:  Fair  Speech:  Slow  Volume:  Normal  Mood:  Anxious  Affect:  Congruent  Thought Process:  Irrelevant  Orientation:  Full (Time, Place, and Person)  Thought Content: Rumination   Suicidal Thoughts:  No  Homicidal Thoughts:  No  Memory:  Immediate;   Fair Recent;   Fair Remote;   Good  Judgement:  Fair  Insight:  Good  Psychomotor Activity:  Decreased  Concentration:  Concentration: Fair and Attention Span: Fair  Recall:  [employer] of Knowledge: Good  Language: Good  Akathisia:  No  Handed:  Right  AIMS (if indicated): not done  Assets:  Communication Skills Desire for Improvement Housing  ADL's:  Intact  Cognition: WNL  Sleep:  Fair   Screenings: Mini-Mental     Office Visit from 10/01/2016 in [facility]  Total Score (max 30 points )  27    PHQ2-9     Office Visit from 12/30/2016 in [place] [place] from 08/26/2016 in [facility] [place] from 08/05/2016 in [facility] [place] from 07/10/2016 in [facility] [place] from 07/01/2016 in  [facility] at [facility] Total Score  0  '6  4  5  '$ 0  PHQ-9 Total Score  -  '23  18  22  '$ -       Assessment and Plan: Panic attacks.  Major  depressive disorder, recurrent.  Reassurance given.  Discuss the patient about her compliance.  She's not taking Wellbutrin and I explained that Wellbutrin can help her attention, focus, energy and anxiety symptoms.  She promised that she will take it.  I also reviewed collateral information from other providers.  She had sleep study but rule out narcolepsy and sleep apnea but given modafinil 200 mg daily due to hypersomnia.  She's also taking multiple antihistamines and I explained that antihistamine can also cause attention and concentration issues.  I will discontinue Ativan and we will try BuSpar 5 mg daily.  Patient does not want a higher dose because she is sensitive with the medication.  We also discuss psychological testing to rule out ADD and patient will call us once she had clearance from the insurance.  I also recommended counseling and she wants to wait until her insurance didn't approve.  I recommended to call us back if she has any question, concern or if she feels worsening of the symptom.  Discuss safety concern that anytime having active suicidal thoughts or homicidal thoughts and she need to call 911 or go to the local emergency room.  Follow-up in 4-6 weeks.  Time spent 25 minutes.   Telford Archambeau T., MD 01/28/2017, 9:04 AM

## 2017-01-28 NOTE — Telephone Encounter (Signed)
Called [facility] and prior auth request for bystolic was denied on 0/80/22.  Alternatives: Atenolol, Carvedilol, Metoprolol Succinate, Metoprolol Tartrate, Nadolol, Propranolol ER, Propranolol

## 2017-01-29 ENCOUNTER — Other Ambulatory Visit (HOSPITAL_COMMUNITY): Payer: 59

## 2017-01-29 ENCOUNTER — Telehealth (HOSPITAL_COMMUNITY): Payer: Self-pay

## 2017-01-29 NOTE — Telephone Encounter (Signed)
She has a history of fatigue and somnolence on metoprolol - Bystolic has been effective for her and does not cause those side-effects. I would appeal based on this data.  Dr. Lemmie Evens

## 2017-01-29 NOTE — Telephone Encounter (Signed)
Medication management - [facility] stating patient's buspirone is on backorder and requests alternative

## 2017-01-30 ENCOUNTER — Ambulatory Visit: Payer: Self-pay | Admitting: Cardiovascular Disease

## 2017-01-30 ENCOUNTER — Other Ambulatory Visit (HOSPITAL_COMMUNITY): Payer: 59

## 2017-01-30 NOTE — Telephone Encounter (Signed)
Medication management - Telephone call with patient to inform of [facility] that Buspar was on backorder but Dr. Adele Schilder wanted her to get it from another pharmacy since there is no medication directly similar to Buspar. Pt. agreed with plan and will have order transferred to another pharmacy.

## 2017-01-30 NOTE — Telephone Encounter (Signed)
She need to find a different pharmacy.  There is no exactly alternative to BuSpar.

## 2017-01-31 ENCOUNTER — Telehealth: Payer: Self-pay | Admitting: Internal Medicine

## 2017-01-31 ENCOUNTER — Telehealth: Payer: Self-pay | Admitting: Cardiovascular Disease

## 2017-01-31 ENCOUNTER — Other Ambulatory Visit (HOSPITAL_COMMUNITY): Payer: 59

## 2017-01-31 NOTE — Telephone Encounter (Signed)
Medication Samples have been provided to the patient.  Drug name: Bystolic 10mg Qty: 28 LOT: [id number].Date: 02/2018  The patient has been instructed regarding the correct time, dose, and frequency of taking this medication, including desired effects and most common side effects.   Informed patient samples would be available at front desk if needed.  Will route to Dr. Lysbeth Penner nurse to review on PA appeal (see prior telephone note).

## 2017-01-31 NOTE — Telephone Encounter (Signed)
Contacted [facility] about PA/appeal Can attempted PA once more, however in order for PA to be approved will have need to tried/failed at least 3 of the alternatives listed below (she has only tried 1)  In order to process an appeal, a letter of medical necessity (appeal) needs to be faxed to [phone] denoting which meds have been tried + reasons for failure

## 2017-01-31 NOTE — Telephone Encounter (Signed)
Appeal letter composed and faxed.

## 2017-01-31 NOTE — Telephone Encounter (Signed)
New message     Pt c/o medication issue:  1. Name of Medication: medication for excessive sleeping (did not know name of it)  2. How are you currently taking this medication (dosage and times per day)?   3. Are you having a reaction (difficulty breathing--STAT)?  yes  4. What is your medication issue? Makes her face itch

## 2017-01-31 NOTE — Telephone Encounter (Signed)
New message     Her insurance is still denying bp medication, they will not authorize to fill, what other medication can she use   Patient calling the office for samples of medication:   1.  What medication and dosage are you requesting samples for?  bystolic 10mg   2.  Are you currently out of this medication? yes

## 2017-01-31 NOTE — Telephone Encounter (Signed)
Pt called to report still having se's of facial itching on her Nuvigil. She had gone off of it for a while, and then restarted at the original 150mg  dose. Once restarted, she began to have facial itching again.  Pt has not picked up the 200mg  strength prescribed recently by Dr. Claiborne Billings. Pt hoping to see if there is an alternative medication for same purpose of daytime sleepiness that she can take. She notes the 150mg  dose was not giving her adequate help w this.  Pt aware I will route to Dr. Claiborne Billings to review and advise.

## 2017-02-03 ENCOUNTER — Other Ambulatory Visit (HOSPITAL_COMMUNITY): Payer: 59

## 2017-02-04 ENCOUNTER — Other Ambulatory Visit (HOSPITAL_COMMUNITY): Payer: 59

## 2017-02-04 NOTE — Telephone Encounter (Signed)
Pt advised on Dr. Evette Georges recommendations, verbalized understanding and thanks. Pt will follow up w [place] in a couple weeks after she sees how she does off the nuvigil.

## 2017-02-04 NOTE — Telephone Encounter (Signed)
Recommend she hold taking Nuvigil.  We can see what happens to her hypersomnolence.  If this becomes a significant issue again, consider the possibility of Provigil.

## 2017-02-05 ENCOUNTER — Other Ambulatory Visit (HOSPITAL_COMMUNITY): Payer: 59

## 2017-02-06 ENCOUNTER — Other Ambulatory Visit (HOSPITAL_COMMUNITY): Payer: 59

## 2017-02-07 ENCOUNTER — Other Ambulatory Visit (HOSPITAL_COMMUNITY): Payer: 59

## 2017-02-07 ENCOUNTER — Telehealth: Payer: Self-pay | Admitting: Internal Medicine

## 2017-02-07 ENCOUNTER — Ambulatory Visit: Payer: Self-pay | Admitting: Neurology

## 2017-02-07 NOTE — Telephone Encounter (Signed)
If it is not covered and she cannot afford the medication, even with copay card, we could try switching her to carvedilol 12.5 mg BID.  Dr. Lemmie Evens

## 2017-02-07 NOTE — Telephone Encounter (Signed)
Duplicate encounter

## 2017-02-07 NOTE — Telephone Encounter (Signed)
Contact [facility] to follow up on status of Bystolic appeal. Pharmacy/prior authorization staff informed me that appeal has been denied - not medically necessary. Bystolic is only covered if she has tried the required # of alternatives (will need to try 3). She has only tried metoprolol.   Routed to MD for recommendations

## 2017-02-07 NOTE — Telephone Encounter (Signed)
LMTCB

## 2017-02-07 NOTE — Telephone Encounter (Signed)
New message ° ° ° ° °Patient returning call. Please call °

## 2017-02-07 NOTE — Telephone Encounter (Signed)
Patient returned call. She states she she was able to get her bystolic for $97 for a 90 day supply but since [employer] denied her medication, it was going to be $100-125/90 days.   She will double check on cost of medication. She states she is not too keen on the idea of changing Rx. She will call back with update next week

## 2017-02-10 ENCOUNTER — Other Ambulatory Visit (HOSPITAL_COMMUNITY): Payer: 59

## 2017-02-11 ENCOUNTER — Other Ambulatory Visit (HOSPITAL_COMMUNITY): Payer: 59

## 2017-02-12 ENCOUNTER — Other Ambulatory Visit (HOSPITAL_COMMUNITY): Payer: 59

## 2017-02-13 ENCOUNTER — Other Ambulatory Visit (HOSPITAL_COMMUNITY): Payer: 59

## 2017-02-14 ENCOUNTER — Other Ambulatory Visit (HOSPITAL_COMMUNITY): Payer: 59

## 2017-02-17 ENCOUNTER — Other Ambulatory Visit (HOSPITAL_COMMUNITY): Payer: 59

## 2017-02-18 ENCOUNTER — Other Ambulatory Visit (HOSPITAL_COMMUNITY): Payer: 59

## 2017-02-25 ENCOUNTER — Encounter (HOSPITAL_COMMUNITY): Payer: Self-pay | Admitting: Psychiatry

## 2017-02-25 ENCOUNTER — Ambulatory Visit (INDEPENDENT_AMBULATORY_CARE_PROVIDER_SITE_OTHER): Payer: 59 | Admitting: Psychiatry

## 2017-02-25 DIAGNOSIS — F410 Panic disorder [episodic paroxysmal anxiety] without agoraphobia: Secondary | ICD-10-CM | POA: Diagnosis not present

## 2017-02-25 DIAGNOSIS — Z818 Family history of other mental and behavioral disorders: Secondary | ICD-10-CM

## 2017-02-25 DIAGNOSIS — F411 Generalized anxiety disorder: Secondary | ICD-10-CM | POA: Diagnosis not present

## 2017-02-25 MED ORDER — BUPROPION HCL ER (XL) 300 MG PO TB24: 300.0000 mg | ORAL_TABLET | ORAL | 1 refills | Status: DC

## 2017-02-25 MED ORDER — BUSPIRONE HCL 5 MG PO TABS: 5.0000 mg | ORAL_TABLET | Freq: Every day | ORAL | 0 refills | Status: DC

## 2017-02-25 NOTE — Progress Notes (Signed)
[facility] MD/PA/NP OP Progress Note  02/25/2017 8:49 AM Ariana Cohen  MRN:  [id number]  Chief Complaint: I did not get BuSpar because it was back order.  I did not tryto get from a different pharmacy.  HPI: Ariana Cohen came for her follow-up appointment.she is taking Wellbutrin which is helping her attention focus and energy level.  She is not sleeping as much during the day.  She is no longer taking modafinil.  Because it was causing itching.  She is unable to refill her BuSpar because pharmacy told it wasn't back on her and she did not try to get from a different pharmacy.  Overall she is feeling better mood.  She denies any irritability, anger, mania or any psychosis.  She is not interested in psychological testing and she also missed appointment to see Anderson Malta.currently she is on disability since February but she is actively looking for a new job.  She is living with her 31 year old son.  She continues to visit her mother who lives in assisted living facility.patient denies drinking alcohol or using any illegal substances.  She is seeing cardiologist for her PVCs.  She like to continue Wellbutrin.  She denies any paranoia or any hallucination.  She denies any feeling of hopelessness or worthlessness.she denies any major panic attacks since the last visit.   Visit Diagnosis:    ICD-10-CM   1. GAD (generalized anxiety disorder) F41.1 busPIRone (BUSPAR) 5 MG tablet    buPROPion (WELLBUTRIN XL) 300 MG 24 hr tablet  2. Panic attack F41.0 buPROPion (WELLBUTRIN XL) 300 MG 24 hr tablet    Past Psychiatric History: Reviewed. Patient reported history of anxiety and depression and she has taken Zoloft, Klonopin, Paxil, lorazepam in the past.  She reported Klonopin makes her very sleepy and Ativan did not help her.  Patient denies any history of suicidal attempt or any inpatient psychiatric treatment, mania, psychosis or any hallucination.  Past Medical History:  Past Medical History:  Diagnosis Date  .  Anxiety   . Depression   . Fibroids   . Hypertension   . Urticaria     Past Surgical History:  Procedure Laterality Date  . [place]; 2000  . MYOMECTOMY  1992    Family Psychiatric History: reviewed.  Family History:  Family History  Problem Relation Age of Onset  . Hypertension Mother   . Renal Disease Mother   . Schizophrenia Mother   . Hypertension Father   . Heart disease Father        also OSA  . Hypertension Maternal Grandmother   . Heart attack Maternal Grandmother   . Thyroid disease Maternal Grandmother        thyroidectomy  . Hypertension Maternal Grandfather   . Multiple myeloma Maternal Grandfather   . Sleep apnea Maternal Grandfather   . Hypertension Sister   . Drug abuse Maternal Aunt     Social History:  Social History   Social History  . Marital status: Legally Separated    Spouse name: N/A  . Number of children: N/A  . Years of education: N/A   Occupational History  .      works Government social research officer   Social History Main Topics  . Smoking status: Never Smoker  . Smokeless tobacco: Never Used  . Alcohol use No  . Drug use: No  . Sexual activity: No   Other Topics Concern  . Not on file   Social History Narrative   epworth sleepiness scale = 18 (  08/11/15)    Allergies:  Allergies  Allergen Reactions  . Norvasc [Amlodipine Besylate] Swelling  . Aspirin Hives  . Metoprolol Other (See Comments)    Fatigue, somnolence   . Minoxidil Other (See Comments)    Weight gain, extreme edema  Legs, feet, hands, lethargic      Metabolic Disorder Labs: No results found for: HGBA1C, MPG No results found for: PROLACTIN No results found for: CHOL, TRIG, HDL, CHOLHDL, VLDL, LDLCALC Lab Results  Component Value Date   TSH 0.64 10/31/2016   TSH 1.13 10/02/2016    Therapeutic Level Labs: No results found for: LITHIUM No results found for: VALPROATE No components found for:  CBMZ  Current Medications: Current Outpatient Prescriptions   Medication Sig Dispense Refill  . Armodafinil 200 MG TABS Take 200 mg by mouth daily. 30 tablet 3  . buPROPion (WELLBUTRIN XL) 300 MG 24 hr tablet Take 1 tablet (300 mg total) by mouth every morning. 30 tablet 0  . busPIRone (BUSPAR) 5 MG tablet Take 1 tablet (5 mg total) by mouth daily. 30 tablet 0  . BYSTOLIC 10 MG tablet TAKE 1 TABLET (10 MG TOTAL) BY MOUTH DAILY. 90 tablet 3  . cetirizine (ZYRTEC) 10 MG tablet Take 10 mg by mouth daily.    . hydrALAZINE (APRESOLINE) 50 MG tablet Take 1 tablet (50 mg total) by mouth 3 (three) times daily. 270 tablet 1  . montelukast (SINGULAIR) 10 MG tablet Take 1 tablet (10 mg total) by mouth at bedtime. 90 tablet 0  . ranitidine (ZANTAC 150 MAXIMUM STRENGTH) 150 MG tablet Take 1 tablet (150 mg total) by mouth 2 (two) times daily. 180 tablet 0  . spironolactone (ALDACTONE) 25 MG tablet Take 25 mg by mouth daily.    Marland Kitchen telmisartan-hydrochlorothiazide (MICARDIS HCT) 80-25 MG tablet TAKE 1 TABLET BY MOUTH DAILY. 90 tablet 1   No current facility-administered medications for this visit.      Musculoskeletal: Strength & Muscle Tone: within normal limits Gait & Station: normal Patient leans: N/A  Psychiatric Specialty Exam: ROS  Blood pressure 128/72, pulse 62, height _0  (1.676 m), weight 217 lb 3.2 oz (98.5 kg), last menstrual period 06/20/2013.There is no height or weight on file to calculate BMI.  General Appearance: Casual  Eye Contact:  Fair  Speech:  Slow  Volume:  Normal  Mood:  Anxious  Affect:  Appropriate  Thought Process:  Goal Directed  Orientation:  Full (Time, Place, and Person)  Thought Content: Logical   Suicidal Thoughts:  No  Homicidal Thoughts:  No  Memory:  Immediate;   Fair Recent;   Fair Remote;   Fair  Judgement:  Good  Insight:  Good  Psychomotor Activity:  Normal  Concentration:  Concentration: Good and Attention Span: Good  Recall:  Good  Fund of Knowledge: Good  Language: Good  Akathisia:  No  Handed:  Right   AIMS (if indicated): not done  Assets:  Communication Skills Desire for Improvement  ADL's:  Intact  Cognition: WNL  Sleep:  Good   Screenings: Mini-Mental     Office Visit from 10/01/2016 in [facility]  Total Score (max 30 points )  27    PHQ2-9     Office Visit from 12/30/2016 in [facility] at [facility] from 08/26/2016 in [facility] at [facility] from 08/05/2016 in [place] at [facility] from 07/10/2016 in [place] [place] [facility] from 07/01/2016 in [place] at [facility]  PHQ-2 Total Score  0  _0 0  PHQ-9 Total Score  -  _1 -       Assessment and Plan: patient depressive disorder, recurrent.  Panic attacks.  Patient doing better on Wellbutrin.  She has no side effects.  However she has difficulty getting refill her BuSpar.  I provided her hard copy so she can try to a different pharmacy.  She is no longer taking modafinil.  She noticed since taking the Wellbutrin every day her sleep pattern is improved.  I reminded to see a therapist.  She has not seen Anderson Malta in a while.  She does not want to do psychological testing at this time.  Continue Wellbutrin XL 300 mg daily.  Recommended to call us back if she has any question or any concern.  Follow-up in 2 months.  Continue BuSpar 5 mg daily however if she feels it is helping we will consider increasing the dose on her next appointment.   Jarl Sellitto T., MD 02/25/2017, 8:49 AM

## 2017-03-04 ENCOUNTER — Ambulatory Visit (HOSPITAL_COMMUNITY): Payer: Self-pay | Admitting: Psychiatry

## 2017-03-05 ENCOUNTER — Telehealth: Payer: Self-pay | Admitting: Internal Medicine

## 2017-03-05 NOTE — Telephone Encounter (Signed)
Left a message for the patient that samples were available to pick up at her convenience.  Medication Samples have been provided to the patient.  Drug name: Bystolic        Strength: 10 mg         Qty: 4 boxes   LOT: [id number]   Exp.Date: 12/19

## 2017-03-05 NOTE — Telephone Encounter (Signed)
Patient calling the office for samples of medication:   1.  What medication and dosage are you requesting samples for? Bystolic  2.  Are you currently out of this medication? Only a few left

## 2017-03-07 ENCOUNTER — Telehealth: Payer: Self-pay | Admitting: Allergy

## 2017-03-07 NOTE — Telephone Encounter (Signed)
Patient made a payment today for $52.27 and would like an itemized statement for this payment; the dates it covered, mailed to her.

## 2017-03-08 ENCOUNTER — Other Ambulatory Visit (HOSPITAL_COMMUNITY): Payer: Self-pay | Admitting: Psychiatry

## 2017-03-08 DIAGNOSIS — F411 Generalized anxiety disorder: Secondary | ICD-10-CM

## 2017-03-08 DIAGNOSIS — F410 Panic disorder [episodic paroxysmal anxiety] without agoraphobia: Secondary | ICD-10-CM

## 2017-03-10 NOTE — Telephone Encounter (Signed)
Mailed 03/10/2017

## 2017-03-11 ENCOUNTER — Ambulatory Visit (INDEPENDENT_AMBULATORY_CARE_PROVIDER_SITE_OTHER): Payer: 59 | Admitting: Psychiatry

## 2017-03-11 DIAGNOSIS — F411 Generalized anxiety disorder: Secondary | ICD-10-CM | POA: Diagnosis not present

## 2017-03-12 NOTE — Progress Notes (Signed)
   THERAPIST PROGRESS NOTE  Session Time: 2:10-3:00  Participation Level: Active  Behavioral Response: Well GroomedAlertEuthymic  Type of Therapy: Individual Therapy  Treatment Goals addressed: Coping  Interventions: CBT  Summary: Ariana Cohen is a 56 y.o. female who presents with anxiety, depression.   Suicidal/Homicidal: Nowithout intent/plan  Therapist Response: Pt.'s presents with bright mood, energetic, talkative, smiles appropriately. Pt. Reports that she stopped taking welbutrin after increase to higher dose due to rash on face. Pt. Reports that she has not had any negative side effects since stopping medication. Pt. Reports no symptoms of depression. Pt. Reports that she has been meditating regularly and listening to podcasts of inspirational speakers that have helped with her anxiety. Pt. Reports that she reached out to former church members and reconnecting with spirituality. Pt. Discussed that relationships with her children are good. Pt. Dicussed that relationship with her mother with history of severe mental illness and alzheimer's disease is good and continues to see her regularly in memory care unit. Pt. Reports that she had significant stressor since last session with two job interviews that resulted in rejection. Pt. 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   Plan: Return again in 2 weeks.  Diagnosis:      Anxiety Disorder NOS   Nancie Neas, [facility] 03/12/2017

## 2017-03-26 ENCOUNTER — Other Ambulatory Visit (HOSPITAL_COMMUNITY): Payer: Self-pay

## 2017-03-26 DIAGNOSIS — F411 Generalized anxiety disorder: Secondary | ICD-10-CM

## 2017-03-26 MED ORDER — BUSPIRONE HCL 5 MG PO TABS: 5.0000 mg | ORAL_TABLET | Freq: Every day | ORAL | 0 refills | Status: DC

## 2017-04-02 ENCOUNTER — Other Ambulatory Visit: Payer: Self-pay | Admitting: Allergy

## 2017-04-02 DIAGNOSIS — L501 Idiopathic urticaria: Secondary | ICD-10-CM

## 2017-04-02 NOTE — Telephone Encounter (Signed)
Received fax for 90 day supply for montelukast. Patient was seen 10/24/2016. Patient was to return in 2-3 months. Patient needs office visit.

## 2017-04-03 ENCOUNTER — Other Ambulatory Visit: Payer: Self-pay | Admitting: *Deleted

## 2017-04-03 DIAGNOSIS — L501 Idiopathic urticaria: Secondary | ICD-10-CM

## 2017-04-03 MED ORDER — MONTELUKAST SODIUM 10 MG PO TABS: ORAL_TABLET | ORAL | 0 refills | Status: DC

## 2017-04-03 NOTE — Telephone Encounter (Signed)
Per insurance patient has to get 90 day rx dispense for montelukast. Writer sent in this one time only

## 2017-04-08 ENCOUNTER — Telehealth: Payer: Self-pay | Admitting: Internal Medicine

## 2017-04-08 NOTE — Telephone Encounter (Signed)
Returned call to patient Bystolic 10 mg samples left at [facility] office front desk. 

## 2017-04-08 NOTE — Telephone Encounter (Signed)
New message    Patient calling the office for samples of medication:   1.  What medication and dosage are you requesting samples for? BYSTOLIC 10 MG tablet  2.  Are you currently out of this medication? yes

## 2017-04-09 ENCOUNTER — Other Ambulatory Visit: Payer: Self-pay | Admitting: Internal Medicine

## 2017-04-17 ENCOUNTER — Ambulatory Visit (INDEPENDENT_AMBULATORY_CARE_PROVIDER_SITE_OTHER): Payer: 59 | Admitting: Psychiatry

## 2017-04-17 DIAGNOSIS — F411 Generalized anxiety disorder: Secondary | ICD-10-CM | POA: Diagnosis not present

## 2017-04-18 NOTE — Progress Notes (Signed)
   THERAPIST PROGRESS NOTE   Session Time:1:10-2:00  Participation Level:Active  Behavioral Response:Well GroomedAlertEuthymic  Type of Therapy:Individual Therapy  Treatment Goals addressed:Coping  Interventions:CBT  Summary:Ariana R Dixonis a 56 y.o.femalewho presents with anxiety, depression.   Suicidal/Homicidal:Nowithout intent/plan  Therapist Response:Pt. Continues to present with bright mood, mild anxiety. Pt. Attributes her mild anxiety to the fact that she has not found a job yet and she needs to to find a job. Pt. Discussed that she has been on another job interview and is not sure why she has not been successful, not sure if she is being too critical and asking too many questions during the interview process. Pt. Discussed that she stopped taking the welbutrin since the last session at 300 mg due to rash and consulted with Dr. Adele Schilder during session who recommended that she stop taking and and that it was safe for her now to resume at lower dose which Pt. Indicated she would resume. Pt. Indicated that she has not been taking Buspar for anxiety and stopped taking because it was not helpful. Pt. Stated that her anxiety was manageable and that she was comfortable with non-medication interventions such as meditation. Pt. Requested meditation during the session. Counselor guided Pt. Through breath focused meditation used 4-3-8 breathing technique and body scan. Pt. Indicated that this was helpful for her to identify stress that she was holding in different areas of her body and to feel more empowered to shift her emotions from worry to peace.   Plan: Return again in2weeks.  Diagnosis:Anxiety Disorder NOS   Ariana Cohen, [facility] 04/18/2017

## 2017-04-21 ENCOUNTER — Ambulatory Visit: Payer: Managed Care, Other (non HMO) | Admitting: Cardiovascular Disease

## 2017-05-05 ENCOUNTER — Telehealth: Payer: Self-pay | Admitting: Internal Medicine

## 2017-05-05 NOTE — Telephone Encounter (Signed)
Medication samples have been provided to the patient.  Drug name: Bystolic 10 mg   Qty: 28 tablets     LOT: [id number]  Exp.Date: 03/20  Samples left at front desk for patient pick-up. Left detailed message (ok per DPR) to make patient aware.

## 2017-05-05 NOTE — Telephone Encounter (Signed)
New message   Patient calling the office for samples of medication:   1.  What medication and dosage are you requesting samples for?BYSTOLIC 10 MG tablet  2.  Are you currently out of this medication? yes

## 2017-05-06 HISTORY — PX: BREAST BIOPSY: SHX20

## 2017-05-07 ENCOUNTER — Encounter: Payer: Self-pay | Admitting: Internal Medicine

## 2017-05-07 ENCOUNTER — Ambulatory Visit (INDEPENDENT_AMBULATORY_CARE_PROVIDER_SITE_OTHER): Payer: Managed Care, Other (non HMO) | Admitting: Internal Medicine

## 2017-05-07 VITALS — BP 109/72 | HR 61 | Ht 66.0 in | Wt 222.6 lb

## 2017-05-07 DIAGNOSIS — E669 Obesity, unspecified: Secondary | ICD-10-CM | POA: Diagnosis not present

## 2017-05-07 DIAGNOSIS — G4719 Other hypersomnia: Secondary | ICD-10-CM | POA: Diagnosis not present

## 2017-05-07 DIAGNOSIS — I10 Essential (primary) hypertension: Secondary | ICD-10-CM

## 2017-05-07 DIAGNOSIS — R5383 Other fatigue: Secondary | ICD-10-CM | POA: Diagnosis not present

## 2017-05-07 NOTE — Patient Instructions (Signed)
Your physician wants you to follow-up in: ONE YEAR with Dr. Hilty. You will receive a reminder letter in the mail two months in advance. If you don't receive a letter, please call our office to schedule the follow-up appointment.  

## 2017-05-07 NOTE — Progress Notes (Signed)
OFFICE NOTE  Chief Complaint:  Fatigue  Primary Care Physician: Horald Pollen, MD  HPI:  Ariana Cohen is a 57 y.o. female with a history of hypertension, leg edema, hypokalemia (possibly related to Lasix) and excessive daytime sleepiness and fatigue. She's had a number of years it difficult to control hypertension. She's been followed by several primary care providers and recently saw Marijo File, who referred her to [place] for further evaluation. Over the years she's required a number of medications for blood pressure control. Recently she was on Norvasc which she said was helpful although had significant lower extremity swelling. Subsequently she had been put on clonidine which she said made her very sleepy in addition to her normal level of sleepiness. She is also on metoprolol and was placed on Diovan. She takes hydralazine as well. She was prescribed 50 mg 3 times a day however she only takes 100 mg daily. She works third shift and typically when she gets home sleeps for about 4 hours. She says she takes Lasix when she gets home and that often interrupts her sleep to get up to urinate. Recently she's required potassium based on laboratory work which showed some degree of hypokalemia. She generally takes the metoprolol also in the morning when she gets home from work before going to sleep as well as Diovan. She says that she does have difficulty sleeping when she tries to go to sleep. She is waking up several times either to urinate or for other reasons. Then she feels sleepy throughout the day. She oftentimes dozes off. She's noted to snore and denies any gasping or witnessed sleep apnea. Her sleepiness score today was quite high at 18. There is a strong family history of hypertension and her family both her mother and father and both grandparents. She also has a 32 year old sister with hypertension. She denies any chest pain but does get short of breath with exertion. Her EKG today shows  PVCs for which she says she is asymptomatic. There is also evidence of LVH and nonspecific T-wave changes. She last had an echocardiogram in 2009[facility] which showed normal LVEF of 55-60% and mild mitral regurgitation with no evidence of mitral valve prolapse. She does have a mitral murmur.  09/12/2015  Ariana Cohen returns today for follow-up. She reports continued hypertension despite changes in her medications. She is currently on hydralazine 50 mg twice a day, Toprol-XL 50 mg daily and valsartan 320 mg daily. Blood pressure today was 180/100. She's complaining of persistent swelling of her legs. She gets short of breath with activity. Her nuclear stress test was negative for ischemia however demonstrated a low EF of 45%. Echo however demonstrated normal systolic function therefore I suspect a gating abnormality. There was however stage II diastolic dysfunction and mild mitral regurgitation which is evidenced by her murmur. She does have a history of hypokalemia which could be related to Lasix or one must consider a more rare causes of hypertension such as hyperaldosteronism. There is however strong family history of hypertension. I also think she has significant fatigue and would benefit from a sleep study. This is scheduled on 10/09/2015.  11/06/2015  I saw Ariana Cohen back today in follow-up. Her blood pressure remains uncontrolled. She reports no improvement in her swelling with Lasix and to stop taking Lasix and potassium. Recently she's our pharmacist and hydralazine was increased to 100 mg twice a day. She reports strict compliance with medications although there is been questions of  this in the past. We talked about the possible dangers of me increasing her medicine if she was noncompliant and then all of a sudden started taking the medicine. That being said she insisted she was taking the medicine but blood pressure is not well controlled. Recently I did renin and  aldosterone testing which was negative.  03/26/2016  Ariana Cohen was seen back today in follow-up. Her blood pressure remains elevated. It's been very difficult to control. Despite increasing her hydralazine up to 100 mg 3 times a day, she is on max dose Bystolic and max dose Micardis HCT. She's been intolerant to amlodipine due to venous insufficiency and swelling. Previously she been on clonidine short acting pills but had significant fatigue. She likely has sleep apnea but has not yet had her sleep study. That is pending.  06/11/2016  Ariana Cohen returns today for follow-up. Her blood pressure continues to be difficult to manage. She is currently on Catapres TTS 0.2 mg patch with hydralazine 100 mg 3 times a day and Bystolic 10 mg daily. She also takes eye cart his HCTZ 80/25 mg daily. She did see Racquel of pharmacist and her hypertension clinic who recommended substituting low-dose minoxidil to decrease her clonidine however this was significantly intolerant. She developed significant swelling and side effects related to the minoxidil discontinued it. Surprisingly, blood pressure is better controlled today in the office at 138/76, however this morning she noted it was 180/111. Not clear why there are some significant differences in her pressures. She has a very new blood pressure cuff with features to export it to her cell phone and I reviewed a number of blood pressure readings with her in the office. There does seem to be a trend toward morning blood pressure elevations suggesting that she may not be getting full coverage throughout the night.  08/12/2016  Ariana Cohen was seen today for follow-up. She recently had a sleep study which did not clearly show obstructive sleep apnea however Dr. Claiborne Billings wants her to undergo an MSLT test. He added Aldactone for additional blood pressure control which is possibly improved to 158/88 today. She says her numbers are lower at home but occasionally she forgets to use  her clonidine patch. That being said she says she is very compliant with her medicines. She continues to have some lower extremity edema which I think is partially related to venous insufficiency. She has improvement with compression stockings. She was concerned about discoloration in her toenails is a problem possibly related to poor circulation, however on exam it's more consistent with onychomycosis.  11/13/2016  Ariana Cohen returns today for follow-up. I rechecked her blood pressure is low today at 104 was 62. I rechecked it again manually at 100/50. She says she just recently took her hydralazine. She says that she discontinued her Catapres patch recently due to dry mouth and has considered decreasing some of her blood pressure medicines because of low blood pressure. I wonder if this is due to some labile hypertension. She was scheduled and completed a multiple sleep latency test. Those results are still pending. She is being treated for allergies and is on high-dose antihistamines.  12/12/2016  Ariana Cohen was seen today for follow-up. She continues to have low blood pressure which it is unclear if this is contributing to her fatigue. She does today she now gets some shortness of breath although it does not happen routinely and is not significantly worse than it was last year. I indicated that she had had  a stress test and an echo last year however she does not recall it. That test did show a fixed inferior defect with an EF of 45% on the nuclear stress test, thought to be scar or possibly artifact. Her echo however showed normal wall motion and no evidence for scar however she did have stage II diastolic dysfunction. It's possible that her degree of diastolic dysfunction could be associated with shortness of breath on exertion. She recently had a multiple sleep latency study which was abnormal suggesting hypersomnolence. She has a follow-up next week with Dr. Claiborne Billings who will hopefully discuss starting  stimulants with her.  05/07/2017  Ariana Cohen returns today for follow-up.  She continues to have complaints of fatigue.  She saw Dr. Claiborne Billings who started her on Nuvigil.  She said she developed facial itching after 1 dose of the medication and discontinued it.  It is not clear whether it would be helpful for her or not.  Blood pressure is well controlled today.  PMHx:  Past Medical History:  Diagnosis Date  . Anxiety   . Depression   . Fibroids   . Hypertension   . Urticaria     Past Surgical History:  Procedure Laterality Date  . [place]; 2000  . MYOMECTOMY  1992    FAMHx:  Family History  Problem Relation Age of Onset  . Hypertension Mother   . Renal Disease Mother   . Schizophrenia Mother   . Hypertension Father   . Heart disease Father        also OSA  . Hypertension Maternal Grandmother   . Heart attack Maternal Grandmother   . Thyroid disease Maternal Grandmother        thyroidectomy  . Hypertension Maternal Grandfather   . Multiple myeloma Maternal Grandfather   . Sleep apnea Maternal Grandfather   . Hypertension Sister   . Drug abuse Maternal Aunt     SOCHx:   reports that  has never smoked. she has never used smokeless tobacco. She reports that she does not drink alcohol or use drugs.  ALLERGIES:  Allergies  Allergen Reactions  . Norvasc [Amlodipine Besylate] Swelling  . Aspirin Hives  . Metoprolol Other (See Comments)    Fatigue, somnolence   . Minoxidil Other (See Comments)    Weight gain, extreme edema  Legs, feet, hands, lethargic      ROS: Pertinent items noted in HPI and remainder of comprehensive ROS otherwise negative.  HOME MEDS: Current Outpatient Medications  Medication Sig Dispense Refill  . buPROPion (WELLBUTRIN XL) 150 MG 24 hr tablet Take 1 tablet by mouth daily.  0  . BYSTOLIC 10 MG tablet TAKE 1 TABLET (10 MG TOTAL) BY MOUTH DAILY. 90 tablet 3  . cetirizine (ZYRTEC) 10 MG tablet Take 10 mg by mouth daily.    .  hydrALAZINE (APRESOLINE) 100 MG tablet TAKE 1 TABLET (100 MG TOTAL) BY MOUTH 3 (THREE) TIMES DAILY. 30 tablet 0  . montelukast (SINGULAIR) 10 MG tablet TAKE 1 TABLET BY MOUTH EVERYDAY AT BEDTIME 90 tablet 0  . ranitidine (ZANTAC) 150 MG tablet TAKE 1 TABLET BY MOUTH TWICE A DAY 60 tablet 0  . spironolactone (ALDACTONE) 25 MG tablet Take 25 mg by mouth daily.    Marland Kitchen telmisartan-hydrochlorothiazide (MICARDIS HCT) 80-25 MG tablet TAKE 1 TABLET BY MOUTH DAILY. 90 tablet 1   No current facility-administered medications for this visit.     LABS/IMAGING: No results found for this or any previous visit (from the  past 48 hour(s)). No results found.  WEIGHTS: Wt Readings from Last 3 Encounters:  05/07/17 222 lb 9.6 oz (101 kg)  12/30/16 217 lb 6.4 oz (98.6 kg)  12/16/16 217 lb 3.2 oz (98.5 kg)    VITALS: BP 109/72   Pulse 61   Ht '5\' 6"'$  (1.676 m)   Wt 222 lb 9.6 oz (101 kg)   LMP 06/20/2013   BMI 35.93 kg/m   EXAM: General appearance: alert and no distress Neck: no carotid bruit, no JVD and thyroid not enlarged, symmetric, no tenderness/mass/nodules Lungs: clear to auscultation bilaterally Heart: regular rate and rhythm, S1, S2 normal, no murmur, click, rub or gallop Abdomen: soft, non-tender; bowel sounds normal; no masses,  no organomegaly Extremities: extremities normal, atraumatic, no cyanosis or edema Pulses: 2+ and symmetric Skin: Skin color, texture, turgor normal. No rashes or lesions Neurologic: Grossly normal Deferred  EKG: Normal sinus rhythm 61, nonspecific ST and T wave changes-personally reviewed  ASSESSMENT: 1. Labile hypertension 2. Onchomycosis 3. DOE - normal LV systolic function with grade 2 diastolic dysfunction and mild LVH 4. Murmur 5. PVCs 6. Significant daytime sleepiness with concern for sleep apnea 7. Leg edema  PLAN: 1.   Ariana Cohen tends to have significant daytime somnolence and fatigue.  She was prescribed a stimulant by Dr. Claiborne Billings however she had  tingling and pruritus of her face which occurred after the first dose and discontinue the medicine.  She could potentially try Provigil as an alternative, is not clear whether that would cause her symptoms or not.  I will reach out to Dr. Claiborne Billings who may prescribe this as I do not typically prescribe this medication.  Blood pressure is well controlled.  Her dyspnea is stable.  I having encouraged continued activity and weight loss.  Follow-up with me annually or sooner as necessary.  Pixie Casino, MD, [facility], [place] Director of the [facility]  [facility] Attending Cardiologist  Direct Dial: [phone]  Fax: [phone]  Website:  [email or web address] 05/07/2017, 4:46 PM

## 2017-05-08 ENCOUNTER — Ambulatory Visit (INDEPENDENT_AMBULATORY_CARE_PROVIDER_SITE_OTHER): Payer: 59 | Admitting: Psychiatry

## 2017-05-08 DIAGNOSIS — F411 Generalized anxiety disorder: Secondary | ICD-10-CM | POA: Diagnosis not present

## 2017-05-09 ENCOUNTER — Other Ambulatory Visit: Payer: Self-pay | Admitting: Allergy

## 2017-05-09 ENCOUNTER — Other Ambulatory Visit: Payer: Self-pay | Admitting: Internal Medicine

## 2017-05-13 NOTE — Progress Notes (Signed)
   THERAPIST PROGRESS NOTE   Session Time:1:10-2:00  Participation Level:Active  Behavioral Response:Well GroomedAlertEuthymic  Type of Therapy:Individual Therapy  Treatment Goals addressed:Coping  Interventions:CBT  Summary:Aristea R Dixonis a 57 y.o.femalewho presents with anxiety, depression.   Suicidal/Homicidal:Nowithout intent/plan  Therapist Response:Pt. Continues topresent with bright mood, calm. Pt. Reports that she had a good Christmas that was spent with her mother. Pt. Reports that she was somewhat disappointed that her mother did not spend the night with her as planned and wanted to go back to the assisted living center. Pt. Discussed her disappointment with the assisted living center and would like to find another placement for her due to poor maintenance, but process has been difficult due to medicaid. Pt. Discussed her family history on mother's and father's side of mental illness and being bullied as a child because of her mother's mental illness. Pt. Discussed her her emotional struggle growing up with a parent with a mental illness and having to defend herself from others who did not understand what she had to endure. Pt. Discussed that she continues to struggle as an adult to have relationships with adults who hurt her as a child. Pt. Discussed current struggle of trying to find a job and discussed her preparations for another job interview this week.  Plan: Return again in2weeks.  Diagnosis:Anxiety Disorder    Nancie Neas, [facility] 05/13/2017

## 2017-05-14 ENCOUNTER — Ambulatory Visit (INDEPENDENT_AMBULATORY_CARE_PROVIDER_SITE_OTHER): Payer: 59 | Admitting: Psychiatry

## 2017-05-14 ENCOUNTER — Encounter (HOSPITAL_COMMUNITY): Payer: Self-pay | Admitting: Psychiatry

## 2017-05-14 VITALS — BP 130/76 | HR 80 | Ht 66.0 in | Wt 220.0 lb

## 2017-05-14 DIAGNOSIS — F411 Generalized anxiety disorder: Secondary | ICD-10-CM | POA: Diagnosis not present

## 2017-05-14 DIAGNOSIS — Z813 Family history of other psychoactive substance abuse and dependence: Secondary | ICD-10-CM | POA: Diagnosis not present

## 2017-05-14 DIAGNOSIS — Z818 Family history of other mental and behavioral disorders: Secondary | ICD-10-CM

## 2017-05-14 DIAGNOSIS — F332 Major depressive disorder, recurrent severe without psychotic features: Secondary | ICD-10-CM

## 2017-05-14 DIAGNOSIS — F410 Panic disorder [episodic paroxysmal anxiety] without agoraphobia: Secondary | ICD-10-CM | POA: Diagnosis not present

## 2017-05-14 DIAGNOSIS — Z560 Unemployment, unspecified: Secondary | ICD-10-CM | POA: Diagnosis not present

## 2017-05-14 MED ORDER — HYDROXYZINE HCL 10 MG PO TABS: ORAL_TABLET | ORAL | 1 refills | Status: DC

## 2017-05-14 MED ORDER — BUPROPION HCL ER (XL) 150 MG PO TB24: 150.0000 mg | ORAL_TABLET | Freq: Every day | ORAL | 0 refills | Status: DC

## 2017-05-14 NOTE — Progress Notes (Signed)
[facility] MD/PA/NP OP Progress Note  05/14/2017 8:55 AM Ariana Cohen  MRN:  [id number]  Chief Complaint: I like Wellbutrin but it is causing the itch.  HPI: Ariana Cohen came for her follow-up appointment.  She is not taking Wellbutrin XL 150.  Dose reduced because of itching.  She really like the Wellbutrin because it is giving her focus, attention, and energy and she is able to do multitasking but she is having itching on her face.  There were no breakouts of the skin.  Patient also have seasonal allergies and she is taking Claritin and Allegra.  She is still not able to find a job but she has a job interview next week.  She is seeing Eloise Levels for counseling.  She admitted her anxiety and panic attacks are less since taking the Wellbutrin.  She has no tremors, shakes or any other side effects.  He is sleeping good.  She recently seen her physician and she is pleased that she does not have to see a cardiologist for another year.  Patient denies any anger, mania, psychosis, hallucination, crying spells or any suicidal thoughts.  Currently she is on disability.  Her energy level is good.  She prefer to continue Wellbutrin however wondering if something else can be done to help with itching.  She also lost weight since last visit.  She is no longer taking BuSpar because it is making her sleepy.  Visit Diagnosis:    ICD-10-CM   1. GAD (generalized anxiety disorder) F41.1 buPROPion (WELLBUTRIN XL) 150 MG 24 hr tablet    hydrOXYzine (ATARAX/VISTARIL) 10 MG tablet    Past Psychiatric History: Reviewed. Patient has a history of depression.  She took Zoloft, Klonopin, Paxil, lorazepam in the past. She reported Klonopin makes her very sleepy, Ativan and BuSpar did not help her. Patient denies any history of suicidal attempt or any inpatient psychiatric treatment, mania, psychosis or any hallucination.  Past Medical History:  Past Medical History:  Diagnosis Date  . Anxiety   . Depression   . Fibroids   .  Hypertension   . Urticaria     Past Surgical History:  Procedure Laterality Date  . [place]; 2000  . MYOMECTOMY  1992    Family Psychiatric History: Viewed.  Family History:  Family History  Problem Relation Age of Onset  . Hypertension Mother   . Renal Disease Mother   . Schizophrenia Mother   . Hypertension Father   . Heart disease Father        also OSA  . Hypertension Maternal Grandmother   . Heart attack Maternal Grandmother   . Thyroid disease Maternal Grandmother        thyroidectomy  . Hypertension Maternal Grandfather   . Multiple myeloma Maternal Grandfather   . Sleep apnea Maternal Grandfather   . Hypertension Sister   . Drug abuse Maternal Aunt     Social History:  Social History   Socioeconomic History  . Marital status: Legally Separated    Spouse name: None  . Number of children: None  . Years of education: None  . Highest education level: None  Social Needs  . Financial resource strain: None  . Food insecurity - worry: None  . Food insecurity - inability: None  . Transportation needs - medical: None  . Transportation needs - non-medical: None  Occupational History    Comment: works Government social research officer  Tobacco Use  . Smoking status: Never Smoker  . Smokeless tobacco: Never Used  Substance and Sexual Activity  . Alcohol use: No  . Drug use: No  . Sexual activity: No  Other Topics Concern  . None  Social History Narrative   epworth sleepiness scale = 18 (08/11/15)    Allergies:  Allergies  Allergen Reactions  . Norvasc [Amlodipine Besylate] Swelling  . Aspirin Hives  . Metoprolol Other (See Comments)    Fatigue, somnolence   . Minoxidil Other (See Comments)    Weight gain, extreme edema  Legs, feet, hands, lethargic      Metabolic Disorder Labs: No results found for: HGBA1C, MPG No results found for: PROLACTIN No results found for: CHOL, TRIG, HDL, CHOLHDL, VLDL, LDLCALC Lab Results  Component Value Date   TSH 0.64  10/31/2016   TSH 1.13 10/02/2016    Therapeutic Level Labs: No results found for: LITHIUM No results found for: VALPROATE No components found for:  CBMZ  Current Medications: Current Outpatient Medications  Medication Sig Dispense Refill  . buPROPion (WELLBUTRIN XL) 150 MG 24 hr tablet Take 1 tablet by mouth daily.  0  . BYSTOLIC 10 MG tablet TAKE 1 TABLET (10 MG TOTAL) BY MOUTH DAILY. 90 tablet 3  . cetirizine (ZYRTEC) 10 MG tablet Take 10 mg by mouth daily.    . hydrALAZINE (APRESOLINE) 100 MG tablet TAKE 1 TABLET (100 MG TOTAL) BY MOUTH 3 (THREE) TIMES DAILY.NEED APT FOR REFILLS 30 tablet 6  . ranitidine (ZANTAC) 150 MG tablet TAKE 1 TABLET BY MOUTH TWICE A DAY 60 tablet 0  . spironolactone (ALDACTONE) 25 MG tablet Take 25 mg by mouth daily.    Marland Kitchen telmisartan-hydrochlorothiazide (MICARDIS HCT) 80-25 MG tablet TAKE 1 TABLET BY MOUTH DAILY. 90 tablet 1  . montelukast (SINGULAIR) 10 MG tablet TAKE 1 TABLET BY MOUTH EVERYDAY AT BEDTIME (Patient not taking: Reported on 05/14/2017) 90 tablet 0   No current facility-administered medications for this visit.      Musculoskeletal: Strength & Muscle Tone: within normal limits Gait & Station: normal Patient leans: N/A  Psychiatric Specialty Exam: Review of Systems  Constitutional: Positive for weight loss.  Musculoskeletal: Negative.   Skin: Positive for itching. Negative for rash.  Neurological: Negative.     Blood pressure 130/76, pulse 80, height '5\' 6"'$  (1.676 m), weight 220 lb (99.8 kg), last menstrual period 06/20/2013.Body mass index is 35.51 kg/m.  General Appearance: Casual  Eye Contact:  Good  Speech:  Clear and Coherent  Volume:  Normal  Mood:  Euthymic  Affect:  Congruent  Thought Process:  Goal Directed  Orientation:  Full (Time, Place, and Person)  Thought Content: Logical   Suicidal Thoughts:  No  Homicidal Thoughts:  No  Memory:  Immediate;   Good Recent;   Good Remote;   Good  Judgement:  Good  Insight:  Good   Psychomotor Activity:  Normal  Concentration:  Concentration: Good and Attention Span: Good  Recall:  Good  Fund of Knowledge: Good  Language: Good  Akathisia:  No  Handed:  Right  AIMS (if indicated): not done  Assets:  Communication Skills Desire for Improvement Housing Resilience Social Support  ADL's:  Intact  Cognition: WNL  Sleep:  Good   Screenings: Mini-Mental     Office Visit from 10/01/2016 in [facility]  Total Score (max 30 points )  27    PHQ2-9     Office Visit from 12/30/2016 in Primary [facility] at [place] from 08/26/2016 in Primary [facility] at [place] from 08/05/2016 in  Primary Care at [place] from 07/10/2016 in Primary [facility] at [place] from 07/01/2016 in [place] [facility] Total Score  0  '6  4  5  '$ 0  PHQ-9 Total Score  No data  '23  18  22  '$ No data       Assessment and Plan: Major depressive disorder, recurrent.  Panic attacks.  Generalized anxiety disorder.  Patient doing better on Wellbutrin XL 150.  Dose reduced due to itching.  She does not want to change her medication.  She is no longer taking BuSpar.  I recommended to try Vistaril 10 mg 1-2 tablet to help the itching and anxiety.  I also suggested that she may need to stop seasonal allergy medication as Vistaril may help her seasonal allergies.  Encouraged to continue counseling with Eloise Levels.  She is happy she lost weight with the medication.  Recommended to call us back if she has any question, concern if she feels worsening of the symptoms.  Follow-up in 2 months.   Kathlee Nations, MD 05/14/2017, 8:55 AM

## 2017-05-28 ENCOUNTER — Telehealth: Payer: Self-pay | Admitting: *Deleted

## 2017-05-28 MED ORDER — MODAFINIL 200 MG PO TABS: 200.0000 mg | ORAL_TABLET | Freq: Every day | ORAL | 3 refills | Status: DC

## 2017-05-28 NOTE — Telephone Encounter (Signed)
Per Dr. Claiborne Billings ok to try Provigil 200 mg daily.     Spoke to patient who agreed to try new medication.  Medication called into pharmacy, patient will call back if needed.

## 2017-05-28 NOTE — Telephone Encounter (Signed)
-----   Message from Pixie Casino, MD sent at 05/07/2017  4:53 PM EST ----- Regarding: Stimulant? Gershon Mussel-  I saw Ms. Guadamuz today - she took one dose of Nuvigil and had facial tingling. Still fatigued - is it worthwhile trying Provigil? If you think so can you reach out to her and prescribe?  Thanks.  -Mali

## 2017-05-30 ENCOUNTER — Telehealth: Payer: Self-pay | Admitting: Internal Medicine

## 2017-05-30 MED ORDER — NEBIVOLOL HCL 10 MG PO TABS: 10.0000 mg | ORAL_TABLET | Freq: Every day | ORAL | 0 refills | Status: DC

## 2017-05-30 NOTE — Telephone Encounter (Signed)
New message     Patient calling the office for samples of medication:   1.  What medication and dosage are you requesting samples for?BYSTOLIC 10 MG tablet  2.  Are you currently out of this medication? yes

## 2017-05-30 NOTE — Telephone Encounter (Signed)
Left message samples available for pick  up x3

## 2017-06-12 ENCOUNTER — Other Ambulatory Visit: Payer: Self-pay | Admitting: Cardiovascular Disease

## 2017-06-12 ENCOUNTER — Other Ambulatory Visit: Payer: Self-pay | Admitting: Internal Medicine

## 2017-06-12 NOTE — Telephone Encounter (Signed)
REFILL 

## 2017-06-30 ENCOUNTER — Telehealth: Payer: Self-pay | Admitting: Internal Medicine

## 2017-06-30 NOTE — Telephone Encounter (Signed)
Left a message informing the patient that samples were available at the front.  Medication Samples have been provided to the patient.  Drug name: Bystolic       Strength: 10 mg        Qty: 3 boxes  LOT: [id number]  Exp.Date: 4/20

## 2017-06-30 NOTE — Telephone Encounter (Signed)
New Message   Patient calling the office for samples of medication:   1.  What medication and dosage are you requesting samples for? nebivolol (BYSTOLIC) 10 MG tablet  2.  Are you currently out of this medication?Yes, patient took the last one today

## 2017-07-07 ENCOUNTER — Other Ambulatory Visit: Payer: Self-pay | Admitting: *Deleted

## 2017-07-07 DIAGNOSIS — N3941 Urge incontinence: Secondary | ICD-10-CM | POA: Diagnosis not present

## 2017-07-07 DIAGNOSIS — N3281 Overactive bladder: Secondary | ICD-10-CM | POA: Diagnosis not present

## 2017-07-07 MED ORDER — HYDRALAZINE HCL 100 MG PO TABS: ORAL_TABLET | ORAL | 2 refills | Status: DC

## 2017-07-11 ENCOUNTER — Other Ambulatory Visit (HOSPITAL_COMMUNITY): Payer: Self-pay | Admitting: Psychiatry

## 2017-07-11 DIAGNOSIS — F411 Generalized anxiety disorder: Secondary | ICD-10-CM

## 2017-07-14 ENCOUNTER — Ambulatory Visit (HOSPITAL_COMMUNITY): Payer: Self-pay | Admitting: Psychiatry

## 2017-08-25 ENCOUNTER — Ambulatory Visit: Payer: BLUE CROSS/BLUE SHIELD | Admitting: Physician Assistant

## 2017-08-25 ENCOUNTER — Encounter: Payer: Self-pay | Admitting: Physician Assistant

## 2017-08-25 ENCOUNTER — Other Ambulatory Visit: Payer: Self-pay

## 2017-08-25 VITALS — BP 98/61 | HR 70 | Temp 98.0°F | Resp 16 | Ht 66.5 in | Wt 224.8 lb

## 2017-08-25 DIAGNOSIS — Z1329 Encounter for screening for other suspected endocrine disorder: Secondary | ICD-10-CM

## 2017-08-25 DIAGNOSIS — M109 Gout, unspecified: Secondary | ICD-10-CM | POA: Diagnosis not present

## 2017-08-25 DIAGNOSIS — M79675 Pain in left toe(s): Secondary | ICD-10-CM | POA: Diagnosis not present

## 2017-08-25 LAB — GLUCOSE, POCT (MANUAL RESULT ENTRY): POC Glucose: 130 mg/dl — AB (ref 70–99)

## 2017-08-25 MED ORDER — PREDNISONE 10 MG PO TABS: ORAL_TABLET | ORAL | 0 refills | Status: DC

## 2017-08-25 NOTE — Patient Instructions (Addendum)
I am treating you today for gout (see below for information and home care tips).  Start taking prednisone 40 mg once daily for 3 days, and then take 30 mg x 3 days, then 20mg  x 3 days, then 10mg  x 3 days. Come back if you are not improving. Wear supportive shoes. Avoid applying heat    Gout Gout is painful swelling that can occur in some of your joints. Gout is a type of arthritis. This condition is caused by having too much uric acid in your body. Uric acid is a chemical that forms when your body breaks down substances called purines. Purines are important for building body proteins. When your body has too much uric acid, sharp crystals can form and build up inside your joints. This causes pain and swelling. Gout attacks can happen quickly and be very painful (acute gout). Over time, the attacks can affect more joints and become more frequent (chronic gout). Gout can also cause uric acid to build up under your skin and inside your kidneys. What are the causes? This condition is caused by too much uric acid in your blood. This can occur because:  Your kidneys do not remove enough uric acid from your blood. This is the most common cause.  Your body makes too much uric acid. This can occur with some cancers and cancer treatments. It can also occur if your body is breaking down too many red blood cells (hemolytic anemia).  You eat too many foods that are high in purines. These foods include organ meats and some seafood. Alcohol, especially beer, is also high in purines.  A gout attack may be triggered by trauma or stress. What increases the risk? This condition is more likely to develop in people who:  Have a family history of gout.  Are female and middle-aged.  Are female and have gone through menopause.  Are obese.  Frequently drink alcohol, especially beer.  Are dehydrated.  Lose weight too quickly.  Have an organ transplant.  Have lead poisoning.  Take certain medicines,  including aspirin, cyclosporine, diuretics, levodopa, and niacin.  Have kidney disease or psoriasis.  What are the signs or symptoms? An attack of acute gout happens quickly. It usually occurs in just one joint. The most common place is the big toe. Attacks often start at night. Other joints that may be affected include joints of the feet, ankle, knee, fingers, wrist, or elbow. Symptoms may include:  Severe pain.  Warmth.  Swelling.  Stiffness.  Tenderness. The affected joint may be very painful to touch.  Shiny, red, or purple skin.  Chills and fever.  Chronic gout may cause symptoms more frequently. More joints may be involved. You may also have white or yellow lumps (tophi) on your hands or feet or in other areas near your joints. How is this diagnosed? This condition is diagnosed based on your symptoms, medical history, and physical exam. You may have tests, such as:  Blood tests to measure uric acid levels.  Removal of joint fluid with a needle (aspiration) to look for uric acid crystals.  X-rays to look for joint damage.  How is this treated? Treatment for this condition has two phases: treating an acute attack and preventing future attacks. Acute gout treatment may include medicines to reduce pain and swelling, including:  NSAIDs.  Steroids. These are strong anti-inflammatory medicines that can be taken by mouth (orally) or injected into a joint.  Colchicine. This medicine relieves pain and swelling when it  is taken soon after an attack. It can be given orally or through an IV tube.  Preventive treatment may include:  Daily use of smaller doses of NSAIDs or colchicine.  Use of a medicine that reduces uric acid levels in your blood.  Changes to your diet. You may need to see a specialist about healthy eating (dietitian).  Follow these instructions at home: During a Gout Attack  If directed, apply ice to the affected area: ? Put ice in a plastic bag. ? Place  a towel between your skin and the bag. ? Leave the ice on for 20 minutes, 2-3 times a day.  Rest the joint as much as possible. If the affected joint is in your leg, you may be given crutches to use.  Raise (elevate) the affected joint above the level of your heart as often as possible.  Drink enough fluids to keep your urine clear or pale yellow.  Take over-the-counter and prescription medicines only as told by your health care provider.  Do not drive or operate heavy machinery while taking prescription pain medicine.  Follow instructions from your health care provider about eating or drinking restrictions.  Return to your normal activities as told by your health care provider. Ask your health care provider what activities are safe for you. Avoiding Future Gout Attacks  Follow a low-purine diet as told by your dietitian or health care provider. Avoid foods and drinks that are high in purines, including liver, kidney, anchovies, asparagus, herring, mushrooms, mussels, and beer.  Limit alcohol intake to no more than 1 drink a day for nonpregnant women and 2 drinks a day for men. One drink equals 12 oz of beer, 5 oz of wine, or 1 oz of hard liquor.  Maintain a healthy weight or lose weight if you are overweight. If you want to lose weight, talk with your health care provider. It is important that you do not lose weight too quickly.  Start or maintain an exercise program as told by your health care provider.  Drink enough fluids to keep your urine clear or pale yellow.  Take over-the-counter and prescription medicines only as told by your health care provider.  Keep all follow-up visits as told by your health care provider. This is important. Contact a health care provider if:  You have another gout attack.  You continue to have symptoms of a gout attack after10 days of treatment.  You have side effects from your medicines.  You have chills or a fever.  You have burning pain  when you urinate.  You have pain in your lower back or belly. Get help right away if:  You have severe or uncontrolled pain.  You cannot urinate. This information is not intended to replace advice given to you by your health care provider. Make sure you discuss any questions you have with your health care provider. Document Released: 04/19/2000 Document Revised: 09/28/2015 Document Reviewed: 02/02/2015 Elsevier Interactive Patient Education  2018 [employer].  IF you received an x-ray today, you will receive an invoice from [facility]. Please contact [facility] at [phone] with questions or concerns regarding your invoice.   IF you received labwork today, you will receive an invoice from [facility]. Please contact [facility] at [phone] with questions or concerns regarding your invoice.   Our billing staff will not be able to assist you with questions regarding bills from these companies.  You will be contacted with the lab results as soon as they are available.  The fastest way to get your results is to activate your My Chart account. Instructions are located on the last page of this paperwork. If you have not heard from [place] regarding the results in 2 weeks, please contact this office.

## 2017-08-25 NOTE — Progress Notes (Signed)
Ariana Cohen  MRN: [id number] DOB: 1960/08/10  PCP: Horald Pollen, MD  Subjective:  Pt is a 57 year old female PMH HTN, GAD, obesity who presents to clinic for toe pain. Pain is of the left great toe x 3-4 weeks. She was walking fast in flip flops, the next day it "had a cramp" and it has hurt since that time. Pain is on and off. Overall her pain is not improving and not worsening.Pain is worse when she bears weight.  Pain is 5/10, last night pain woke her up. Sheets hurt when touches her toe.  She cannot recall if there was warmth to the area.  She has soaked in hot water and epsom salt - this made it worse. She has taken ibuprofen x 2, which helped.  No h/o gout.  She does not drink beer.  No recent increase in red meat.  No h/o DM She is not fasting.   Review of Systems  Musculoskeletal: Positive for arthralgias and gait problem.  Neurological: Negative for weakness and numbness.    Patient Active Problem List   Diagnosis Date Noted  . Transient alteration of awareness 10/01/2016  . Memory loss 10/01/2016  . Obesity (BMI 30.0-34.9) 08/12/2016  . Onychomycosis 08/12/2016  . Acute reaction to situational stress 07/01/2016  . GAD (generalized anxiety disorder) 06/26/2016  . OSA (obstructive sleep apnea) 06/12/2016  . Snoring 06/11/2016  . Other fatigue 03/26/2016  . Medication management 11/06/2015  . Resistant hypertension 11/06/2015  . Excessive daytime sleepiness 11/06/2015  . Murmur 08/11/2015  . PVCs (premature ventricular contractions) 08/11/2015  . Shortness of breath 08/11/2015  . Essential hypertension 08/11/2015    Current Outpatient Medications on File Prior to Visit  Medication Sig Dispense Refill  . cetirizine (ZYRTEC) 10 MG tablet Take 10 mg by mouth daily.    . hydrALAZINE (APRESOLINE) 100 MG tablet TAKE 1 TABLET (100 MG TOTAL) BY MOUTH 3 (THREE) TIMES DAILY. 270 tablet 2  . nebivolol (BYSTOLIC) 10 MG tablet Take 1 tablet (10 mg total) by mouth  daily. 21 tablet 0  . spironolactone (ALDACTONE) 25 MG tablet Take 25 mg by mouth daily.    Marland Kitchen spironolactone (ALDACTONE) 25 MG tablet TAKE 1 TABLET BY MOUTH EVERY DAY 90 tablet 2  . telmisartan-hydrochlorothiazide (MICARDIS HCT) 80-25 MG tablet TAKE 1 TABLET BY MOUTH DAILY. 90 tablet 3  . buPROPion (WELLBUTRIN XL) 150 MG 24 hr tablet Take 1 tablet (150 mg total) by mouth daily. (Patient not taking: Reported on 08/25/2017) 90 tablet 0  . hydrOXYzine (ATARAX/VISTARIL) 10 MG tablet Take 1-2 tab at bed time (Patient not taking: Reported on 08/25/2017) 60 tablet 1  . modafinil (PROVIGIL) 200 MG tablet Take 1 tablet (200 mg total) by mouth daily. (Patient not taking: Reported on 08/25/2017) 30 tablet 3  . montelukast (SINGULAIR) 10 MG tablet TAKE 1 TABLET BY MOUTH EVERYDAY AT BEDTIME (Patient not taking: Reported on 05/14/2017) 90 tablet 0  . ranitidine (ZANTAC) 150 MG tablet TAKE 1 TABLET BY MOUTH TWICE A DAY (Patient not taking: Reported on 08/25/2017) 60 tablet 0   No current facility-administered medications on file prior to visit.     Allergies  Allergen Reactions  . Norvasc [Amlodipine Besylate] Swelling  . Aspirin Hives  . Metoprolol Other (See Comments)    Fatigue, somnolence   . Minoxidil Other (See Comments)    Weight gain, extreme edema  Legs, feet, hands, lethargic       Objective:  BP 98/61 (BP  Location: Right Arm, Cuff Size: Normal)   Pulse 70   Temp 98 F (36.7 C) (Oral)   Resp 16   Ht 5' 6.5" (1.689 m)   Wt 224 lb 12.8 oz (102 kg)   LMP 06/20/2013   SpO2 97%   BMI 35.74 kg/m   Physical Exam  Constitutional: She is oriented to person, place, and time. No distress.  Musculoskeletal:       Left foot: There is tenderness. There is normal range of motion, normal capillary refill, no crepitus, no deformity and no laceration.       Feet:  Neurological: She is alert and oriented to person, place, and time.  Skin: Skin is warm and dry.  Psychiatric: Judgment normal.  Vitals  reviewed.  Results for orders placed or performed in visit on 08/25/17  POCT glucose (manual entry)  Result Value Ref Range   POC Glucose 130 (A) 70 - 99 mg/dl    Assessment and Plan :  1. Gout involving toe of left foot, unspecified cause, unspecified chronicity 2. Pain of toe of left foot 3. Screening for endocrine disorder - POCT glucose (manual entry) - predniSONE (DELTASONE) 10 MG tablet; Take 40mg  x 3 days, 30mg  x 3 days, 20mg  x 3 days, 10mg  x 3 days.  Dispense: 30 tablet; Refill: 0 - pt presents with left great toe pain x 3 weeks. PE is suspicious for gout, suspect attack 2/2 to trauma. Plan to treat with Prednisone taper. RTC if no improvement or worsening symptoms. Consider imagine.   Mercer Pod, PA-C  [facility] 08/25/2017 2:27 PM

## 2017-09-03 IMAGING — NM NM MISC PROCEDURE
6 series · 36 of 36 positions shown · non-contrast
Comparison: none

[Series 1: wbr rest · 6.40mm/px · 6 of 64 frames shown]
[frame 6/64]
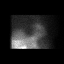
[frame 16/64]
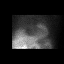
[frame 27/64]
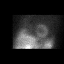
[frame 38/64]
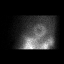
[frame 48/64]
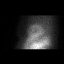
[frame 59/64]
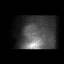

[Series 1: wbr_r-proj_st wbr rest · 6.40mm/px · 6 of 64 frames shown]
[frame 6/64]
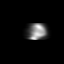
[frame 16/64]
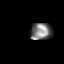
[frame 27/64]
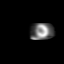
[frame 38/64]
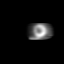
[frame 48/64]
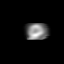
[frame 59/64]
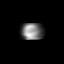

[Series 2: wbr stress-gsp · 6.40mm/px · 6 of 512 frames shown]
[frame 43/512]
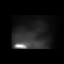
[frame 128/512]
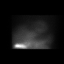
[frame 214/512]
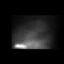
[frame 299/512]
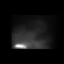
[frame 384/512]
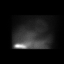
[frame 470/512]
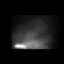

[Series 2: wbr_s-proj_st wbr stress-gsp · 6.40mm/px · 6 of 512 frames shown]
[frame 43/512]
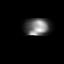
[frame 128/512]
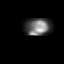
[frame 214/512]
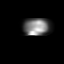
[frame 299/512]
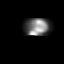
[frame 384/512]
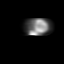
[frame 470/512]
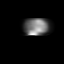

[Series 3: wbr_s-proj_st wbr stress-sum-em · 6.40mm/px · 6 of 64 frames shown]
[frame 6/64]
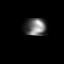
[frame 16/64]
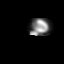
[frame 27/64]
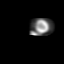
[frame 38/64]
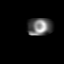
[frame 48/64]
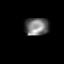
[frame 59/64]
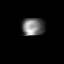

[Series 3: wbr stress-sum-em · 6.40mm/px · 6 of 64 frames shown]
[frame 6/64]
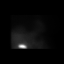
[frame 16/64]
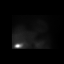
[frame 27/64]
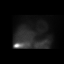
[frame 38/64]
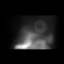
[frame 48/64]
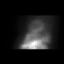
[frame 59/64]
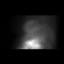

[36 of 36 positions shown; findings below may reference images not displayed]

Canned report from images found in remote index.

Refer to host system for actual result text.

## 2017-09-10 DIAGNOSIS — N3281 Overactive bladder: Secondary | ICD-10-CM | POA: Diagnosis not present

## 2017-09-10 DIAGNOSIS — N3941 Urge incontinence: Secondary | ICD-10-CM | POA: Diagnosis not present

## 2017-09-20 ENCOUNTER — Other Ambulatory Visit: Payer: Self-pay

## 2017-09-20 ENCOUNTER — Encounter (HOSPITAL_COMMUNITY): Payer: Self-pay

## 2017-09-20 ENCOUNTER — Emergency Department (HOSPITAL_COMMUNITY)
Admission: EM | Admit: 2017-09-20 | Discharge: 2017-09-20 | Disposition: A | Discharge status: Home / Self Care | Payer: BLUE CROSS/BLUE SHIELD | Attending: Emergency Medicine | Admitting: Emergency Medicine

## 2017-09-20 DIAGNOSIS — I959 Hypotension, unspecified: Secondary | ICD-10-CM | POA: Insufficient documentation

## 2017-09-20 DIAGNOSIS — R531 Weakness: Secondary | ICD-10-CM | POA: Diagnosis not present

## 2017-09-20 DIAGNOSIS — R404 Transient alteration of awareness: Secondary | ICD-10-CM | POA: Diagnosis not present

## 2017-09-20 DIAGNOSIS — Z79899 Other long term (current) drug therapy: Secondary | ICD-10-CM | POA: Diagnosis not present

## 2017-09-20 DIAGNOSIS — R55 Syncope and collapse: Secondary | ICD-10-CM | POA: Insufficient documentation

## 2017-09-20 LAB — HEMOGLOBIN A1C
Hgb A1c MFr Bld: 6.1 % — ABNORMAL HIGH (ref 4.8–5.6)
Mean Plasma Glucose: 128.37 mg/dL

## 2017-09-20 LAB — CBC WITH DIFFERENTIAL/PLATELET
Abs Immature Granulocytes: 0.1 10*3/uL (ref 0.0–0.1)
Basophils Absolute: 0.1 10*3/uL (ref 0.0–0.1)
Basophils Relative: 1 %
Eosinophils Absolute: 0.1 10*3/uL (ref 0.0–0.7)
Eosinophils Relative: 1 %
HCT: 43 % (ref 36.0–46.0)
Hemoglobin: 13.5 g/dL (ref 12.0–15.0)
Immature Granulocytes: 1 %
Lymphocytes Relative: 11 %
Lymphs Abs: 1.2 10*3/uL (ref 0.7–4.0)
MCH: 26.5 pg (ref 26.0–34.0)
MCHC: 31.4 g/dL (ref 30.0–36.0)
MCV: 84.3 fL (ref 78.0–100.0)
Monocytes Absolute: 0.6 10*3/uL (ref 0.1–1.0)
Monocytes Relative: 6 %
Neutro Abs: 8.5 10*3/uL — ABNORMAL HIGH (ref 1.7–7.7)
Neutrophils Relative %: 80 %
Platelets: 358 10*3/uL (ref 150–400)
RBC: 5.1 MIL/uL (ref 3.87–5.11)
RDW: 14.9 % (ref 11.5–15.5)
WBC: 10.5 10*3/uL (ref 4.0–10.5)

## 2017-09-20 LAB — URINALYSIS, ROUTINE W REFLEX MICROSCOPIC
Bilirubin Urine: NEGATIVE
Glucose, UA: NEGATIVE mg/dL
Hgb urine dipstick: NEGATIVE
Ketones, ur: NEGATIVE mg/dL
Leukocytes, UA: NEGATIVE
Nitrite: NEGATIVE
Protein, ur: NEGATIVE mg/dL
Specific Gravity, Urine: 1.012 (ref 1.005–1.030)
pH: 6 (ref 5.0–8.0)

## 2017-09-20 LAB — BASIC METABOLIC PANEL
Anion gap: 11 (ref 5–15)
BUN: 12 mg/dL (ref 6–20)
CO2: 25 mmol/L (ref 22–32)
Calcium: 8.8 mg/dL — ABNORMAL LOW (ref 8.9–10.3)
Chloride: 103 mmol/L (ref 101–111)
Creatinine, Ser: 0.88 mg/dL (ref 0.44–1.00)
GFR calc Af Amer: >60 mL/min (ref >60)
GFR calc non Af Amer: >60 mL/min (ref >60)
Glucose, Bld: 105 mg/dL — ABNORMAL HIGH (ref 65–99)
Potassium: 3.8 mmol/L (ref 3.5–5.1)
Sodium: 139 mmol/L (ref 135–145)

## 2017-09-20 LAB — I-STAT TROPONIN, ED: Troponin i, poc: 0 ng/mL (ref 0.00–0.08)

## 2017-09-20 MED ORDER — SODIUM CHLORIDE 0.9 % IV BOLUS
1000.0000 mL | Freq: Once | INTRAVENOUS | Status: AC
  Administered 2017-09-20: 1000 mL via INTRAVENOUS

## 2017-09-20 NOTE — ED Notes (Signed)
Patient verbalizes understanding of discharge instructions. Opportunity for questioning and answers were provided. Armband removed by staff, pt discharged from ED.  

## 2017-09-20 NOTE — Discharge Instructions (Addendum)
Your evaluated in the emergency department for feeling like you are going to pass out and having a low pressure.  We did blood work and did not find an obvious cause of your symptoms.  It may be related to blood pressure medications and you should decrease the dose of your blood pressure meds by half and call your cardiologist on Monday for follow-up.  Please return if any worsening symptoms.

## 2017-09-20 NOTE — ED Provider Notes (Signed)
[facility] EMERGENCY DEPARTMENT Provider Note   CSN: [id number] Arrival date & time: 09/20/17  1132     History   Chief Complaint Chief Complaint  Patient presents with  . Hypotension    HPI Ariana Cohen is a 57 y.o. female.  She is presenting today with acute onset of generalized weakness.  She states she got up took her blood pressure medicines had not eaten was sitting at the computer when she felt her vision getting blurry and dizzy like she was going to pass out.  She called family on 29 and they transported her here.  She states her symptoms are much improved.  She denies any prior history of the same but she thinks that her blood pressure medicine is been too much.  She was in her PCPs office a month ago when they noted a low blood pressure and referred her back to her cardiologist to have her meds adjusted.  She states she was at her PCPs for some toe pain and they gave her a course of prednisone for possible gout.  She is been off the prednisone for about a week.  The history is provided by the patient and a relative.  Near Syncope  This is a new problem. The current episode started 1 to 2 hours ago. The problem has been resolved. Pertinent negatives include no chest pain, no abdominal pain, no headaches and no shortness of breath. Nothing aggravates the symptoms. Nothing relieves the symptoms. She has tried nothing for the symptoms. The treatment provided significant relief.    Past Medical History:  Diagnosis Date  . Anxiety   . Depression   . Fibroids   . Hypertension   . Urticaria     Patient Active Problem List   Diagnosis Date Noted  . Gout involving toe of left foot 08/25/2017  . Transient alteration of awareness 10/01/2016  . Memory loss 10/01/2016  . Obesity (BMI 30.0-34.9) 08/12/2016  . Onychomycosis 08/12/2016  . Acute reaction to situational stress 07/01/2016  . GAD (generalized anxiety disorder) 06/26/2016  . OSA (obstructive  sleep apnea) 06/12/2016  . Snoring 06/11/2016  . Other fatigue 03/26/2016  . Medication management 11/06/2015  . Resistant hypertension 11/06/2015  . Excessive daytime sleepiness 11/06/2015  . Murmur 08/11/2015  . PVCs (premature ventricular contractions) 08/11/2015  . Shortness of breath 08/11/2015  . Essential hypertension 08/11/2015    Past Surgical History:  Procedure Laterality Date  . [place]; 2000  . MYOMECTOMY  1992     OB History   None      Home Medications    Prior to Admission medications   Medication Sig Start Date End Date Taking? Authorizing Provider  buPROPion (WELLBUTRIN XL) 150 MG 24 hr tablet Take 1 tablet (150 mg total) by mouth daily. Patient not taking: Reported on 08/25/2017 05/14/17   Arfeen, Arlyce Harman, MD  cetirizine (ZYRTEC) 10 MG tablet Take 10 mg by mouth daily.    [provider]  hydrALAZINE (APRESOLINE) 100 MG tablet TAKE 1 TABLET (100 MG TOTAL) BY MOUTH 3 (THREE) TIMES DAILY. 07/07/17   Hilty, Nadean Corwin, MD  hydrOXYzine (ATARAX/VISTARIL) 10 MG tablet Take 1-2 tab at bed time Patient not taking: Reported on 08/25/2017 05/14/17   Arfeen, Arlyce Harman, MD  modafinil (PROVIGIL) 200 MG tablet Take 1 tablet (200 mg total) by mouth daily. Patient not taking: Reported on 08/25/2017 05/28/17   Troy Sine, MD  montelukast (SINGULAIR) 10 MG tablet TAKE  1 TABLET BY MOUTH EVERYDAY AT BEDTIME Patient not taking: Reported on 05/14/2017 04/03/17   Kennith Gain, MD  nebivolol (BYSTOLIC) 10 MG tablet Take 1 tablet (10 mg total) by mouth daily. 05/30/17   Hilty, Nadean Corwin, MD  predniSONE (DELTASONE) 10 MG tablet Take '40mg'$  x 3 days, '30mg'$  x 3 days, '20mg'$  x 3 days, '10mg'$  x 3 days. 08/25/17   McVey, Gelene Mink, PA-C  ranitidine (ZANTAC) 150 MG tablet TAKE 1 TABLET BY MOUTH TWICE A DAY Patient not taking: Reported on 08/25/2017 04/02/17   Kennith Gain, MD  spironolactone (ALDACTONE) 25 MG tablet Take 25 mg by mouth daily.     [provider]  spironolactone (ALDACTONE) 25 MG tablet TAKE 1 TABLET BY MOUTH EVERY DAY 06/12/17   Troy Sine, MD  telmisartan-hydrochlorothiazide (MICARDIS HCT) 80-25 MG tablet TAKE 1 TABLET BY MOUTH DAILY. 06/12/17   Hilty, Nadean Corwin, MD    Family History Family History  Problem Relation Age of Onset  . Hypertension Mother   . Renal Disease Mother   . Schizophrenia Mother   . Hypertension Father   . Heart disease Father        also OSA  . Hypertension Maternal Grandmother   . Heart attack Maternal Grandmother   . Thyroid disease Maternal Grandmother        thyroidectomy  . Hypertension Maternal Grandfather   . Multiple myeloma Maternal Grandfather   . Sleep apnea Maternal Grandfather   . Hypertension Sister   . Drug abuse Maternal Aunt     Social History Social History   Tobacco Use  . Smoking status: Never Smoker  . Smokeless tobacco: Never Used  Substance Use Topics  . Alcohol use: No  . Drug use: No     Allergies   Norvasc [amlodipine besylate]; Aspirin; Metoprolol; and Minoxidil   Review of Systems Review of Systems  Constitutional: Negative for chills and fever.  HENT: Negative for ear pain and sore throat.   Eyes: Positive for visual disturbance. Negative for photophobia and pain.  Respiratory: Negative for shortness of breath.   Cardiovascular: Positive for near-syncope. Negative for chest pain.  Gastrointestinal: Negative for abdominal pain.  Genitourinary: Negative for dysuria and urgency.  Musculoskeletal: Negative for back pain and neck pain.  Skin: Negative for rash.  Neurological: Positive for dizziness and light-headedness. Negative for syncope, speech difficulty, numbness and headaches.     Physical Exam Updated Vital Signs BP 114/68   Pulse (!) 59   Temp 99.1 F (37.3 C) (Oral)   Resp 12   Ht 5' 6.5" (1.689 m)   Wt 101.2 kg (223 lb)   LMP 06/20/2013   SpO2 98%   BMI 35.45 kg/m   Physical Exam  Constitutional: She  appears well-developed and well-nourished. No distress.  HENT:  Head: Normocephalic and atraumatic.  Eyes: Conjunctivae are normal.  Neck: Neck supple.  Cardiovascular: Normal rate and regular rhythm.  No murmur heard. Pulmonary/Chest: Effort normal and breath sounds normal. No respiratory distress.  Abdominal: Soft. There is no tenderness.  Musculoskeletal: She exhibits no edema, tenderness or deformity.  Neurological: She is alert.  Skin: Skin is warm and dry.  Psychiatric: She has a normal mood and affect.  Nursing note and vitals reviewed.    ED Treatments / Results  Labs (all labs ordered are listed, but only abnormal results are displayed) Labs Reviewed  BASIC METABOLIC PANEL - Abnormal; Notable for the following components:      Result Value  Glucose, Bld 105 (*)    Calcium 8.8 (*)    All other components within normal limits  CBC WITH DIFFERENTIAL/PLATELET - Abnormal; Notable for the following components:   Neutro Abs 8.5 (*)    All other components within normal limits  HEMOGLOBIN A1C - Abnormal; Notable for the following components:   Hgb A1c MFr Bld 6.1 (*)    All other components within normal limits  URINALYSIS, ROUTINE W REFLEX MICROSCOPIC  I-STAT TROPONIN, ED    EKG EKG Interpretation  Date/Time:  Saturday Sep 20 2017 12:37:28 EDT Ventricular Rate:  63 PR Interval:    QRS Duration: 97 QT Interval:  432 QTC Calculation: 443 R Axis:   -7 Text Interpretation:  Sinus rhythm Abnormal T, consider ischemia, diffuse leads Baseline wander in lead(s) I II aVR no prior ecg to compare with Confirmed by Aletta Edouard [phone]) on 09/20/2017 2:41:28 PM   Radiology No results found.  Procedures Procedures (including critical care time)  Medications Ordered in ED Medications  sodium chloride 0.9 % bolus 1,000 mL (0 mLs Intravenous Stopped 09/20/17 1341)     Initial Impression / Assessment and Plan / ED Course  I have reviewed the triage vital signs and the  nursing notes.  Pertinent labs & imaging results that were available during my care of the patient were reviewed by me and considered in my medical decision making (see chart for details).  Clinical Course as of Sep 23 1042  Sat Sep 20, 3268  4812 57 year old female here with lightheadedness and presyncope.  Department notes today but she said she has felt increased fatigue for about a month which is worse on the weekends when she does not eat on time.  She is very hesitant to get any kind of work-up done here and just wants to go home but her family is convincing her to stay for some basic tests.  Her blood pressure was a little soft initially but is improving with some IV fluids.  Likely if we find nothing we would need to maybe decrease her antihypertensives and have her follow-up with her PCP   [MB]  1444 Since blood pressure has been improved here and she feels asymptomatic.  We will check some orthostatics and if she does look at that I feel she can be discharged and follow-up with her cardiologist to further discuss management of her blood pressure meds.   [MB]    Clinical Course User Index [MB] Hayden Rasmussen, MD    Patient here with dizziness lightheadedness and noted to have low blood pressure.  She has a history of hypertension and is on medications for this.  It is likely that she is on too much medicine but we should check lab work EKG troponins to make sure there is no infection that may be contributing to her hypotension.  I have ordered her liter of fluids and she can eat and drink.  She is hopeful to be discharged so she wants to go to work on Monday.  Final Clinical Impressions(s) / ED Diagnoses   Final diagnoses:  Near syncope  Hypotension, unspecified hypotension type    ED Discharge Orders    None       Hayden Rasmussen, MD 09/22/17 1044

## 2017-09-20 NOTE — ED Triage Notes (Signed)
Pt brought in by [facility] from home for lethargy and hypotension. Pt BP initially 88/60, after 500c NS 115/65. Per pt daughter- pt not "acting right". Pt having multiple episodes of "going in and out" per EMS. No vital sign changes during episodes. Per EMS it seems pt is falling asleep. Pt states she feels that she is on "too much medicine". Pt is A+Ox4.

## 2017-09-22 ENCOUNTER — Telehealth: Payer: Self-pay | Admitting: Internal Medicine

## 2017-09-22 NOTE — Telephone Encounter (Signed)
Patient called back to state that her blood pressure at 3:30 was 132/72. She stated that she is still tired and weak but she also said she did not get a lot of sleep last night. The patient has an appointment on 5/23 with Dr. Debara Pickett but would like to be seen sooner if possible.

## 2017-09-22 NOTE — Telephone Encounter (Signed)
New Message:       Pt is calling back to speak with RN

## 2017-09-22 NOTE — Telephone Encounter (Signed)
Left a message to call back.

## 2017-09-22 NOTE — Telephone Encounter (Signed)
NEW MESSAGE    Patient calling with concerns about low BP. Patient went to ED on 5/18

## 2017-09-22 NOTE — Telephone Encounter (Signed)
Patient called in stating that she went to the ED on Saturday 5/18 with low blood pressure and feelings of fatigue and weakness.   She stated that this morning she checked her blood pressure and it was 130/76 prior to taking her blood pressure medications. A few hours later after she had taken her medication it was 115/67. She stated that she did not feel tired or weak until after she had taken her medication. She stated that this is typically her blood pressure lately.  She did not take her spironolactone this morning. She stated that she was afraid her blood pressure would drop to low.   She currently takes Hydralazine 100 mg in the morning and evening. She takes 50 mg in the afternoon sometimes or she won't take it at all in the afternoon.  Bystolic 10 mg daily (takes as prescribed) Micardis (takes as prescribed)  Message routed to the provider for recommendations.

## 2017-09-22 NOTE — Telephone Encounter (Signed)
That BP is normal - safe to wait until 5/23. If she needs to be seen earlier, may see if APP is available.  Dr. Lemmie Evens

## 2017-09-23 NOTE — Telephone Encounter (Signed)
LMTCB

## 2017-09-23 NOTE — Telephone Encounter (Signed)
Patient returned call. She reports her BP was low when she went to ED on Saturday - she almost passed out. She did not take spironolactone this day either. She reports low BP for a while - was 98/61 about 1 month ago at urgent care per Epic.   BP today was 113/67 (after all BP meds except spironolactone)  She complains of lethargy  Advised will notify MD.   She is requesting a Friday 5/24 appt, which there are no openings. Advised her that if she is acutely concerned about BP, she should keep her 5/23 appt.

## 2017-09-25 ENCOUNTER — Encounter: Payer: Self-pay | Admitting: Family Medicine

## 2017-09-25 ENCOUNTER — Ambulatory Visit: Payer: BLUE CROSS/BLUE SHIELD | Admitting: Family Medicine

## 2017-09-25 ENCOUNTER — Other Ambulatory Visit: Payer: Self-pay

## 2017-09-25 ENCOUNTER — Encounter: Payer: Self-pay | Admitting: Internal Medicine

## 2017-09-25 ENCOUNTER — Ambulatory Visit (INDEPENDENT_AMBULATORY_CARE_PROVIDER_SITE_OTHER): Payer: BLUE CROSS/BLUE SHIELD

## 2017-09-25 ENCOUNTER — Ambulatory Visit: Payer: BLUE CROSS/BLUE SHIELD | Admitting: Internal Medicine

## 2017-09-25 VITALS — BP 106/64 | HR 66 | Ht 66.5 in | Wt 229.0 lb

## 2017-09-25 VITALS — BP 116/70 | HR 67 | Temp 98.0°F | Ht 66.5 in | Wt 225.8 lb

## 2017-09-25 DIAGNOSIS — R5383 Other fatigue: Secondary | ICD-10-CM | POA: Diagnosis not present

## 2017-09-25 DIAGNOSIS — R0989 Other specified symptoms and signs involving the circulatory and respiratory systems: Secondary | ICD-10-CM | POA: Diagnosis not present

## 2017-09-25 DIAGNOSIS — M79675 Pain in left toe(s): Secondary | ICD-10-CM | POA: Diagnosis not present

## 2017-09-25 DIAGNOSIS — R739 Hyperglycemia, unspecified: Secondary | ICD-10-CM | POA: Diagnosis not present

## 2017-09-25 DIAGNOSIS — G4733 Obstructive sleep apnea (adult) (pediatric): Secondary | ICD-10-CM | POA: Diagnosis not present

## 2017-09-25 DIAGNOSIS — M7989 Other specified soft tissue disorders: Secondary | ICD-10-CM | POA: Diagnosis not present

## 2017-09-25 MED ORDER — IBUPROFEN 800 MG PO TABS: 800.0000 mg | ORAL_TABLET | Freq: Three times a day (TID) | ORAL | 0 refills | Status: DC | PRN

## 2017-09-25 NOTE — Patient Instructions (Signed)
     IF you received an x-ray today, you will receive an invoice from [facility]. Please contact  Radiology at [phone] with questions or concerns regarding your invoice.   IF you received labwork today, you will receive an invoice from [facility]. Please contact [facility] at [phone] with questions or concerns regarding your invoice.   Our billing staff will not be able to assist you with questions regarding bills from these companies.  You will be contacted with the lab results as soon as they are available. The fastest way to get your results is to activate your My Chart account. Instructions are located on the last page of this paperwork. If you have not heard from us regarding the results in 2 weeks, please contact this office.     

## 2017-09-25 NOTE — Patient Instructions (Signed)
Your physician has recommended you make the following change in your medication:  -- TAKE spironolactone in the morning -- DECREASE hydralazine to 25mg  twice daily   Your physician recommends that you schedule a follow-up appointment in: [place]

## 2017-09-25 NOTE — Progress Notes (Signed)
OFFICE NOTE  Chief Complaint:  Follow-up ER visit  Primary Care Physician: Horald Pollen, MD  HPI:  Ariana Cohen is a 58 y.o. female with a history of hypertension, leg edema, hypokalemia (possibly related to Lasix) and excessive daytime sleepiness and fatigue. She's had a number of years it difficult to control hypertension. She's been followed by several primary care providers and recently saw Marijo File, who referred her to [place] for further evaluation. Over the years she's required a number of medications for blood pressure control. Recently she was on Norvasc which she said was helpful although had significant lower extremity swelling. Subsequently she had been put on clonidine which she said made her very sleepy in addition to her normal level of sleepiness. She is also on metoprolol and was placed on Diovan. She takes hydralazine as well. She was prescribed 50 mg 3 times a day however she only takes 100 mg daily. She works third shift and typically when she gets home sleeps for about 4 hours. She says she takes Lasix when she gets home and that often interrupts her sleep to get up to urinate. Recently she's required potassium based on laboratory work which showed some degree of hypokalemia. She generally takes the metoprolol also in the morning when she gets home from work before going to sleep as well as Diovan. She says that she does have difficulty sleeping when she tries to go to sleep. She is waking up several times either to urinate or for other reasons. Then she feels sleepy throughout the day. She oftentimes dozes off. She's noted to snore and denies any gasping or witnessed sleep apnea. Her sleepiness score today was quite high at 18. There is a strong family history of hypertension and her family both her mother and father and both grandparents. She also has a 69 year old sister with hypertension. She denies any chest pain but does get short of breath with exertion. Her EKG  today shows PVCs for which she says she is asymptomatic. There is also evidence of LVH and nonspecific T-wave changes. [facility] which showed normal LVEF of 55-60% and mild mitral regurgitation with no evidence of mitral valve prolapse. She does have a mitral murmur.  09/12/2015  Ariana Cohen returns today for follow-up. She reports continued hypertension despite changes in her medications. She is currently on hydralazine 50 mg twice a day, Toprol-XL 50 mg daily and valsartan 320 mg daily. Blood pressure today was 180/100. She's complaining of persistent swelling of her legs. She gets short of breath with activity. Her nuclear stress test was negative for ischemia however demonstrated a low EF of 45%. Echo however demonstrated normal systolic function therefore I suspect a gating abnormality. There was however stage II diastolic dysfunction and mild mitral regurgitation which is evidenced by her murmur. She does have a history of hypokalemia which could be related to Lasix or one must consider a more rare causes of hypertension such as hyperaldosteronism. There is however strong family history of hypertension. I also think she has significant fatigue and would benefit from a sleep study. This is scheduled on 10/09/2015.  11/06/2015  I saw Ariana Cohen back today in follow-up. Her blood pressure remains uncontrolled. She reports no improvement in her swelling with Lasix and to stop taking Lasix and potassium. Recently she's our pharmacist and hydralazine was increased to 100 mg twice a day. She reports strict compliance with medications although there is been  questions of this in the past. We talked about the possible dangers of me increasing her medicine if she was noncompliant and then all of a sudden started taking the medicine. That being said she insisted she was taking the medicine but blood pressure is not well controlled. Recently I did renin  and aldosterone testing which was negative.  03/26/2016  Ariana Cohen was seen back today in follow-up. Her blood pressure remains elevated. It's been very difficult to control. Despite increasing her hydralazine up to 100 mg 3 times a day, she is on max dose Bystolic and max dose Micardis HCT. She's been intolerant to amlodipine due to venous insufficiency and swelling. Previously she been on clonidine short acting pills but had significant fatigue. She likely has sleep apnea but has not yet had her sleep study. That is pending.  06/11/2016  Ariana Cohen returns today for follow-up. Her blood pressure continues to be difficult to manage. She is currently on Catapres TTS 0.2 mg patch with hydralazine 100 mg 3 times a day and Bystolic 10 mg daily. She also takes eye cart his HCTZ 80/25 mg daily. She did see [place] of pharmacist and her hypertension clinic who recommended substituting low-dose minoxidil to decrease her clonidine however this was significantly intolerant. She developed significant swelling and side effects related to the minoxidil discontinued it. Surprisingly, blood pressure is better controlled today in the office at 138/76, however this morning she noted it was 180/111. Not clear why there are some significant differences in her pressures. She has a very new blood pressure cuff with features to export it to her cell phone and I reviewed a number of blood pressure readings with her in the office. There does seem to be a trend toward morning blood pressure elevations suggesting that she may not be getting full coverage throughout the night.  08/12/2016  Ariana Cohen was seen today for follow-up. She recently had a sleep study which did not clearly show obstructive sleep apnea however Dr. Claiborne Billings wants her to undergo an MSLT test. He added Aldactone for additional blood pressure control which is possibly improved to 158/88 today. She says her numbers are lower at home but occasionally she forgets to  use her clonidine patch. That being said she says she is very compliant with her medicines. She continues to have some lower extremity edema which I think is partially related to venous insufficiency. She has improvement with compression stockings. She was concerned about discoloration in her toenails is a problem possibly related to poor circulation, however on exam it's more consistent with onychomycosis.  11/13/2016  Ariana Cohen returns today for follow-up. I rechecked her blood pressure is low today at 104 was 62. I rechecked it again manually at 100/50. She says she just recently took her hydralazine. She says that she discontinued her Catapres patch recently due to dry mouth and has considered decreasing some of her blood pressure medicines because of low blood pressure. I wonder if this is due to some labile hypertension. She was scheduled and completed a multiple sleep latency test. Those results are still pending. She is being treated for allergies and is on high-dose antihistamines.  12/12/2016  Ariana Cohen was seen today for follow-up. She continues to have low blood pressure which it is unclear if this is contributing to her fatigue. She does today she now gets some shortness of breath although it does not happen routinely and is not significantly worse than it was last year. I indicated that she  had had a stress test and an echo last year however she does not recall it. That test did show a fixed inferior defect with an EF of 45% on the nuclear stress test, thought to be scar or possibly artifact. Her echo however showed normal wall motion and no evidence for scar however she did have stage II diastolic dysfunction. It's possible that her degree of diastolic dysfunction could be associated with shortness of breath on exertion. She recently had a multiple sleep latency study which was abnormal suggesting hypersomnolence. She has a follow-up next week with Dr. Claiborne Billings who will hopefully discuss starting  stimulants with her.  05/07/2017  Ariana Cohen returns today for follow-up.  She continues to have complaints of fatigue.  She saw Dr. Claiborne Billings who started her on Nuvigil.  She said she developed facial itching after 1 dose of the medication and discontinued it.  It is not clear whether it would be helpful for her or not.  Blood pressure is well controlled today.  09/25/2017  Ariana Cohen was seen today in follow-up.  She was recently seen in the ER.  She has had problems with hypotension and near syncope.  Blood pressure has been low recently.  Her symptoms are most likely related to labile hypertension.  At times her blood pressures been very high and recently has been low.  She continues to struggle with fatigue but was intolerant of stimulants.  She is on a variety of blood pressure medications.  She is also concerned about ongoing lower extremity edema.  PMHx:  Past Medical History:  Diagnosis Date  . Anxiety   . Depression   . Fibroids   . Hypertension   . Urticaria     Past Surgical History:  Procedure Laterality Date  . [place]; 2000  . MYOMECTOMY  1992    FAMHx:  Family History  Problem Relation Age of Onset  . Hypertension Mother   . Renal Disease Mother   . Schizophrenia Mother   . Hypertension Father   . Heart disease Father        also OSA  . Hypertension Maternal Grandmother   . Heart attack Maternal Grandmother   . Thyroid disease Maternal Grandmother        thyroidectomy  . Hypertension Maternal Grandfather   . Multiple myeloma Maternal Grandfather   . Sleep apnea Maternal Grandfather   . Hypertension Sister   . Drug abuse Maternal Aunt     SOCHx:   reports that she has never smoked. She has never used smokeless tobacco. She reports that she does not drink alcohol or use drugs.  ALLERGIES:  Allergies  Allergen Reactions  . Norvasc [Amlodipine Besylate] Swelling  . Aspirin Hives  . Metoprolol Other (See Comments)    Fatigue, somnolence   .  Minoxidil Other (See Comments)    Weight gain, extreme edema  Legs, feet, hands, lethargic      ROS: Pertinent items noted in HPI and remainder of comprehensive ROS otherwise negative.  HOME MEDS: Current Outpatient Medications  Medication Sig Dispense Refill  . cetirizine (ZYRTEC) 10 MG tablet Take 10 mg by mouth daily.    . hydrALAZINE (APRESOLINE) 100 MG tablet Take 100 mg by mouth 2 (two) times daily.    Marland Kitchen ibuprofen (ADVIL,MOTRIN) 800 MG tablet Take 1 tablet (800 mg total) by mouth every 8 (eight) hours as needed. 30 tablet 0  . nebivolol (BYSTOLIC) 10 MG tablet Take 1 tablet (10 mg total) by mouth daily.  21 tablet 0  . spironolactone (ALDACTONE) 25 MG tablet Take 25 mg by mouth daily.    Marland Kitchen telmisartan-hydrochlorothiazide (MICARDIS HCT) 80-25 MG tablet TAKE 1 TABLET BY MOUTH DAILY. 90 tablet 3   No current facility-administered medications for this visit.     LABS/IMAGING: No results found for this or any previous visit (from the past 48 hour(s)). Dg Foot Complete Left  Result Date: 09/25/2017 CLINICAL DATA:  MTP joint for several weeks, no known injury, initial encounter EXAM: LEFT FOOT - COMPLETE 3+ VIEW COMPARISON:  None. FINDINGS: Diffuse soft tissue swelling is noted along the distal metatarsals. No acute fracture or dislocation is noted. No other focal abnormality is seen. IMPRESSION: Soft tissue swelling without acute bony abnormality. Electronically Signed   By: Inez Catalina M.D.   On: 09/25/2017 13:03    WEIGHTS: Wt Readings from Last 3 Encounters:  09/25/17 229 lb (103.9 kg)  09/25/17 225 lb 12.8 oz (102.4 kg)  09/20/17 223 lb (101.2 kg)    VITALS: BP 106/64   Pulse 66   Ht 5' 6.5" (1.689 m)   Wt 229 lb (103.9 kg)   LMP 06/20/2013   BMI 36.41 kg/m   EXAM: General appearance: alert and no distress Neck: no carotid bruit, no JVD and thyroid not enlarged, symmetric, no tenderness/mass/nodules Lungs: clear to auscultation bilaterally Heart: regular rate and  rhythm, S1, S2 normal, no murmur, click, rub or gallop Abdomen: soft, non-tender; bowel sounds normal; no masses,  no organomegaly Extremities: edema 1+ bilateral edema Pulses: 2+ and symmetric Skin: Skin color, texture, turgor normal. No rashes or lesions Neurologic: Grossly normal Psych: Appears fatigued  EKG: Deferred  ASSESSMENT: 1. Labile hypertension 2. Presyncope 3. Onchomycosis 4. DOE - normal LV systolic function with grade 2 diastolic dysfunction and mild LVH 5. Murmur 6. PVCs 7. Significant daytime sleepiness with concern for sleep apnea 8. Leg edema  PLAN: 1.   Ariana Cohen has had labile hypertension and recently has had hypotension and was presyncopal.  Blood pressure has been low in the 80s to 90s.  I would recommend a significant reduction in her blood pressure medicine.  We will decrease hydralazine to 50 mg twice daily.  She is advised to take her Aldactone in the morning.  She should monitor her blood pressure and contact us with any significant fluctuations.  Follow-up with me in 3 months.  Pixie Casino, MD, [facility], [place] Director of the [facility]  Card[facility] Attending Cardiologist  Direct Dial: [phone]  Fax: [phone]  Website:  [email or web address] 09/25/2017, 3:53 PM

## 2017-09-25 NOTE — Progress Notes (Signed)
5/23/201911:34 AM  Ariana Cohen November 14, 1960, 57 y.o. female [id number]  Chief Complaint  Patient presents with  . Back Pain    having pain in the right foot, big toe since April was told she may possibly have gout. Fininshe the medication, a wk later the pain did come bk  . Referral    Colonscopy needed    HPI:   Patient is a 57 y.o. female with past medical history significant for HTN who presents today for continued pain of left toe  She reports that pain started after running around on flip-flops, something she normally does not do almost 2 months ago Pain responds to naproxen, ice, brace and prednisone taper given, but never fully goes away Started getting bad again about a week ago, to the point that she feels she needs to use a cane She reports some swelling, no real warmth or redness She denies any h/o previous gout She has had some weight gain, but < 10 lbs  Saw Whitney McVey on 08/25/17 for LEFT big toe pain, treated with pred for presumptive gout Seen in ER for presyncope, found to be hypotensive, sees cards today Labs done in may a1c 6.1 and hypocalcemia, otherwise normal  Fall Risk  09/25/2017 08/25/2017 12/30/2016 10/01/2016 08/26/2016  Falls in the past year? _0      Depression screen [facility] 2/9 09/25/2017 08/25/2017 12/30/2016  Decreased Interest 0 0 0  Down, Depressed, Hopeless 0 0 0  PHQ - 2 Score 0 0 0  Altered sleeping - - -  Tired, decreased energy - - -  Change in appetite - - -  Feeling bad or failure about yourself  - - -  Trouble concentrating - - -  Moving slowly or fidgety/restless - - -  Suicidal thoughts - - -  PHQ-9 Score - - -  Difficult doing work/chores - - -    Allergies  Allergen Reactions  . Norvasc [Amlodipine Besylate] Swelling  . Aspirin Hives  . Metoprolol Other (See Comments)    Fatigue, somnolence   . Minoxidil Other (See Comments)    Weight gain, extreme edema  Legs, feet, hands, lethargic      Prior to Admission  medications   Medication Sig Start Date End Date Taking? Authorizing Provider  cetirizine (ZYRTEC) 10 MG tablet Take 10 mg by mouth daily.   Yes [provider]  hydrALAZINE (APRESOLINE) 100 MG tablet TAKE 1 TABLET (100 MG TOTAL) BY MOUTH 3 (THREE) TIMES DAILY. 07/07/17  Yes Hilty, Nadean Corwin, MD  nebivolol (BYSTOLIC) 10 MG tablet Take 1 tablet (10 mg total) by mouth daily. 05/30/17  Yes Hilty, Nadean Corwin, MD  spironolactone (ALDACTONE) 25 MG tablet Take 25 mg by mouth daily.   Yes [provider]  telmisartan-hydrochlorothiazide (MICARDIS HCT) 80-25 MG tablet TAKE 1 TABLET BY MOUTH DAILY. 06/12/17  Yes Hilty, Nadean Corwin, MD    Past Medical History:  Diagnosis Date  . Anxiety   . Depression   . Fibroids   . Hypertension   . Urticaria     Past Surgical History:  Procedure Laterality Date  . [place]; 2000  . MYOMECTOMY  1992    Social History   Tobacco Use  . Smoking status: Never Smoker  . Smokeless tobacco: Never Used  Substance Use Topics  . Alcohol use: No    Family History  Problem Relation Age of Onset  . Hypertension Mother   . Renal Disease Mother   .  Schizophrenia Mother   . Hypertension Father   . Heart disease Father        also OSA  . Hypertension Maternal Grandmother   . Heart attack Maternal Grandmother   . Thyroid disease Maternal Grandmother        thyroidectomy  . Hypertension Maternal Grandfather   . Multiple myeloma Maternal Grandfather   . Sleep apnea Maternal Grandfather   . Hypertension Sister   . Drug abuse Maternal Aunt     ROS Per hpi  OBJECTIVE:  Blood pressure 116/70, pulse 67, temperature 98 F (36.7 C), temperature source Oral, height 5' 6.5" (1.689 m), weight 225 lb 12.8 oz (102.4 kg), last menstrual period 06/20/2013, SpO2 97 %.  Wt Readings from Last 3 Encounters:  09/25/17 225 lb 12.8 oz (102.4 kg)  09/20/17 223 lb (101.2 kg)  08/25/17 224 lb 12.8 oz (102 kg)    Physical Exam  Constitutional:  She is oriented to person, place, and time.  HENT:  Head: Normocephalic and atraumatic.  Mouth/Throat: Mucous membranes are normal.  Eyes: Pupils are equal, round, and reactive to light. EOM are normal. No scleral icterus.  Neck: Neck supple.  Pulmonary/Chest: Effort normal.  Musculoskeletal: She exhibits edema (bilateral, pitting, +1, upto knees, + varicose veins).       Left foot: There is tenderness (at 1st MTP joint, swelling, no warmth or redness).  Neurological: She is alert and oriented to person, place, and time.  Skin: Skin is warm and dry.  Psychiatric: She has a normal mood and affect.  Nursing note and vitals reviewed.   Dg Foot Complete Left  Result Date: 09/25/2017 CLINICAL DATA:  MTP joint for several weeks, no known injury, initial encounter EXAM: LEFT FOOT - COMPLETE 3+ VIEW COMPARISON:  None. FINDINGS: Diffuse soft tissue swelling is noted along the distal metatarsals. No acute fracture or dislocation is noted. No other focal abnormality is seen. IMPRESSION: Soft tissue swelling without acute bony abnormality. Electronically Signed   By: Inez Catalina M.D.   On: 09/25/2017 13:03     ASSESSMENT and PLAN  1. Pain of toe of left foot Discussed RICE therapy. Changing NSAIDs, does not seem gout as no redness or warmth, xray negative for bony involvement. Wondering about sprain. If uric acid elevated will do trial of colchicine. Referring to sports medicine for further eval and treatment.  - DG Foot Complete Left; Future - ANA,IFA RA Diag Pnl w/rflx Tit/Patn; Future - Sedimentation Rate; Future - Rheumatoid factor; Future - Uric Acid; Future - Ambulatory referral to Sports Medicine  2. Hyperglycemia A1c 6.1, in setting of prednisone taper. Discussed low carb diet, exercise and weight loss. Recheck a1c in 6 months.  3. Hypocalcemia Albumin not checked, re-eval with ionized calcium today - Calcium, ionized; Future  Other orders - ibuprofen (ADVIL,MOTRIN) 800 MG tablet;  Take 1 tablet (800 mg total) by mouth every 8 (eight) hours as needed.  Return in about 6 months (around 03/28/2018) for a1c.    Rutherford Guys, MD Primary Care at [address] [place], [place] [address] Ph.  [phone] Fax [phone]

## 2017-09-25 NOTE — Addendum Note (Signed)
Addended by: Doran Clay on: 09/25/2017 01:42 PM   Modules accepted: Orders

## 2017-09-26 ENCOUNTER — Ambulatory Visit: Payer: BLUE CROSS/BLUE SHIELD | Admitting: Emergency Medicine

## 2017-09-26 MED ORDER — COLCHICINE 0.6 MG PO TABS: 0.6000 mg | ORAL_TABLET | Freq: Two times a day (BID) | ORAL | 1 refills | Status: DC

## 2017-09-26 NOTE — Addendum Note (Signed)
Addended by: Rutherford Guys on: 09/26/2017 09:45 AM   Modules accepted: Orders

## 2017-09-30 ENCOUNTER — Telehealth: Payer: Self-pay | Admitting: Family Medicine

## 2017-09-30 LAB — ANA,IFA RA DIAG PNL W/RFLX TIT/PATN
ANA Titer 1: POSITIVE — AB
Cyclic Citrullin Peptide Ab: 5 units (ref 0–19)
Rhuematoid fact SerPl-aCnc: <10 IU/mL (ref 0.0–13.9)

## 2017-09-30 LAB — FANA STAINING PATTERNS: Speckled Pattern: 1:320 {titer} — ABNORMAL HIGH

## 2017-09-30 LAB — SEDIMENTATION RATE: Sed Rate: 53 mm/hr — ABNORMAL HIGH (ref 0–40)

## 2017-09-30 LAB — URIC ACID: Uric Acid: 10.5 mg/dL — ABNORMAL HIGH (ref 2.5–7.1)

## 2017-09-30 LAB — CALCIUM, IONIZED: Calcium, Ion: 5.2 mg/dL (ref 4.5–5.6)

## 2017-09-30 NOTE — Telephone Encounter (Signed)
Copied from [place] [phone]. Topic: Quick Communication - See Telephone Encounter >> Sep 30, 2017  2:58 PM Ariana Cohen wrote: CRM for notification. See Telephone encounter for: 09/30/17. Pt is requesting results of xray

## 2017-09-30 NOTE — Addendum Note (Signed)
Addended by: Rutherford Guys on: 09/30/2017 06:59 PM   Modules accepted: Orders

## 2017-10-01 ENCOUNTER — Encounter: Payer: Self-pay | Admitting: Family Medicine

## 2017-10-01 ENCOUNTER — Ambulatory Visit: Payer: BLUE CROSS/BLUE SHIELD | Admitting: Family Medicine

## 2017-10-01 ENCOUNTER — Telehealth: Payer: Self-pay | Admitting: Internal Medicine

## 2017-10-01 DIAGNOSIS — M79675 Pain in left toe(s): Secondary | ICD-10-CM

## 2017-10-01 DIAGNOSIS — R5383 Other fatigue: Secondary | ICD-10-CM

## 2017-10-01 DIAGNOSIS — G4733 Obstructive sleep apnea (adult) (pediatric): Secondary | ICD-10-CM

## 2017-10-01 DIAGNOSIS — G4711 Idiopathic hypersomnia with long sleep time: Secondary | ICD-10-CM

## 2017-10-01 DIAGNOSIS — R0683 Snoring: Secondary | ICD-10-CM

## 2017-10-01 DIAGNOSIS — G4719 Other hypersomnia: Secondary | ICD-10-CM

## 2017-10-01 NOTE — Telephone Encounter (Signed)
LMTCB to discuss referral to sleep specialists in [place] area  Per MD:  RE: neuro/sleep specialist - [facility]  Received: 5 days ago  Message Contents  Hilty, Nadean Corwin, MD  Fidel Levy, RN        [facility] - refer to [place] at [facility] for narcolepsy.   Dr. Lemmie Evens

## 2017-10-01 NOTE — Patient Instructions (Signed)
You have acute gout of your 1st MTP joint of your great toe. Take ibuprofen 800mg  three times a day with food but only for 7-10 days then as needed. Colchicine - take 1 tablet a day for next couple days to make sure you tolerate this then I'd like you to take it twice a day until the pain is gone. Discussed stopping the hydrochlorothiazide with your family doctor as this can increase the uric acid. Icing 15 minutes at a time 3-4 times a day. If the pain is severe we can do a cortisone injection into the joint. Also can consider a walking boot or postop shoe to rest the area. Buddy taping typically helps also. Follow up with me in 2 weeks for reevaluation. Consider allopurinol to help prevent gout in the future.

## 2017-10-07 ENCOUNTER — Telehealth: Payer: Self-pay | Admitting: Family Medicine

## 2017-10-07 ENCOUNTER — Encounter: Payer: Self-pay | Admitting: Family Medicine

## 2017-10-07 ENCOUNTER — Other Ambulatory Visit: Payer: Self-pay | Admitting: *Deleted

## 2017-10-07 DIAGNOSIS — M79675 Pain in left toe(s): Secondary | ICD-10-CM | POA: Insufficient documentation

## 2017-10-07 MED ORDER — PREDNISONE 10 MG PO TABS: ORAL_TABLET | ORAL | 0 refills | Status: DC

## 2017-10-07 NOTE — Addendum Note (Signed)
Addended by: Fidel Levy on: 10/07/2017 11:30 AM   Modules accepted: Orders

## 2017-10-07 NOTE — Telephone Encounter (Signed)
Patient called and made aware of MD recommendation for referral. She agrees w/plan. Referral has been ordered. Staff message sent to [facility] scheduling pool to assist with referral.

## 2017-10-07 NOTE — Progress Notes (Signed)
PCP: Horald Pollen, MD Consultation requested by Grant Fontana MD  Subjective:   HPI: Patient is a 57 y.o. female here for left great toe pain.  Patient reports for about 2 1/2 months she's had pain in left great toe. Believes this started when she was running with flip flops - no injury but next day had pain, swelling at base of great toe. She had trouble putting pressure on this, sharp pain. Heat worsened this. Prednisone dose pack helped but didn't resolve this. Has been taking ibuprofen and wrapping this. No skin changes, numbness.  Past Medical History:  Diagnosis Date  . Anxiety   . Depression   . Fibroids   . Hypertension   . Urticaria     Current Outpatient Medications on File Prior to Visit  Medication Sig Dispense Refill  . cetirizine (ZYRTEC) 10 MG tablet Take 10 mg by mouth daily.    . hydrALAZINE (APRESOLINE) 100 MG tablet Take 100 mg by mouth 2 (two) times daily.    Marland Kitchen ibuprofen (ADVIL,MOTRIN) 800 MG tablet Take 1 tablet (800 mg total) by mouth every 8 (eight) hours as needed. 30 tablet 0  . nebivolol (BYSTOLIC) 10 MG tablet Take 1 tablet (10 mg total) by mouth daily. 21 tablet 0  . spironolactone (ALDACTONE) 25 MG tablet Take 25 mg by mouth daily.    Marland Kitchen telmisartan-hydrochlorothiazide (MICARDIS HCT) 80-25 MG tablet TAKE 1 TABLET BY MOUTH DAILY. 90 tablet 3   No current facility-administered medications on file prior to visit.     Past Surgical History:  Procedure Laterality Date  . [place]; 2000  . MYOMECTOMY  1992    Allergies  Allergen Reactions  . Norvasc [Amlodipine Besylate] Swelling  . Aspirin Hives  . Metoprolol Other (See Comments)    Fatigue, somnolence   . Minoxidil Other (See Comments)    Weight gain, extreme edema  Legs, feet, hands, lethargic      Social History   Socioeconomic History  . Marital status: Divorced    Spouse name: Not on file  . Number of children: Not on file  . Years of education: Not on  file  . Highest education level: Not on file  Occupational History    Comment: works 3rd [employer]  . Financial resource strain: Not on file  . Food insecurity:    Worry: Not on file    Inability: Not on file  . Transportation needs:    Medical: Not on file    Non-medical: Not on file  Tobacco Use  . Smoking status: Never Smoker  . Smokeless tobacco: Never Used  Substance and Sexual Activity  . Alcohol use: No  . Drug use: No  . Sexual activity: Yes  Lifestyle  . Physical activity:    Days per week: Not on file    Minutes per session: Not on file  . Stress: Not on file  Relationships  . Social connections:    Talks on phone: Not on file    Gets together: Not on file    Attends religious service: Not on file    Active member of club or organization: Not on file    Attends meetings of clubs or organizations: Not on file    Relationship status: Not on file  . Intimate partner violence:    Fear of current or ex partner: Not on file    Emotionally abused: Not on file    Physically abused: Not on file  Forced sexual activity: Not on file  Other Topics Concern  . Not on file  Social History Narrative   epworth sleepiness scale = 18 (08/11/15)    Family History  Problem Relation Age of Onset  . Hypertension Mother   . Renal Disease Mother   . Schizophrenia Mother   . Hypertension Father   . Heart disease Father        also OSA  . Hypertension Maternal Grandmother   . Heart attack Maternal Grandmother   . Thyroid disease Maternal Grandmother        thyroidectomy  . Hypertension Maternal Grandfather   . Multiple myeloma Maternal Grandfather   . Sleep apnea Maternal Grandfather   . Hypertension Sister   . Drug abuse Maternal Aunt     BP 110/60   Ht _0  (1.676 m)   Wt 223 lb (101.2 kg)   LMP 06/20/2013   BMI 35.99 kg/m   Review of Systems: See HPI above.     Objective:  Physical Exam:  Gen: NAD, comfortable in exam room  Left  foot/ankle: Mild swelling, warmth.  No bruising, other deformity. FROM ankle but mod pain, limitation on flexion and extension at 1st MTP. TTP circumferentially about 1st MTP. Negative ant drawer and talar tilt.   Negative syndesmotic compression. Negative metatarsal squeeze. Thompsons test negative. NV intact distally.  Right foot/ankle: No deformity. FROM with 5/5 strength. No tenderness to palpation. NVI distally.  MSK u/s left great toe:  Effusion and crystals seen in 1st MTP.  No other abnormalities   Assessment & Plan:  1. Left 1st MTP pain - history, exam, ultrasound consistent with acute gout flare of 1st MTP.  She has taken a prednisone dose pack.  Ibuprofen with colchicine.  Advised to discuss her HCTZ with PCP as this can increase uric acid level.  Icing, buddy taping.  Discussed walking boot or postop shoe to rest this.  Consider cortisone injection if not improving.  F/u in 2 weeks.  Discussed allopurinol in future but advised not taking this now as it can worsen an acute flare.

## 2017-10-07 NOTE — Telephone Encounter (Signed)
-----   Message from Ophelia Shoulder, [place] sent at 10/07/2017  9:15 AM EDT ----- Please send in the prednisone dose pack to the [facility] on [facility]. Thanks! ----- Message ----- From: Dene Gentry, MD Sent: 10/06/2017   3:37 PM To: Ophelia Shoulder, CMA  She doesn't have an Rx for it because it's not indicated and won't make her improve.  It actually makes gout flares worse if you take it.  It's only indicated weeks after a flare has gone away.  If she doesn't want to take the colchicine she can consider a cortisone injection into the joint if this is bothering her a lot.  Repeating prednisone dose pack is another consideration.   ----- Message ----- From: Ophelia Shoulder, CMA Sent: 10/06/2017   3:25 PM To: Dene Gentry, MD  Patient stated that she does not have a Rx for the allopurinol. She does not want to take the colchicine due to a side effect.

## 2017-10-07 NOTE — Telephone Encounter (Signed)
Sent in prednisone dose pack.

## 2017-10-07 NOTE — Assessment & Plan Note (Signed)
history, exam, ultrasound consistent with acute gout flare of 1st MTP.  She has taken a prednisone dose pack.  Ibuprofen with colchicine.  Advised to discuss her HCTZ with PCP as this can increase uric acid level.  Icing, buddy taping.  Discussed walking boot or postop shoe to rest this.  Consider cortisone injection if not improving.  F/u in 2 weeks.  Discussed allopurinol in future but advised not taking this now as it can worsen an acute flare.

## 2017-10-15 ENCOUNTER — Ambulatory Visit: Payer: Self-pay | Admitting: Family Medicine

## 2017-10-19 ENCOUNTER — Other Ambulatory Visit: Payer: Self-pay | Admitting: Cardiovascular Disease

## 2017-10-20 NOTE — Progress Notes (Deleted)
Office Visit Note  Patient: Ariana Cohen             Date of Birth: 08-11-1960           MRN: [id number]             PCP: Horald Pollen, MD Referring: Rutherford Guys, MD Visit Date: 10/29/2017 Occupation: '@GUAROCC'$ @    Subjective:  No chief complaint on file.   History of Present Illness: Ariana Cohen is a 57 y.o. female ***   Activities of Daily Living:  Patient reports morning stiffness for *** {minute/hour:19697}.   Patient {ACTIONS;DENIES/REPORTS:21021675::"Denies"} nocturnal pain.  Difficulty dressing/grooming: {ACTIONS;DENIES/REPORTS:21021675::"Denies"} Difficulty climbing stairs: {ACTIONS;DENIES/REPORTS:21021675::"Denies"} Difficulty getting out of chair: {ACTIONS;DENIES/REPORTS:21021675::"Denies"} Difficulty using hands for taps, buttons, cutlery, and/or writing: {ACTIONS;DENIES/REPORTS:21021675::"Denies"}   No Rheumatology ROS completed.   PMFS History:  Patient Active Problem List   Diagnosis Date Noted  . Great toe pain, left 10/07/2017  . Gout involving toe of left foot 08/25/2017  . Transient alteration of awareness 10/01/2016  . Memory loss 10/01/2016  . Obesity (BMI 30.0-34.9) 08/12/2016  . Onychomycosis 08/12/2016  . Acute reaction to situational stress 07/01/2016  . GAD (generalized anxiety disorder) 06/26/2016  . OSA (obstructive sleep apnea) 06/12/2016  . Snoring 06/11/2016  . Other fatigue 03/26/2016  . Medication management 11/06/2015  . Excessive daytime sleepiness 11/06/2015  . Murmur 08/11/2015  . PVCs (premature ventricular contractions) 08/11/2015  . Shortness of breath 08/11/2015  . Essential hypertension 08/11/2015    Past Medical History:  Diagnosis Date  . Anxiety   . Depression   . Fibroids   . Hypertension   . Urticaria     Family History  Problem Relation Age of Onset  . Hypertension Mother   . Renal Disease Mother   . Schizophrenia Mother   . Hypertension Father   . Heart disease Father        also OSA    . Hypertension Maternal Grandmother   . Heart attack Maternal Grandmother   . Thyroid disease Maternal Grandmother        thyroidectomy  . Hypertension Maternal Grandfather   . Multiple myeloma Maternal Grandfather   . Sleep apnea Maternal Grandfather   . Hypertension Sister   . Drug abuse Maternal Aunt    Past Surgical History:  Procedure Laterality Date  . [place]; 2000  . MYOMECTOMY  1992   Social History   Social History Narrative   epworth sleepiness scale = 18 (08/11/15)     Objective: Vital Signs: LMP 06/20/2013    Physical Exam   Musculoskeletal Exam: ***  CDAI Exam: No CDAI exam completed.    Investigation: Findings:  09/25/17: ANA 1:320 Speckled, RF -, CCP 5, Calcium ionized 5.2, Uric acid 10.5, Sed rate 53   Component     Latest Ref Rng & Units 09/25/2017  ANA Titer 1      Positive (A)  RA Latex Turbid.     0.0 - 13.9 IU/mL <12.8  Cyclic Citrullin Peptide Ab     0 - 19 units 5  Speckled Pattern      1:320 (H)  NOTE:      Comment  Calcium Ionized     4.5 - 5.6 mg/dL 5.2  Uric Acid     2.5 - 7.1 mg/dL 10.5 (H)  Sed Rate     0 - 40 mm/hr 53 (H)   CBC Latest Ref Rng & Units 09/20/2017 10/31/2016  WBC 4.0 - 10.5  K/uL 10.5 9.9  Hemoglobin 12.0 - 15.0 g/dL 13.5 14.3  Hematocrit 36.0 - 46.0 % 43.0 44.0  Platelets 150 - 400 K/uL 358 332   CMP Latest Ref Rng & Units 09/20/2017 10/31/2016 08/08/2016  Glucose 65 - 99 mg/dL 105(H) 102(H) 92  BUN 6 - 20 mg/dL '12 15 11  '$ Creatinine 0.44 - 1.00 mg/dL 0.88 0.79 0.86  Sodium 135 - 145 mmol/L 139 140 142  Potassium 3.5 - 5.1 mmol/L 3.8 3.8 3.6  Chloride 101 - 111 mmol/L 103 102 105  CO2 22 - 32 mmol/L '25 29 28  '$ Calcium 8.9 - 10.3 mg/dL 8.8(L) 9.4 9.3  Total Protein 6.1 - 8.1 g/dL - 7.3 -  Total Bilirubin 0.2 - 1.2 mg/dL - 0.7 -  Alkaline Phos 33 - 130 U/L - 112 -  AST 10 - 35 U/L - 24 -  ALT 6 - 29 U/L - 25 -     Imaging: Dg Foot Complete Left  Result Date: 09/25/2017 CLINICAL DATA:  MTP  joint for several weeks, no known injury, initial encounter EXAM: LEFT FOOT - COMPLETE 3+ VIEW COMPARISON:  None. FINDINGS: Diffuse soft tissue swelling is noted along the distal metatarsals. No acute fracture or dislocation is noted. No other focal abnormality is seen. IMPRESSION: Soft tissue swelling without acute bony abnormality. Electronically Signed   By: Inez Catalina M.D.   On: 09/25/2017 13:03    Speciality Comments: No specialty comments available.    Procedures:  No procedures performed Allergies: Norvasc [amlodipine besylate]; Aspirin; Metoprolol; and Minoxidil   Assessment / Plan:     Visit Diagnoses: Positive ANA (antinuclear antibody) - 1:320 Speckled    Orders: No orders of the defined types were placed in this encounter.  No orders of the defined types were placed in this encounter.   Face-to-face time spent with patient was *** minutes. 50% of time was spent in counseling and coordination of care.  Follow-Up Instructions: No follow-ups on file.   Ofilia Neas, PA-C  Note - This record has been created using Dragon software.  Chart creation errors have been sought, but may not always  have been located. Such creation errors do not reflect on  the standard of medical care.

## 2017-10-24 DIAGNOSIS — G478 Other sleep disorders: Secondary | ICD-10-CM | POA: Diagnosis not present

## 2017-10-24 DIAGNOSIS — R0683 Snoring: Secondary | ICD-10-CM | POA: Diagnosis not present

## 2017-10-24 DIAGNOSIS — G4710 Hypersomnia, unspecified: Secondary | ICD-10-CM | POA: Diagnosis not present

## 2017-10-29 ENCOUNTER — Ambulatory Visit: Payer: BLUE CROSS/BLUE SHIELD | Admitting: Rheumatology

## 2017-11-05 NOTE — Progress Notes (Signed)
Office Visit Note  Patient: Ariana Cohen             Date of Birth: 09/28/1960           MRN: [id number]             PCP: Horald Pollen, MD Referring: Rutherford Guys, MD Visit Date: 11/19/2017 Occupation: Data review specialist    Subjective:  Left great toe pain.   History of Present Illness: Ariana Cohen is a 57 y.o. female seen in consultation per request of her PCP.  According to patient about 3 to 4 months ago she developed pain and swelling in her left great toe.  At the time she was seen by her PCP and was given a prednisone taper.  She states the symptoms recurred a week later after stopping prednisone taper.  She went back to the PCP and was given another prednisone taper.  At the time she had labs done and was diagnosed with gout.  She was offered colchicine but she declined as she was concerned about the side effect of diarrhea.  Her labs also showed positive ANA.  She gives history of hives since she was in her 62s.  She states the hives will come and go and are mostly related to the preservatives she takes in her diet.  She also gets hives with aspirin.  She has tried taking over-the-counter antihistamines but they make her drowsy.  Eyes pain in any other joints.  Her last dose of prednisone was about 2 months ago.  She still continues to have some discomfort in her left  great toe.  Activities of Daily Living:  Patient reports morning stiffness for 0 minutes.   Patient Denies nocturnal pain.  Difficulty dressing/grooming: Denies Difficulty climbing stairs: Denies Difficulty getting out of chair: Denies Difficulty using hands for taps, buttons, cutlery, and/or writing: Denies   Review of Systems  Constitutional: Positive for fatigue. Negative for night sweats, weight gain and weight loss.  HENT: Negative for mouth sores, trouble swallowing, trouble swallowing, mouth dryness and nose dryness.   Eyes: Negative for pain, redness, visual disturbance and  dryness.  Respiratory: Negative for cough, shortness of breath and difficulty breathing.   Cardiovascular: Negative for chest pain, palpitations, hypertension, irregular heartbeat and swelling in legs/feet.  Gastrointestinal: Negative for abdominal pain, blood in stool, constipation and diarrhea.  Endocrine: Negative for increased urination.  Genitourinary: Negative for pelvic pain and vaginal dryness.  Musculoskeletal: Negative for arthralgias, joint pain, joint swelling, myalgias, muscle weakness, morning stiffness, muscle tenderness and myalgias.  Skin: Positive for rash. Negative for color change, hair loss, skin tightness, ulcers and sensitivity to sunlight.  Allergic/Immunologic: Negative for susceptible to infections.  Neurological: Positive for weakness. Negative for dizziness, light-headedness, headaches, memory loss and night sweats.  Hematological: Negative for bruising/bleeding tendency and swollen glands.  Psychiatric/Behavioral: Negative for depressed mood, confusion and sleep disturbance. The patient is not nervous/anxious.     PMFS History:  Patient Active Problem List   Diagnosis Date Noted  . Great toe pain, left 10/07/2017  . Gout involving toe of left foot 08/25/2017  . Transient alteration of awareness 10/01/2016  . Memory loss 10/01/2016  . Obesity (BMI 30.0-34.9) 08/12/2016  . Onychomycosis 08/12/2016  . Acute reaction to situational stress 07/01/2016  . GAD (generalized anxiety disorder) 06/26/2016  . OSA (obstructive sleep apnea) 06/12/2016  . Snoring 06/11/2016  . Other fatigue 03/26/2016  . Medication management 11/06/2015  . Excessive daytime sleepiness  11/06/2015  . Murmur 08/11/2015  . PVCs (premature ventricular contractions) 08/11/2015  . Shortness of breath 08/11/2015  . Essential hypertension 08/11/2015    Past Medical History:  Diagnosis Date  . Anxiety   . Depression   . Fibroids   . Hypertension   . Urticaria     Family History  Problem  Relation Age of Onset  . Hypertension Mother   . Renal Disease Mother   . Schizophrenia Mother   . Hypertension Father   . Heart disease Father        also OSA  . Hypertension Maternal Grandmother   . Heart attack Maternal Grandmother   . Thyroid disease Maternal Grandmother        thyroidectomy  . Hypertension Maternal Grandfather   . Multiple myeloma Maternal Grandfather   . Sleep apnea Maternal Grandfather   . Hypertension Sister   . Healthy Son   . Asthma Daughter   . Healthy Daughter    Past Surgical History:  Procedure Laterality Date  . [place]; 2000  . MYOMECTOMY  1992   Social History   Social History Narrative   epworth sleepiness scale = 18 (08/11/15)     Objective: Vital Signs: BP 113/68 (BP Location: Right Arm, Patient Position: Sitting, Cuff Size: Normal)   Pulse 74   Resp 13   Ht 5' 6.5" (1.689 m)   Wt 226 lb (102.5 kg)   LMP 06/20/2013   BMI 35.93 kg/m    Physical Exam  Constitutional: She is oriented to person, place, and time. She appears well-developed and well-nourished.  HENT:  Head: Normocephalic and atraumatic.  Eyes: Conjunctivae and EOM are normal.  Neck: Normal range of motion.  Cardiovascular: Normal rate, regular rhythm, normal heart sounds and intact distal pulses.  Pulmonary/Chest: Effort normal and breath sounds normal.  Abdominal: Soft. Bowel sounds are normal.  Lymphadenopathy:    She has no cervical adenopathy.  Neurological: She is alert and oriented to person, place, and time.  Skin: Skin is warm and dry. Capillary refill takes less than 2 seconds.  Psychiatric: She has a normal mood and affect. Her behavior is normal.  Nursing note and vitals reviewed.    Musculoskeletal Exam: On thoracic lumbar spine good range of motion.  Shoulder joints elbow joints wrist joint MCPs PIPs DIPs were in good range of motion with no synovitis.  Hip joints knee joints ankles MTPs PIPs DIPs were in good range of motion.  She has  mild tenderness over her left first MTP joint.  CDAI Exam: No CDAI exam completed.    Investigation: No additional findings. CBC Latest Ref Rng & Units 09/20/2017 10/31/2016  WBC 4.0 - 10.5 K/uL 10.5 9.9  Hemoglobin 12.0 - 15.0 g/dL 13.5 14.3  Hematocrit 36.0 - 46.0 % 43.0 44.0  Platelets 150 - 400 K/uL 358 332   CMP Latest Ref Rng & Units 09/20/2017 10/31/2016 08/08/2016  Glucose 65 - 99 mg/dL 105(H) 102(H) 92  BUN 6 - 20 mg/dL '12 15 11  '$ Creatinine 0.44 - 1.00 mg/dL 0.88 0.79 0.86  Sodium 135 - 145 mmol/L 139 140 142  Potassium 3.5 - 5.1 mmol/L 3.8 3.8 3.6  Chloride 101 - 111 mmol/L 103 102 105  CO2 22 - 32 mmol/L '25 29 28  '$ Calcium 8.9 - 10.3 mg/dL 8.8(L) 9.4 9.3  Total Protein 6.1 - 8.1 g/dL - 7.3 -  Total Bilirubin 0.2 - 1.2 mg/dL - 0.7 -  Alkaline Phos 33 - 130 U/L -  112 -  AST 10 - 35 U/L - 24 -  ALT 6 - 29 U/L - 25 -    Imaging: No results found.  Speciality Comments: No specialty comments available.    Procedures:  No procedures performed Allergies: Norvasc [amlodipine besylate]; Aspirin; Metoprolol; and Minoxidil   Assessment / Plan:     Visit Diagnoses: Idiopathic gout involving toe of left foot, unspecified chronicity - uric acid: 09/25/2017 10.5.  Patient has been having recurrent gout for the last 3 to 4 months.  She still has some tenderness over her left first MTP joint of her foot.  We had detailed discussion regarding gout.  Counseling was provided.  She is taking 2 diuretics which could be contributing to hyperuricemia.  She is also postmenopausal now.  Dietary and pharmacological management of gout was discussed at length.  I would like to start her on colchicine 0.6 mg p.o. daily she will increase the dose to twice daily for flares.  I will add allopurinol 100 mg p.o. daily and increase it by 100 mg after 2 weeks.  After a month she will increase to allopurinol 300 mg a day.  We will recheck her labs in 2 months.  If she has a flare we may have to use prednisone  during that time.  Side effects of allopurinol was discussed.  If she develops a rash she is supposed to notify and stop the allopurinol.  Positive ANA (antinuclear antibody) - 09/25/2017 speckled 1:320Sed rate: 53.  Patient gives history of hives for 30 years.  She currently does not have any rash.  She has no other clinical features of autoimmune disease.  I will obtain AVISE labs today.  Rash-history of hives related to preservative use.  I do not see any rash on exam today.  Essential hypertension-her blood pressure is well controlled today.  Patient states she had difficult time controlling her blood pressure.  Ideally if she could be off diuretics that will help her gout.  PVCs (premature ventricular contractions)  Murmur  Other fatigue  GAD (generalized anxiety disorder)    Orders: No orders of the defined types were placed in this encounter.  Meds ordered this encounter  Medications  . Colchicine (MITIGARE) 0.6 MG CAPS    Sig: Take 1 capsule by mouth daily, if experiencing gout flare increase to 2 capsules daily.    Dispense:  60 capsule    Refill:  2  . allopurinol (ZYLOPRIM) 100 MG tablet    Sig: Take '100mg'$  (1 tablet) by mouth daily for 2 weeks then increase to '200mg'$  (2 tablets) daily for 2 weeks.    Dispense:  42 tablet    Refill:  0    Face-to-face time spent with patient was 50 minutes. Greater than 50% of time was spent in counseling and coordination of care.  Follow-Up Instructions: Return for Gout, positive ANA.   Bo Merino, MD  Note - This record has been created using Editor, commissioning.  Chart creation errors have been sought, but may not always  have been located. Such creation errors do not reflect on  the standard of medical care.

## 2017-11-19 ENCOUNTER — Encounter: Payer: Self-pay | Admitting: Rheumatology

## 2017-11-19 ENCOUNTER — Ambulatory Visit: Payer: BLUE CROSS/BLUE SHIELD | Admitting: Rheumatology

## 2017-11-19 VITALS — BP 113/68 | HR 74 | Resp 13 | Ht 66.5 in | Wt 226.0 lb

## 2017-11-19 DIAGNOSIS — M10072 Idiopathic gout, left ankle and foot: Secondary | ICD-10-CM | POA: Diagnosis not present

## 2017-11-19 DIAGNOSIS — I10 Essential (primary) hypertension: Secondary | ICD-10-CM | POA: Diagnosis not present

## 2017-11-19 DIAGNOSIS — R011 Cardiac murmur, unspecified: Secondary | ICD-10-CM

## 2017-11-19 DIAGNOSIS — R5383 Other fatigue: Secondary | ICD-10-CM

## 2017-11-19 DIAGNOSIS — R21 Rash and other nonspecific skin eruption: Secondary | ICD-10-CM | POA: Diagnosis not present

## 2017-11-19 DIAGNOSIS — I493 Ventricular premature depolarization: Secondary | ICD-10-CM

## 2017-11-19 DIAGNOSIS — F411 Generalized anxiety disorder: Secondary | ICD-10-CM

## 2017-11-19 DIAGNOSIS — R768 Other specified abnormal immunological findings in serum: Secondary | ICD-10-CM

## 2017-11-19 MED ORDER — ALLOPURINOL 100 MG PO TABS: ORAL_TABLET | ORAL | 0 refills | Status: DC

## 2017-11-19 MED ORDER — COLCHICINE 0.6 MG PO CAPS: ORAL_CAPSULE | ORAL | 2 refills | Status: DC

## 2017-11-19 NOTE — Patient Instructions (Addendum)
Please a start colchicine 0.6 mg by mouth daily.  In case you have a flare increase colchicine to twice a day.  If the flare is severe please contact us for a prednisone taper. You will start allopurinol 100 mg daily for 2 weeks and then increase it to 200 mg daily for 2 weeks.  After 1 month we will increase allopurinol to 300 mg p.o. daily and stay on allopurinol.  Please just do not discontinue allopurinol if you have a gout flare.  Please contact us if you have any side effects from colchicine or allopurinol   Gout Gout is painful swelling that can occur in some of your joints. Gout is a type of arthritis. This condition is caused by having too much uric acid in your body. Uric acid is a chemical that forms when your body breaks down substances called purines. Purines are important for building body proteins. When your body has too much uric acid, sharp crystals can form and build up inside your joints. This causes pain and swelling. Gout attacks can happen quickly and be very painful (acute gout). Over time, the attacks can affect more joints and become more frequent (chronic gout). Gout can also cause uric acid to build up under your skin and inside your kidneys. What are the causes? This condition is caused by too much uric acid in your blood. This can occur because:  Your kidneys do not remove enough uric acid from your blood. This is the most common cause.  Your body makes too much uric acid. This can occur with some cancers and cancer treatments. It can also occur if your body is breaking down too many red blood cells (hemolytic anemia).  You eat too many foods that are high in purines. These foods include organ meats and some seafood. Alcohol, especially beer, is also high in purines.  A gout attack may be triggered by trauma or stress. What increases the risk? This condition is more likely to develop in people who:  Have a family history of gout.  Are female and middle-aged.  Are  female and have gone through menopause.  Are obese.  Frequently drink alcohol, especially beer.  Are dehydrated.  Lose weight too quickly.  Have an organ transplant.  Have lead poisoning.  Take certain medicines, including aspirin, cyclosporine, diuretics, levodopa, and niacin.  Have kidney disease or psoriasis.  What are the signs or symptoms? An attack of acute gout happens quickly. It usually occurs in just one joint. The most common place is the big toe. Attacks often start at night. Other joints that may be affected include joints of the feet, ankle, knee, fingers, wrist, or elbow. Symptoms may include:  Severe pain.  Warmth.  Swelling.  Stiffness.  Tenderness. The affected joint may be very painful to touch.  Shiny, red, or purple skin.  Chills and fever.  Chronic gout may cause symptoms more frequently. More joints may be involved. You may also have white or yellow lumps (tophi) on your hands or feet or in other areas near your joints. How is this diagnosed? This condition is diagnosed based on your symptoms, medical history, and physical exam. You may have tests, such as:  Blood tests to measure uric acid levels.  Removal of joint fluid with a needle (aspiration) to look for uric acid crystals.  X-rays to look for joint damage.  How is this treated? Treatment for this condition has two phases: treating an acute attack and preventing future attacks. Acute  gout treatment may include medicines to reduce pain and swelling, including:  NSAIDs.  Steroids. These are strong anti-inflammatory medicines that can be taken by mouth (orally) or injected into a joint.  Colchicine. This medicine relieves pain and swelling when it is taken soon after an attack. It can be given orally or through an IV tube.  Preventive treatment may include:  Daily use of smaller doses of NSAIDs or colchicine.  Use of a medicine that reduces uric acid levels in your blood.  Changes  to your diet. You may need to see a specialist about healthy eating (dietitian).  Follow these instructions at home: During a Gout Attack  If directed, apply ice to the affected area: ? Put ice in a plastic bag. ? Place a towel between your skin and the bag. ? Leave the ice on for 20 minutes, 2-3 times a day.  Rest the joint as much as possible. If the affected joint is in your leg, you may be given crutches to use.  Raise (elevate) the affected joint above the level of your heart as often as possible.  Drink enough fluids to keep your urine clear or pale yellow.  Take over-the-counter and prescription medicines only as told by your health care provider.  Do not drive or operate heavy machinery while taking prescription pain medicine.  Follow instructions from your health care provider about eating or drinking restrictions.  Return to your normal activities as told by your health care provider. Ask your health care provider what activities are safe for you. Avoiding Future Gout Attacks  Follow a low-purine diet as told by your dietitian or health care provider. Avoid foods and drinks that are high in purines, including liver, kidney, anchovies, asparagus, herring, mushrooms, mussels, and beer.  Limit alcohol intake to no more than 1 drink a day for nonpregnant women and 2 drinks a day for men. One drink equals 12 oz of beer, 5 oz of wine, or 1 oz of hard liquor.  Maintain a healthy weight or lose weight if you are overweight. If you want to lose weight, talk with your health care provider. It is important that you do not lose weight too quickly.  Start or maintain an exercise program as told by your health care provider.  Drink enough fluids to keep your urine clear or pale yellow.  Take over-the-counter and prescription medicines only as told by your health care provider.  Keep all follow-up visits as told by your health care provider. This is important. Contact a health care  provider if:  You have another gout attack.  You continue to have symptoms of a gout attack after10 days of treatment.  You have side effects from your medicines.  You have chills or a fever.  You have burning pain when you urinate.  You have pain in your lower back or belly. Get help right away if:  You have severe or uncontrolled pain.  You cannot urinate. This information is not intended to replace advice given to you by your health care provider. Make sure you discuss any questions you have with your health care provider. Document Released: 04/19/2000 Document Revised: 09/28/2015 Document Reviewed: 02/02/2015 Elsevier Interactive Patient Education  [employer].

## 2017-12-10 ENCOUNTER — Ambulatory Visit: Payer: BLUE CROSS/BLUE SHIELD | Admitting: Rheumatology

## 2017-12-12 ENCOUNTER — Other Ambulatory Visit: Payer: Self-pay | Admitting: Rheumatology

## 2017-12-12 NOTE — Telephone Encounter (Signed)
Patient is having a flare because she came off colchicine.  We reviewed her labs and her CMP was normal.  She can start on meloxicam 7.5 mg p.o. daily and stay on the medication.  Allopurinol should be at 100 mg for 2 weeks and then she should be able to increase it to 2 tablets p.o. daily.

## 2017-12-12 NOTE — Telephone Encounter (Signed)
Patient should be increasing to 300mg  of allopurinol daily. I called patient and patient states she has been tolerating the allopurinol well but she did stop taking the colchicine due to diarrhea. Patient states she does not think the allopurinol is working. Any recommendations?

## 2017-12-12 NOTE — Telephone Encounter (Signed)
I attempted to call patient to discuss Dr. Arlean Hopping recommendations.  I left a message for her to call us back at the office.  She has a follow up appointment on 12/31/17.

## 2017-12-16 NOTE — Telephone Encounter (Signed)
Pt can stay on Allopurinol 200 mg po daily for 2 weeks before increasing to Allopurinol 300 mg po daily.  She should take Meloxicam 7.5 mg po daily.

## 2017-12-17 NOTE — Telephone Encounter (Signed)
Attempted to call patient and left message on machine for patient to call the office.  

## 2017-12-18 MED ORDER — MELOXICAM 7.5 MG PO TABS: 7.5000 mg | ORAL_TABLET | Freq: Every day | ORAL | 0 refills | Status: DC

## 2017-12-18 NOTE — Telephone Encounter (Signed)
Patient returned the call and was advised of medication changes. Patient verbalized understanding.   Spoke to [employer] about allergy to aspirin when sending in the meloxicam prescription.

## 2017-12-18 NOTE — Telephone Encounter (Signed)
Patient had allergy listed to aspirin but has tolerated ibuprofen in the past.  Okay to take meloxicam.

## 2017-12-19 NOTE — Progress Notes (Deleted)
Office Visit Note  Patient: Ariana Cohen             Date of Birth: [birth year]           MRN: [id number]             PCP: Horald Pollen, MD Referring: Horald Pollen, * Visit Date: 12/31/2017 Occupation: '@GUAROCC'$ @  Subjective:  No chief complaint on file.   History of Present Illness: Ariana Cohen is a 57 y.o. female ***   Activities of Daily Living:  Patient reports morning stiffness for *** {minute/hour:19697}.   Patient {ACTIONS;DENIES/REPORTS:21021675::"Denies"} nocturnal pain.  Difficulty dressing/grooming: {ACTIONS;DENIES/REPORTS:21021675::"Denies"} Difficulty climbing stairs: {ACTIONS;DENIES/REPORTS:21021675::"Denies"} Difficulty getting out of chair: {ACTIONS;DENIES/REPORTS:21021675::"Denies"} Difficulty using hands for taps, buttons, cutlery, and/or writing: {ACTIONS;DENIES/REPORTS:21021675::"Denies"}  No Rheumatology ROS completed.   PMFS History:  Patient Active Problem List   Diagnosis Date Noted  . Great toe pain, left 10/07/2017  . Gout involving toe of left foot 08/25/2017  . Transient alteration of awareness 10/01/2016  . Memory loss 10/01/2016  . Obesity (BMI 30.0-34.9) 08/12/2016  . Onychomycosis 08/12/2016  . Acute reaction to situational stress 07/01/2016  . GAD (generalized anxiety disorder) 06/26/2016  . OSA (obstructive sleep apnea) 06/12/2016  . Snoring 06/11/2016  . Other fatigue 03/26/2016  . Medication management 11/06/2015  . Excessive daytime sleepiness 11/06/2015  . Murmur 08/11/2015  . PVCs (premature ventricular contractions) 08/11/2015  . Shortness of breath 08/11/2015  . Essential hypertension 08/11/2015    Past Medical History:  Diagnosis Date  . Anxiety   . Depression   . Fibroids   . Hypertension   . Urticaria     Family History  Problem Relation Age of Onset  . Hypertension Mother   . Renal Disease Mother   . Schizophrenia Mother   . Hypertension Father   . Heart disease Father        also OSA    . Hypertension Maternal Grandmother   . Heart attack Maternal Grandmother   . Thyroid disease Maternal Grandmother        thyroidectomy  . Hypertension Maternal Grandfather   . Multiple myeloma Maternal Grandfather   . Sleep apnea Maternal Grandfather   . Hypertension Sister   . Healthy Son   . Asthma Daughter   . Healthy Daughter    Past Surgical History:  Procedure Laterality Date  . [place]; 2000  . MYOMECTOMY  1992   Social History   Social History Narrative   epworth sleepiness scale = 18 (08/11/15)    Objective: Vital Signs: LMP 06/20/2013    Physical Exam   Musculoskeletal Exam: ***  CDAI Exam: CDAI Score: Not documented Patient Global Assessment: Not documented; Provider Global Assessment: Not documented Swollen: Not documented; Tender: Not documented Joint Exam   Not documented   There is currently no information documented on the homunculus. Go to the Rheumatology activity and complete the homunculus joint exam.  Investigation: No additional findings.  Imaging: No results found.  Recent Labs: Lab Results  Component Value Date   WBC 10.5 09/20/2017   HGB 13.5 09/20/2017   PLT 358 09/20/2017   NA 139 09/20/2017   K 3.8 09/20/2017   CL 103 09/20/2017   CO2 25 09/20/2017   GLUCOSE 105 (H) 09/20/2017   BUN 12 09/20/2017   CREATININE 0.88 09/20/2017   BILITOT 0.7 10/31/2016   ALKPHOS 112 10/31/2016   AST 24 10/31/2016   ALT 25 10/31/2016   PROT 7.3 10/31/2016  ALBUMIN 4.0 10/31/2016   CALCIUM 8.8 (L) 09/20/2017   GFRAA >60 09/20/2017    Speciality Comments: No specialty comments available.  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  No orders of the defined types were placed in this encounter.   Face-to-face time spent with patient was *** minutes. Greater than 50% of time was spent in counseling and coordination of care.  Follow-Up Instructions: No follow-ups on file.   Bo Merino, MD  Note - This record has been created using Editor, commissioning.  Chart creation errors have been sought, but may not always  have been located. Such creation errors do not reflect on  the standard of medical care.

## 2017-12-26 ENCOUNTER — Ambulatory Visit: Payer: Self-pay | Admitting: Internal Medicine

## 2017-12-29 DIAGNOSIS — R21 Rash and other nonspecific skin eruption: Secondary | ICD-10-CM | POA: Diagnosis not present

## 2017-12-29 DIAGNOSIS — R768 Other specified abnormal immunological findings in serum: Secondary | ICD-10-CM | POA: Diagnosis not present

## 2017-12-30 NOTE — Progress Notes (Deleted)
Office Visit Note  Patient: Ariana Cohen             Date of Birth: [birth year]           MRN: [id number]             PCP: Horald Pollen, MD Referring: Horald Pollen, * Visit Date: 01/13/2018 Occupation: '@GUAROCC'$ @  Subjective:  No chief complaint on file.   History of Present Illness: Ariana Cohen is a 57 y.o. female ***   Activities of Daily Living:  Patient reports morning stiffness for *** {minute/hour:19697}.   Patient {ACTIONS;DENIES/REPORTS:21021675::"Denies"} nocturnal pain.  Difficulty dressing/grooming: {ACTIONS;DENIES/REPORTS:21021675::"Denies"} Difficulty climbing stairs: {ACTIONS;DENIES/REPORTS:21021675::"Denies"} Difficulty getting out of chair: {ACTIONS;DENIES/REPORTS:21021675::"Denies"} Difficulty using hands for taps, buttons, cutlery, and/or writing: {ACTIONS;DENIES/REPORTS:21021675::"Denies"}  No Rheumatology ROS completed.   PMFS History:  Patient Active Problem List   Diagnosis Date Noted  . Great toe pain, left 10/07/2017  . Gout involving toe of left foot 08/25/2017  . Transient alteration of awareness 10/01/2016  . Memory loss 10/01/2016  . Obesity (BMI 30.0-34.9) 08/12/2016  . Onychomycosis 08/12/2016  . Acute reaction to situational stress 07/01/2016  . GAD (generalized anxiety disorder) 06/26/2016  . OSA (obstructive sleep apnea) 06/12/2016  . Snoring 06/11/2016  . Other fatigue 03/26/2016  . Medication management 11/06/2015  . Excessive daytime sleepiness 11/06/2015  . Murmur 08/11/2015  . PVCs (premature ventricular contractions) 08/11/2015  . Shortness of breath 08/11/2015  . Essential hypertension 08/11/2015    Past Medical History:  Diagnosis Date  . Anxiety   . Depression   . Fibroids   . Hypertension   . Urticaria     Family History  Problem Relation Age of Onset  . Hypertension Mother   . Renal Disease Mother   . Schizophrenia Mother   . Hypertension Father   . Heart disease Father        also OSA    . Hypertension Maternal Grandmother   . Heart attack Maternal Grandmother   . Thyroid disease Maternal Grandmother        thyroidectomy  . Hypertension Maternal Grandfather   . Multiple myeloma Maternal Grandfather   . Sleep apnea Maternal Grandfather   . Hypertension Sister   . Healthy Son   . Asthma Daughter   . Healthy Daughter    Past Surgical History:  Procedure Laterality Date  . [place]; 2000  . MYOMECTOMY  1992   Social History   Social History Narrative   epworth sleepiness scale = 18 (08/11/15)    Objective: Vital Signs: LMP 06/20/2013    Physical Exam   Musculoskeletal Exam: ***  CDAI Exam: CDAI Score: Not documented Patient Global Assessment: Not documented; Provider Global Assessment: Not documented Swollen: Not documented; Tender: Not documented Joint Exam   Not documented   There is currently no information documented on the homunculus. Go to the Rheumatology activity and complete the homunculus joint exam.  Investigation: No additional findings.  Imaging: No results found.  Recent Labs: Lab Results  Component Value Date   WBC 10.5 09/20/2017   HGB 13.5 09/20/2017   PLT 358 09/20/2017   NA 139 09/20/2017   K 3.8 09/20/2017   CL 103 09/20/2017   CO2 25 09/20/2017   GLUCOSE 105 (H) 09/20/2017   BUN 12 09/20/2017   CREATININE 0.88 09/20/2017   BILITOT 0.7 10/31/2016   ALKPHOS 112 10/31/2016   AST 24 10/31/2016   ALT 25 10/31/2016   PROT 7.3 10/31/2016  ALBUMIN 4.0 10/31/2016   CALCIUM 8.8 (L) 09/20/2017   GFRAA >60 09/20/2017    Speciality Comments: No specialty comments available.  Procedures:  No procedures performed Allergies: Norvasc [amlodipine besylate]; Aspirin; Metoprolol; and Minoxidil   Assessment / Plan:     Visit Diagnoses: No diagnosis found.   Orders: No orders of the defined types were placed in this encounter.  No orders of the defined types were placed in this encounter.   Face-to-face  time spent with patient was *** minutes. Greater than 50% of time was spent in counseling and coordination of care.  Follow-Up Instructions: No follow-ups on file.   Bo Merino, MD  Note - This record has been created using Editor, commissioning.  Chart creation errors have been sought, but may not always  have been located. Such creation errors do not reflect on  the standard of medical care.

## 2017-12-31 ENCOUNTER — Ambulatory Visit: Payer: BLUE CROSS/BLUE SHIELD | Admitting: Rheumatology

## 2018-01-13 ENCOUNTER — Ambulatory Visit: Payer: BLUE CROSS/BLUE SHIELD | Admitting: Rheumatology

## 2018-01-20 ENCOUNTER — Other Ambulatory Visit: Payer: Self-pay | Admitting: Rheumatology

## 2018-01-20 NOTE — Telephone Encounter (Signed)
Last Visit: 11/19/17 Next visit: 02/03/18 Labs: 09/20/17 decreased calcium  Okay to refill per Dr. Estanislado Pandy

## 2018-01-21 DIAGNOSIS — R21 Rash and other nonspecific skin eruption: Secondary | ICD-10-CM | POA: Insufficient documentation

## 2018-01-21 DIAGNOSIS — E790 Hyperuricemia without signs of inflammatory arthritis and tophaceous disease: Secondary | ICD-10-CM | POA: Insufficient documentation

## 2018-01-21 NOTE — Progress Notes (Deleted)
Office Visit Note  Patient: Ariana Cohen             Date of Birth: 1960/08/15           MRN: [id number]             PCP: Horald Pollen, MD Referring: Horald Pollen, * Visit Date: 02/03/2018 Occupation: '@GUAROCC'$ @  Subjective:  No chief complaint on file.   History of Present Illness: Ariana Cohen is a 57 y.o. female ***   Activities of Daily Living:  Patient reports morning stiffness for *** {minute/hour:19697}.   Patient {ACTIONS;DENIES/REPORTS:21021675::"Denies"} nocturnal pain.  Difficulty dressing/grooming: {ACTIONS;DENIES/REPORTS:21021675::"Denies"} Difficulty climbing stairs: {ACTIONS;DENIES/REPORTS:21021675::"Denies"} Difficulty getting out of chair: {ACTIONS;DENIES/REPORTS:21021675::"Denies"} Difficulty using hands for taps, buttons, cutlery, and/or writing: {ACTIONS;DENIES/REPORTS:21021675::"Denies"}  No Rheumatology ROS completed.   PMFS History:  Patient Active Problem List   Diagnosis Date Noted  . Great toe pain, left 10/07/2017  . Gout involving toe of left foot 08/25/2017  . Transient alteration of awareness 10/01/2016  . Memory loss 10/01/2016  . Obesity (BMI 30.0-34.9) 08/12/2016  . Onychomycosis 08/12/2016  . Acute reaction to situational stress 07/01/2016  . GAD (generalized anxiety disorder) 06/26/2016  . OSA (obstructive sleep apnea) 06/12/2016  . Snoring 06/11/2016  . Other fatigue 03/26/2016  . Medication management 11/06/2015  . Excessive daytime sleepiness 11/06/2015  . Murmur 08/11/2015  . PVCs (premature ventricular contractions) 08/11/2015  . Shortness of breath 08/11/2015  . Essential hypertension 08/11/2015    Past Medical History:  Diagnosis Date  . Anxiety   . Depression   . Fibroids   . Hypertension   . Urticaria     Family History  Problem Relation Age of Onset  . Hypertension Mother   . Renal Disease Mother   . Schizophrenia Mother   . Hypertension Father   . Heart disease Father        also OSA    . Hypertension Maternal Grandmother   . Heart attack Maternal Grandmother   . Thyroid disease Maternal Grandmother        thyroidectomy  . Hypertension Maternal Grandfather   . Multiple myeloma Maternal Grandfather   . Sleep apnea Maternal Grandfather   . Hypertension Sister   . Healthy Son   . Asthma Daughter   . Healthy Daughter    Past Surgical History:  Procedure Laterality Date  . [place]; 2000  . MYOMECTOMY  1992   Social History   Social History Narrative   epworth sleepiness scale = 18 (08/11/15)    Objective: Vital Signs: LMP 06/20/2013    Physical Exam   Musculoskeletal Exam: ***  CDAI Exam: CDAI Score: Not documented Patient Global Assessment: Not documented; Provider Global Assessment: Not documented Swollen: Not documented; Tender: Not documented Joint Exam   Not documented   There is currently no information documented on the homunculus. Go to the Rheumatology activity and complete the homunculus joint exam.  Investigation: No additional findings.  Imaging: No results found.  Recent Labs: Lab Results  Component Value Date   WBC 10.5 09/20/2017   HGB 13.5 09/20/2017   PLT 358 09/20/2017   NA 139 09/20/2017   K 3.8 09/20/2017   CL 103 09/20/2017   CO2 25 09/20/2017   GLUCOSE 105 (H) 09/20/2017   BUN 12 09/20/2017   CREATININE 0.88 09/20/2017   BILITOT 0.7 10/31/2016   ALKPHOS 112 10/31/2016   AST 24 10/31/2016   ALT 25 10/31/2016   PROT 7.3 10/31/2016  ALBUMIN 4.0 10/31/2016   CALCIUM 8.8 (L) 09/20/2017   GFRAA >60 09/20/2017    Speciality Comments: No specialty comments available.  Procedures:  No procedures performed Allergies: Norvasc [amlodipine besylate]; Aspirin; Metoprolol; and Minoxidil   Assessment / Plan:     Visit Diagnoses: No diagnosis found.   Orders: No orders of the defined types were placed in this encounter.  No orders of the defined types were placed in this encounter.   Face-to-face  time spent with patient was *** minutes. Greater than 50% of time was spent in counseling and coordination of care.  Follow-Up Instructions: No follow-ups on file.   Bo Merino, MD  Note - This record has been created using Editor, commissioning.  Chart creation errors have been sought, but may not always  have been located. Such creation errors do not reflect on  the standard of medical care.

## 2018-01-23 ENCOUNTER — Other Ambulatory Visit: Payer: Self-pay | Admitting: Rheumatology

## 2018-01-23 NOTE — Telephone Encounter (Signed)
Last Visit: 11/19/17 Next Visit: 02/03/18 Labs: 09/20/17 Calcium mildly decreased.  Okay to refill per Dr.Deveshwar

## 2018-02-01 DIAGNOSIS — G4730 Sleep apnea, unspecified: Secondary | ICD-10-CM | POA: Diagnosis not present

## 2018-02-01 DIAGNOSIS — R0683 Snoring: Secondary | ICD-10-CM | POA: Diagnosis not present

## 2018-02-01 DIAGNOSIS — G4733 Obstructive sleep apnea (adult) (pediatric): Secondary | ICD-10-CM | POA: Diagnosis not present

## 2018-02-03 ENCOUNTER — Ambulatory Visit: Payer: BLUE CROSS/BLUE SHIELD | Admitting: Rheumatology

## 2018-02-15 ENCOUNTER — Other Ambulatory Visit: Payer: Self-pay | Admitting: Rheumatology

## 2018-02-17 ENCOUNTER — Other Ambulatory Visit: Payer: Self-pay | Admitting: Cardiovascular Disease

## 2018-02-17 NOTE — Telephone Encounter (Signed)
Rx has been sent to the pharmacy electronically. ° °

## 2018-02-24 DIAGNOSIS — N3941 Urge incontinence: Secondary | ICD-10-CM | POA: Diagnosis not present

## 2018-02-24 DIAGNOSIS — Z1231 Encounter for screening mammogram for malignant neoplasm of breast: Secondary | ICD-10-CM | POA: Diagnosis not present

## 2018-02-24 DIAGNOSIS — Z01411 Encounter for gynecological examination (general) (routine) with abnormal findings: Secondary | ICD-10-CM | POA: Diagnosis not present

## 2018-02-27 DIAGNOSIS — M1A09X0 Idiopathic chronic gout, multiple sites, without tophus (tophi): Secondary | ICD-10-CM | POA: Insufficient documentation

## 2018-02-27 NOTE — Progress Notes (Deleted)
Office Visit Note  Patient: Ariana Cohen             Date of Birth: 1961-01-26           MRN: [id number]             PCP: Horald Pollen, MD Referring: Horald Pollen, * Visit Date: 03/05/2018 Occupation: '@GUAROCC'$ @  Subjective:  No chief complaint on file.   History of Present Illness: Ariana Cohen is a 57 y.o. female ***   Activities of Daily Living:  Patient reports morning stiffness for *** {minute/hour:19697}.   Patient {ACTIONS;DENIES/REPORTS:21021675::"Denies"} nocturnal pain.  Difficulty dressing/grooming: {ACTIONS;DENIES/REPORTS:21021675::"Denies"} Difficulty climbing stairs: {ACTIONS;DENIES/REPORTS:21021675::"Denies"} Difficulty getting out of chair: {ACTIONS;DENIES/REPORTS:21021675::"Denies"} Difficulty using hands for taps, buttons, cutlery, and/or writing: {ACTIONS;DENIES/REPORTS:21021675::"Denies"}  No Rheumatology ROS completed.   PMFS History:  Patient Active Problem List   Diagnosis Date Noted  . Hyperuricemia 01/21/2018  . Rash 01/21/2018  . Great toe pain, left 10/07/2017  . Gout involving toe of left foot 08/25/2017  . Transient alteration of awareness 10/01/2016  . Memory loss 10/01/2016  . Obesity (BMI 30.0-34.9) 08/12/2016  . Onychomycosis 08/12/2016  . Acute reaction to situational stress 07/01/2016  . GAD (generalized anxiety disorder) 06/26/2016  . OSA (obstructive sleep apnea) 06/12/2016  . Snoring 06/11/2016  . Other fatigue 03/26/2016  . Medication management 11/06/2015  . Excessive daytime sleepiness 11/06/2015  . Murmur 08/11/2015  . PVCs (premature ventricular contractions) 08/11/2015  . Shortness of breath 08/11/2015  . Essential hypertension 08/11/2015    Past Medical History:  Diagnosis Date  . Anxiety   . Depression   . Fibroids   . Hypertension   . Urticaria     Family History  Problem Relation Age of Onset  . Hypertension Mother   . Renal Disease Mother   . Schizophrenia Mother   . Hypertension  Father   . Heart disease Father        also OSA  . Hypertension Maternal Grandmother   . Heart attack Maternal Grandmother   . Thyroid disease Maternal Grandmother        thyroidectomy  . Hypertension Maternal Grandfather   . Multiple myeloma Maternal Grandfather   . Sleep apnea Maternal Grandfather   . Hypertension Sister   . Healthy Son   . Asthma Daughter   . Healthy Daughter    Past Surgical History:  Procedure Laterality Date  . [place]; 2000  . MYOMECTOMY  1992   Social History   Social History Narrative   epworth sleepiness scale = 18 (08/11/15)    Objective: Vital Signs: LMP 06/20/2013    Physical Exam   Musculoskeletal Exam: ***  CDAI Exam: CDAI Score: Not documented Patient Global Assessment: Not documented; Provider Global Assessment: Not documented Swollen: Not documented; Tender: Not documented Joint Exam   Not documented   There is currently no information documented on the homunculus. Go to the Rheumatology activity and complete the homunculus joint exam.  Investigation: Findings:  AVISE 26 2019 lupus index -0.5, ANA 1: 640 homogeneous, ENA negative, CB CAP negative, anticardiolipin negative, beta-2 negative, RF negative, anti-CCP negative, antithyroglobulin negative, antithyroid peroxidase negative, anti-carbamylated protein IgG positive   Imaging: No results found.  Recent Labs: Lab Results  Component Value Date   WBC 10.5 09/20/2017   HGB 13.5 09/20/2017   PLT 358 09/20/2017   NA 139 09/20/2017   K 3.8 09/20/2017   CL 103 09/20/2017   CO2 25 09/20/2017   GLUCOSE 105 (  H) 09/20/2017   BUN 12 09/20/2017   CREATININE 0.88 09/20/2017   BILITOT 0.7 10/31/2016   ALKPHOS 112 10/31/2016   AST 24 10/31/2016   ALT 25 10/31/2016   PROT 7.3 10/31/2016   ALBUMIN 4.0 10/31/2016   CALCIUM 8.8 (L) 09/20/2017   GFRAA >60 09/20/2017    Speciality Comments: No specialty comments available.  Procedures:  No procedures  performed Allergies: Norvasc [amlodipine besylate]; Aspirin; Metoprolol; and Minoxidil   Assessment / Plan:     Visit Diagnoses: No diagnosis found.   Orders: No orders of the defined types were placed in this encounter.  No orders of the defined types were placed in this encounter.   Face-to-face time spent with patient was *** minutes. Greater than 50% of time was spent in counseling and coordination of care.  Follow-Up Instructions: No follow-ups on file.   Bo Merino, MD  Note - This record has been created using Editor, commissioning.  Chart creation errors have been sought, but may not always  have been located. Such creation errors do not reflect on  the standard of medical care.

## 2018-03-05 ENCOUNTER — Ambulatory Visit: Payer: BLUE CROSS/BLUE SHIELD | Admitting: Rheumatology

## 2018-03-10 DIAGNOSIS — N6312 Unspecified lump in the right breast, upper inner quadrant: Secondary | ICD-10-CM | POA: Diagnosis not present

## 2018-03-10 DIAGNOSIS — N6315 Unspecified lump in the right breast, overlapping quadrants: Secondary | ICD-10-CM | POA: Diagnosis not present

## 2018-03-16 ENCOUNTER — Other Ambulatory Visit: Payer: Self-pay | Admitting: Radiology

## 2018-03-16 DIAGNOSIS — N6312 Unspecified lump in the right breast, upper inner quadrant: Secondary | ICD-10-CM | POA: Diagnosis not present

## 2018-03-16 DIAGNOSIS — N649 Disorder of breast, unspecified: Secondary | ICD-10-CM | POA: Diagnosis not present

## 2018-03-23 DIAGNOSIS — R7689 Other specified abnormal immunological findings in serum: Secondary | ICD-10-CM | POA: Insufficient documentation

## 2018-03-23 DIAGNOSIS — R768 Other specified abnormal immunological findings in serum: Secondary | ICD-10-CM | POA: Insufficient documentation

## 2018-03-23 NOTE — Progress Notes (Signed)
Office Visit Note  Patient: Ariana Cohen             Date of Birth: 10-Mar-1961           MRN: [id number]             PCP: Horald Pollen, MD Referring: Horald Pollen, * Visit Date: 04/01/2018 Occupation: '@GUAROCC'$ @  Subjective:  Foot pain   History of Present Illness: Ariana Cohen is a 57 y.o. female with history of gouty arthropathy and positive ANA.  She was a started on allopurinol after the last visit in May 2019.  At that time her uric acid was 10.5.  She states from allopurinol 100 she went to 200 mg a day.  Which she tolerated well.  She ran out of allopurinol in October and has been off the medication.  She also ran out of the meloxicam.  She has been having some discomfort in her left first toe.  Activities of Daily Living:  Patient reports morning stiffness for 0 minute.   Patient Denies nocturnal pain.  Difficulty dressing/grooming: Denies Difficulty climbing stairs: Denies Difficulty getting out of chair: Denies Difficulty using hands for taps, buttons, cutlery, and/or writing: Denies  Review of Systems  Constitutional: Negative for fatigue, night sweats, weight gain and weight loss.  HENT: Negative for mouth sores, trouble swallowing, trouble swallowing, mouth dryness and nose dryness.   Eyes: Negative for pain, redness, visual disturbance and dryness.  Respiratory: Negative for cough, shortness of breath and difficulty breathing.   Cardiovascular: Negative for chest pain, palpitations, hypertension, irregular heartbeat and swelling in legs/feet.  Gastrointestinal: Negative for blood in stool, constipation and diarrhea.  Endocrine: Negative for increased urination.  Genitourinary: Negative for vaginal dryness.  Musculoskeletal: Positive for arthralgias and joint pain. Negative for joint swelling, myalgias, muscle weakness, morning stiffness, muscle tenderness and myalgias.  Skin: Negative for color change, rash, hair loss, skin tightness, ulcers  and sensitivity to sunlight.  Allergic/Immunologic: Negative for susceptible to infections.  Neurological: Negative for dizziness, memory loss, night sweats and weakness.  Hematological: Negative for swollen glands.  Psychiatric/Behavioral: Negative for depressed mood and sleep disturbance. The patient is not nervous/anxious.     PMFS History:  Patient Active Problem List   Diagnosis Date Noted  . History of sleep apnea 04/01/2018  . Positive ANA (antinuclear antibody) 03/23/2018  . Idiopathic chronic gout of multiple sites without tophus 02/27/2018  . Hyperuricemia 01/21/2018  . Rash 01/21/2018  . Great toe pain, left 10/07/2017  . Gout involving toe of left foot 08/25/2017  . Transient alteration of awareness 10/01/2016  . Memory loss 10/01/2016  . Obesity (BMI 30.0-34.9) 08/12/2016  . Onychomycosis 08/12/2016  . Acute reaction to situational stress 07/01/2016  . GAD (generalized anxiety disorder) 06/26/2016  . OSA (obstructive sleep apnea) 06/12/2016  . Snoring 06/11/2016  . Other fatigue 03/26/2016  . Medication management 11/06/2015  . Excessive daytime sleepiness 11/06/2015  . Murmur 08/11/2015  . PVCs (premature ventricular contractions) 08/11/2015  . Shortness of breath 08/11/2015  . Essential hypertension 08/11/2015    Past Medical History:  Diagnosis Date  . Anxiety   . Depression   . Fibroids   . Hypertension   . Urticaria     Family History  Problem Relation Age of Onset  . Hypertension Mother   . Renal Disease Mother   . Schizophrenia Mother   . Hypertension Father   . Heart disease Father        also  OSA  . Hypertension Maternal Grandmother   . Heart attack Maternal Grandmother   . Thyroid disease Maternal Grandmother        thyroidectomy  . Hypertension Maternal Grandfather   . Multiple myeloma Maternal Grandfather   . Sleep apnea Maternal Grandfather   . Hypertension Sister   . Healthy Son   . Asthma Daughter   . Healthy Daughter    Past  Surgical History:  Procedure Laterality Date  . [place]; 2000  . MYOMECTOMY  1992   Social History   Social History Narrative   epworth sleepiness scale = 18 (08/11/15)    Objective: Vital Signs: BP 128/82 (BP Location: Left Arm, Patient Position: Sitting, Cuff Size: Normal)   Pulse 67   Resp 15   Ht 5' 6.5" (1.689 m)   Wt 212 lb (96.2 kg)   LMP 06/20/2013   BMI 33.71 kg/m    Physical Exam  Constitutional: She is oriented to person, place, and time. She appears well-developed and well-nourished.  HENT:  Head: Normocephalic and atraumatic.  Eyes: Conjunctivae and EOM are normal.  Neck: Normal range of motion.  Cardiovascular: Normal rate, regular rhythm, normal heart sounds and intact distal pulses.  Pulmonary/Chest: Effort normal and breath sounds normal.  Abdominal: Soft. Bowel sounds are normal.  Lymphadenopathy:    She has no cervical adenopathy.  Neurological: She is alert and oriented to person, place, and time.  Skin: Skin is warm and dry. Capillary refill takes less than 2 seconds.  Psychiatric: She has a normal mood and affect. Her behavior is normal.  Nursing note and vitals reviewed.    Musculoskeletal Exam: C-spine thoracic lumbar spine good range of motion.  Shoulder joints elbow joints wrist joint MCPs PIPs DIPs with good range of motion with no synovitis.  Hip joints knee joints ankles MTPs PIPs been good range of motion with no synovitis.  CDAI Exam: CDAI Score: Not documented Patient Global Assessment: Not documented; Provider Global Assessment: Not documented Swollen: Not documented; Tender: Not documented Joint Exam   Not documented   There is currently no information documented on the homunculus. Go to the Rheumatology activity and complete the homunculus joint exam.  Investigation: Findings:  December 29, 2017 AVISE ANA 1: 640 homogeneous, CB CAP E C4 D+ (dsDNA, Smith, RNP, SSA, SSB, SCL 70, Jo 1, anticardiolipin, beta-2 GP 1, CB CAP  BC 4d negative), RF negative, anti-CCP negative, antithyroglobulin negative, antithyroid peroxidase negative   Imaging: No results found.  Recent Labs: Lab Results  Component Value Date   WBC 10.5 09/20/2017   HGB 13.5 09/20/2017   PLT 358 09/20/2017   NA 139 09/20/2017   K 3.8 09/20/2017   CL 103 09/20/2017   CO2 25 09/20/2017   GLUCOSE 105 (H) 09/20/2017   BUN 12 09/20/2017   CREATININE 0.88 09/20/2017   BILITOT 0.7 10/31/2016   ALKPHOS 112 10/31/2016   AST 24 10/31/2016   ALT 25 10/31/2016   PROT 7.3 10/31/2016   ALBUMIN 4.0 10/31/2016   CALCIUM 8.8 (L) 09/20/2017   GFRAA >60 09/20/2017    Speciality Comments: No specialty comments available.  Procedures:  No procedures performed Allergies: Norvasc [amlodipine besylate]; Aspirin; Colchicine; Metoprolol; and Minoxidil   Assessment / Plan:     Visit Diagnoses: Idiopathic chronic gout of multiple sites without tophus - uric acid: 09/25/2017 10.5.  Patient was a started on allopurinol 100 mg p.o. daily  and to be  increased to 200 mg p.o. daily.  Patient states she was taking allopurinol until October and then she ran out of medication.  She was also taking meloxicam daily.  She could not tolerate colchicine as it caused diarrhea.  She does not want to take meloxicam anymore.  We had detailed discussion regarding increasing allopurinol gradually.  She will start with 100 mg p.o. daily for a month and then increase it to 200 mg p.o. daily for another month.  After that we can increase it to 300 mg p.o. daily.  She will also benefit from getting off some of the diuretics.  She states she has appointment coming up with cardiologist.  Dietary modifications were discussed at length.  Hyperuricemia  Positive ANA (antinuclear antibody) - AVISE index -0.5 ANA 1: 640 homogeneous, CB CAP positive.  The labs were discussed at length with her.  At this point she does not meet criteria for any autoimmune disease.  History of sleep apnea -   on CPAP  Essential hypertension-she is on medications per her cardiologist.  Medication monitoring encounter - Plan: COMPLETE METABOLIC PANEL WITH GFR, Uric acid, CBC with Differential/Platelet   PVCs (premature ventricular contractions)  Murmur  Other fatigue  GAD (generalized anxiety disorder)    Orders: Orders Placed This Encounter  Procedures  . COMPLETE METABOLIC PANEL WITH GFR  . Uric acid  . CBC with Differential/Platelet   Meds ordered this encounter  Medications  . allopurinol (ZYLOPRIM) 100 MG tablet    Sig: Take 1 tablet ('100mg'$ ) daily for 1 month and then increase to 2 tablets ('200mg'$ ) for 1 month.    Dispense:  90 tablet    Refill:  0    Face-to-face time spent with patient was 30 minutes. Greater than 50% of time was spent in counseling and coordination of care.  Follow-Up Instructions: Return in about 3 months (around 07/02/2018) for Gout, +ANA.   Bo Merino, MD  Note - This record has been created using Editor, commissioning.  Chart creation errors have been sought, but may not always  have been located. Such creation errors do not reflect on  the standard of medical care.

## 2018-03-24 DIAGNOSIS — G4733 Obstructive sleep apnea (adult) (pediatric): Secondary | ICD-10-CM | POA: Diagnosis not present

## 2018-03-26 ENCOUNTER — Other Ambulatory Visit: Payer: Self-pay | Admitting: Surgery

## 2018-03-26 DIAGNOSIS — N6312 Unspecified lump in the right breast, upper inner quadrant: Secondary | ICD-10-CM | POA: Diagnosis not present

## 2018-04-01 ENCOUNTER — Encounter: Payer: Self-pay | Admitting: Rheumatology

## 2018-04-01 ENCOUNTER — Ambulatory Visit: Payer: BLUE CROSS/BLUE SHIELD | Admitting: Rheumatology

## 2018-04-01 VITALS — BP 128/82 | HR 67 | Resp 15 | Ht 66.5 in | Wt 212.0 lb

## 2018-04-01 DIAGNOSIS — Z5181 Encounter for therapeutic drug level monitoring: Secondary | ICD-10-CM

## 2018-04-01 DIAGNOSIS — E790 Hyperuricemia without signs of inflammatory arthritis and tophaceous disease: Secondary | ICD-10-CM | POA: Diagnosis not present

## 2018-04-01 DIAGNOSIS — R768 Other specified abnormal immunological findings in serum: Secondary | ICD-10-CM | POA: Diagnosis not present

## 2018-04-01 DIAGNOSIS — M1A09X0 Idiopathic chronic gout, multiple sites, without tophus (tophi): Secondary | ICD-10-CM

## 2018-04-01 DIAGNOSIS — Z8669 Personal history of other diseases of the nervous system and sense organs: Secondary | ICD-10-CM | POA: Diagnosis not present

## 2018-04-01 DIAGNOSIS — I10 Essential (primary) hypertension: Secondary | ICD-10-CM

## 2018-04-01 MED ORDER — ALLOPURINOL 100 MG PO TABS: ORAL_TABLET | ORAL | 0 refills | Status: DC

## 2018-04-02 LAB — COMPLETE METABOLIC PANEL WITH GFR
AG Ratio: 1.3 (calc) (ref 1.0–2.5)
ALT: 25 U/L (ref 6–29)
AST: 24 U/L (ref 10–35)
Albumin: 4 g/dL (ref 3.6–5.1)
Alkaline phosphatase (APISO): 113 U/L (ref 33–130)
BUN: 11 mg/dL (ref 7–25)
CO2: 31 mmol/L (ref 20–32)
Calcium: 9.6 mg/dL (ref 8.6–10.4)
Chloride: 99 mmol/L (ref 98–110)
Creat: 0.84 mg/dL (ref 0.50–1.05)
GFR, Est African American: 89 mL/min/{1.73_m2} (ref >=60)
GFR, Est Non African American: 77 mL/min/{1.73_m2} (ref >=60)
Globulin: 3.2 g/dL (calc) (ref 1.9–3.7)
Glucose, Bld: 80 mg/dL (ref 65–99)
Potassium: 3.4 mmol/L — ABNORMAL LOW (ref 3.5–5.3)
Sodium: 138 mmol/L (ref 135–146)
Total Bilirubin: 0.5 mg/dL (ref 0.2–1.2)
Total Protein: 7.2 g/dL (ref 6.1–8.1)

## 2018-04-02 LAB — CBC WITH DIFFERENTIAL/PLATELET
Basophils Absolute: 72 cells/uL (ref 0–200)
Basophils Relative: 0.8 %
Eosinophils Absolute: 162 cells/uL (ref 15–500)
Eosinophils Relative: 1.8 %
HCT: 42.8 % (ref 35.0–45.0)
Hemoglobin: 14 g/dL (ref 11.7–15.5)
Lymphs Abs: 1890 cells/uL (ref 850–3900)
MCH: 26 pg — ABNORMAL LOW (ref 27.0–33.0)
MCHC: 32.7 g/dL (ref 32.0–36.0)
MCV: 79.6 fL — ABNORMAL LOW (ref 80.0–100.0)
MPV: 9.6 fL (ref 7.5–12.5)
Monocytes Relative: 7.7 %
Neutro Abs: 6183 cells/uL (ref 1500–7800)
Neutrophils Relative %: 68.7 %
Platelets: 367 10*3/uL (ref 140–400)
RBC: 5.38 10*6/uL — ABNORMAL HIGH (ref 3.80–5.10)
RDW: 13.6 % (ref 11.0–15.0)
Total Lymphocyte: 21 %
WBC mixed population: 693 cells/uL (ref 200–950)
WBC: 9 10*3/uL (ref 3.8–10.8)

## 2018-04-02 LAB — URIC ACID: Uric Acid, Serum: 8.4 mg/dL — ABNORMAL HIGH (ref 2.5–7.0)

## 2018-04-06 NOTE — Progress Notes (Signed)
Labs are consistent with gout.  Patient was placed on gradually increasing dose of allopurinol.  Her potassium is low.  Please forward labs to her PCP.

## 2018-04-08 DIAGNOSIS — Z23 Encounter for immunization: Secondary | ICD-10-CM | POA: Diagnosis not present

## 2018-04-08 DIAGNOSIS — N6310 Unspecified lump in the right breast, unspecified quadrant: Secondary | ICD-10-CM | POA: Diagnosis not present

## 2018-04-08 DIAGNOSIS — N63 Unspecified lump in unspecified breast: Secondary | ICD-10-CM | POA: Diagnosis not present

## 2018-04-21 DIAGNOSIS — F329 Major depressive disorder, single episode, unspecified: Secondary | ICD-10-CM | POA: Diagnosis not present

## 2018-04-21 DIAGNOSIS — D4861 Neoplasm of uncertain behavior of right breast: Secondary | ICD-10-CM | POA: Diagnosis not present

## 2018-04-21 DIAGNOSIS — Z79899 Other long term (current) drug therapy: Secondary | ICD-10-CM | POA: Diagnosis not present

## 2018-04-21 DIAGNOSIS — I10 Essential (primary) hypertension: Secondary | ICD-10-CM | POA: Diagnosis not present

## 2018-04-21 DIAGNOSIS — N6310 Unspecified lump in the right breast, unspecified quadrant: Secondary | ICD-10-CM | POA: Diagnosis not present

## 2018-04-21 DIAGNOSIS — N63 Unspecified lump in unspecified breast: Secondary | ICD-10-CM | POA: Diagnosis not present

## 2018-04-21 DIAGNOSIS — G4733 Obstructive sleep apnea (adult) (pediatric): Secondary | ICD-10-CM | POA: Diagnosis not present

## 2018-04-21 DIAGNOSIS — R609 Edema, unspecified: Secondary | ICD-10-CM | POA: Diagnosis not present

## 2018-04-21 DIAGNOSIS — F419 Anxiety disorder, unspecified: Secondary | ICD-10-CM | POA: Diagnosis not present

## 2018-04-21 DIAGNOSIS — Z9989 Dependence on other enabling machines and devices: Secondary | ICD-10-CM | POA: Diagnosis not present

## 2018-04-23 DIAGNOSIS — G4733 Obstructive sleep apnea (adult) (pediatric): Secondary | ICD-10-CM | POA: Diagnosis not present

## 2018-04-24 ENCOUNTER — Encounter (HOSPITAL_BASED_OUTPATIENT_CLINIC_OR_DEPARTMENT_OTHER): Payer: Self-pay

## 2018-04-24 ENCOUNTER — Ambulatory Visit (HOSPITAL_BASED_OUTPATIENT_CLINIC_OR_DEPARTMENT_OTHER): Admit: 2018-04-24 | Payer: BLUE CROSS/BLUE SHIELD | Admitting: Surgery

## 2018-04-24 SURGERY — BREAST LUMPECTOMY
Anesthesia: General | Laterality: Right

## 2018-04-30 ENCOUNTER — Other Ambulatory Visit: Payer: Self-pay | Admitting: Rheumatology

## 2018-04-30 NOTE — Telephone Encounter (Signed)
Patient states she is currently on the Allopurinol 100 mg and will be increasing to 200 mg for one month. Patient will check her prescription at home to make sure she has enough medication for increase and will contact the office if there is any concerns.

## 2018-04-30 NOTE — Telephone Encounter (Signed)
Last Visit: 04/01/18 Next visit: 07/02/18 Labs: 04/01/18 potassium is low 3.4  Left message for patient to call the office to verify dose of Allopurinol.

## 2018-05-01 ENCOUNTER — Other Ambulatory Visit: Payer: Self-pay | Admitting: Rheumatology

## 2018-05-04 ENCOUNTER — Telehealth: Payer: Self-pay | Admitting: Emergency Medicine

## 2018-05-04 NOTE — Telephone Encounter (Signed)
Copied from [place] [phone]. Topic: Referral - Request for Referral >> May 04, 2018  4:48 PM Blase Mess A wrote: Has patient seen PCP for this complaint? Yes  *If NO, is insurance requiring patient see PCP for this issue before PCP can refer them? Referral for which specialty: Hypertension [place] Preferred provider/office: [facility] Reason for referral: High Blood Pressure

## 2018-05-05 ENCOUNTER — Other Ambulatory Visit: Payer: Self-pay | Admitting: Emergency Medicine

## 2018-05-05 DIAGNOSIS — I10 Essential (primary) hypertension: Secondary | ICD-10-CM

## 2018-05-05 NOTE — Telephone Encounter (Signed)
Patient would like referral.

## 2018-05-05 NOTE — Telephone Encounter (Signed)
Referral sent 

## 2018-05-13 DIAGNOSIS — D242 Benign neoplasm of left breast: Secondary | ICD-10-CM | POA: Diagnosis not present

## 2018-05-13 DIAGNOSIS — Z886 Allergy status to analgesic agent status: Secondary | ICD-10-CM | POA: Diagnosis not present

## 2018-05-13 DIAGNOSIS — N6310 Unspecified lump in the right breast, unspecified quadrant: Secondary | ICD-10-CM | POA: Diagnosis not present

## 2018-05-13 DIAGNOSIS — Z9011 Acquired absence of right breast and nipple: Secondary | ICD-10-CM | POA: Diagnosis not present

## 2018-05-13 DIAGNOSIS — Z888 Allergy status to other drugs, medicaments and biological substances status: Secondary | ICD-10-CM | POA: Diagnosis not present

## 2018-05-13 DIAGNOSIS — D241 Benign neoplasm of right breast: Secondary | ICD-10-CM | POA: Diagnosis not present

## 2018-05-24 DIAGNOSIS — G4733 Obstructive sleep apnea (adult) (pediatric): Secondary | ICD-10-CM | POA: Diagnosis not present

## 2018-05-29 DIAGNOSIS — N6311 Unspecified lump in the right breast, upper outer quadrant: Secondary | ICD-10-CM | POA: Diagnosis not present

## 2018-05-29 DIAGNOSIS — N63 Unspecified lump in unspecified breast: Secondary | ICD-10-CM | POA: Diagnosis not present

## 2018-06-18 ENCOUNTER — Other Ambulatory Visit: Payer: Self-pay | Admitting: Internal Medicine

## 2018-06-19 NOTE — Progress Notes (Deleted)
Office Visit Note  Patient: Ariana Cohen             Date of Birth: 1960/06/30           MRN: [id number]             PCP: Horald Pollen, MD Referring: Horald Pollen, * Visit Date: 07/02/2018 Occupation: '@GUAROCC'$ @  Subjective:  No chief complaint on file.   History of Present Illness: Ariana Cohen is a 58 y.o. female ***   Activities of Daily Living:  Patient reports morning stiffness for *** {minute/hour:19697}.   Patient {ACTIONS;DENIES/REPORTS:21021675::"Denies"} nocturnal pain.  Difficulty dressing/grooming: {ACTIONS;DENIES/REPORTS:21021675::"Denies"} Difficulty climbing stairs: {ACTIONS;DENIES/REPORTS:21021675::"Denies"} Difficulty getting out of chair: {ACTIONS;DENIES/REPORTS:21021675::"Denies"} Difficulty using hands for taps, buttons, cutlery, and/or writing: {ACTIONS;DENIES/REPORTS:21021675::"Denies"}  No Rheumatology ROS completed.   PMFS History:  Patient Active Problem List   Diagnosis Date Noted  . History of sleep apnea 04/01/2018  . Positive ANA (antinuclear antibody) 03/23/2018  . Idiopathic chronic gout of multiple sites without tophus 02/27/2018  . Hyperuricemia 01/21/2018  . Rash 01/21/2018  . Great toe pain, left 10/07/2017  . Gout involving toe of left foot 08/25/2017  . Transient alteration of awareness 10/01/2016  . Memory loss 10/01/2016  . Obesity (BMI 30.0-34.9) 08/12/2016  . Onychomycosis 08/12/2016  . Acute reaction to situational stress 07/01/2016  . GAD (generalized anxiety disorder) 06/26/2016  . OSA (obstructive sleep apnea) 06/12/2016  . Snoring 06/11/2016  . Other fatigue 03/26/2016  . Medication management 11/06/2015  . Excessive daytime sleepiness 11/06/2015  . Murmur 08/11/2015  . PVCs (premature ventricular contractions) 08/11/2015  . Shortness of breath 08/11/2015  . Essential hypertension 08/11/2015    Past Medical History:  Diagnosis Date  . Anxiety   . Depression   . Fibroids   . Hypertension     . Urticaria     Family History  Problem Relation Age of Onset  . Hypertension Mother   . Renal Disease Mother   . Schizophrenia Mother   . Hypertension Father   . Heart disease Father        also OSA  . Hypertension Maternal Grandmother   . Heart attack Maternal Grandmother   . Thyroid disease Maternal Grandmother        thyroidectomy  . Hypertension Maternal Grandfather   . Multiple myeloma Maternal Grandfather   . Sleep apnea Maternal Grandfather   . Hypertension Sister   . Healthy Son   . Asthma Daughter   . Healthy Daughter    Past Surgical History:  Procedure Laterality Date  . [place]; 2000  . MYOMECTOMY  1992   Social History   Social History Narrative   epworth sleepiness scale = 18 (08/11/15)   Immunization History  Administered Date(s) Administered  . Influenza,inj,Quad PF,6+ Mos 06/26/2016, 12/30/2016     Objective: Vital Signs: LMP 06/20/2013    Physical Exam   Musculoskeletal Exam: ***  CDAI Exam: CDAI Score: Not documented Patient Global Assessment: Not documented; Provider Global Assessment: Not documented Swollen: Not documented; Tender: Not documented Joint Exam   Not documented   There is currently no information documented on the homunculus. Go to the Rheumatology activity and complete the homunculus joint exam.  Investigation: No additional findings.  Imaging: No results found.  Recent Labs: Lab Results  Component Value Date   WBC 9.0 04/01/2018   HGB 14.0 04/01/2018   PLT 367 04/01/2018   NA 138 04/01/2018   K 3.4 (L) 04/01/2018   CL  99 04/01/2018   CO2 31 04/01/2018   GLUCOSE 80 04/01/2018   BUN 11 04/01/2018   CREATININE 0.84 04/01/2018   BILITOT 0.5 04/01/2018   ALKPHOS 112 10/31/2016   AST 24 04/01/2018   ALT 25 04/01/2018   PROT 7.2 04/01/2018   ALBUMIN 4.0 10/31/2016   CALCIUM 9.6 04/01/2018   GFRAA 89 04/01/2018    Speciality Comments: No specialty comments available.  Procedures:  No  procedures performed Allergies: Norvasc [amlodipine besylate]; Aspirin; Colchicine; Metoprolol; and Minoxidil   Assessment / Plan:     Visit Diagnoses: Idiopathic chronic gout of multiple sites without tophus - Allopurinol 300 mg po dailyd/c colchicine-diarrhea  Hyperuricemia  Medication monitoring encounter  Positive ANA (antinuclear antibody) -  AVISE index -0.5 ANA 1: 640 homogeneous, CB CAP positive  Essential hypertension  PVCs (premature ventricular contractions)  Murmur  Other fatigue  GAD (generalized anxiety disorder)  History of sleep apnea   Orders: No orders of the defined types were placed in this encounter.  No orders of the defined types were placed in this encounter.   Face-to-face time spent with patient was *** minutes. Greater than 50% of time was spent in counseling and coordination of care.  Follow-Up Instructions: No follow-ups on file.   Ofilia Neas, PA-C  Note - This record has been created using Dragon software.  Chart creation errors have been sought, but may not always  have been located. Such creation errors do not reflect on  the standard of medical care.

## 2018-06-24 DIAGNOSIS — G4733 Obstructive sleep apnea (adult) (pediatric): Secondary | ICD-10-CM | POA: Diagnosis not present

## 2018-06-30 DIAGNOSIS — I10 Essential (primary) hypertension: Secondary | ICD-10-CM | POA: Diagnosis not present

## 2018-06-30 DIAGNOSIS — M109 Gout, unspecified: Secondary | ICD-10-CM | POA: Diagnosis not present

## 2018-06-30 DIAGNOSIS — R011 Cardiac murmur, unspecified: Secondary | ICD-10-CM | POA: Diagnosis not present

## 2018-06-30 DIAGNOSIS — R9431 Abnormal electrocardiogram [ECG] [EKG]: Secondary | ICD-10-CM | POA: Diagnosis not present

## 2018-06-30 DIAGNOSIS — Z79899 Other long term (current) drug therapy: Secondary | ICD-10-CM | POA: Diagnosis not present

## 2018-07-02 ENCOUNTER — Ambulatory Visit: Payer: BLUE CROSS/BLUE SHIELD | Admitting: Rheumatology

## 2018-07-23 DIAGNOSIS — G4733 Obstructive sleep apnea (adult) (pediatric): Secondary | ICD-10-CM | POA: Diagnosis not present

## 2018-07-27 DIAGNOSIS — I10 Essential (primary) hypertension: Secondary | ICD-10-CM | POA: Diagnosis not present

## 2018-07-27 DIAGNOSIS — M109 Gout, unspecified: Secondary | ICD-10-CM | POA: Diagnosis not present

## 2018-07-30 ENCOUNTER — Other Ambulatory Visit: Payer: Self-pay | Admitting: Internal Medicine

## 2018-07-30 MED ORDER — HYDRALAZINE HCL 25 MG PO TABS: 25.0000 mg | ORAL_TABLET | Freq: Two times a day (BID) | ORAL | 1 refills | Status: DC

## 2018-08-17 DIAGNOSIS — G4733 Obstructive sleep apnea (adult) (pediatric): Secondary | ICD-10-CM | POA: Diagnosis not present

## 2018-08-17 DIAGNOSIS — I10 Essential (primary) hypertension: Secondary | ICD-10-CM | POA: Diagnosis not present

## 2018-08-17 DIAGNOSIS — R011 Cardiac murmur, unspecified: Secondary | ICD-10-CM | POA: Diagnosis not present

## 2018-08-17 DIAGNOSIS — M109 Gout, unspecified: Secondary | ICD-10-CM | POA: Diagnosis not present

## 2018-08-23 DIAGNOSIS — G4733 Obstructive sleep apnea (adult) (pediatric): Secondary | ICD-10-CM | POA: Diagnosis not present

## 2018-08-24 ENCOUNTER — Telehealth: Payer: Self-pay | Admitting: *Deleted

## 2018-08-24 NOTE — Telephone Encounter (Signed)
08/24/2018  LMOM @ 12:16 pm,re:appointment.  D.Marissah Vandemark

## 2018-09-09 DIAGNOSIS — M1A0720 Idiopathic chronic gout, left ankle and foot, without tophus (tophi): Secondary | ICD-10-CM | POA: Diagnosis not present

## 2018-09-10 ENCOUNTER — Other Ambulatory Visit: Payer: Self-pay

## 2018-09-10 MED ORDER — SPIRONOLACTONE 25 MG PO TABS: 25.0000 mg | ORAL_TABLET | Freq: Every day | ORAL | 1 refills | Status: DC

## 2018-09-19 ENCOUNTER — Other Ambulatory Visit: Payer: Self-pay | Admitting: Internal Medicine

## 2018-09-21 DIAGNOSIS — M109 Gout, unspecified: Secondary | ICD-10-CM | POA: Diagnosis not present

## 2018-09-21 DIAGNOSIS — R011 Cardiac murmur, unspecified: Secondary | ICD-10-CM | POA: Diagnosis not present

## 2018-09-21 DIAGNOSIS — I10 Essential (primary) hypertension: Secondary | ICD-10-CM | POA: Diagnosis not present

## 2018-09-21 NOTE — Telephone Encounter (Signed)
Generic Micardis refilled.

## 2018-09-22 DIAGNOSIS — G4733 Obstructive sleep apnea (adult) (pediatric): Secondary | ICD-10-CM | POA: Diagnosis not present

## 2018-09-25 DIAGNOSIS — I10 Essential (primary) hypertension: Secondary | ICD-10-CM | POA: Diagnosis not present

## 2018-09-25 DIAGNOSIS — M1A0720 Idiopathic chronic gout, left ankle and foot, without tophus (tophi): Secondary | ICD-10-CM | POA: Diagnosis not present

## 2018-10-23 DIAGNOSIS — G4733 Obstructive sleep apnea (adult) (pediatric): Secondary | ICD-10-CM | POA: Diagnosis not present

## 2018-11-22 DIAGNOSIS — G4733 Obstructive sleep apnea (adult) (pediatric): Secondary | ICD-10-CM | POA: Diagnosis not present

## 2018-11-27 DIAGNOSIS — Z9889 Other specified postprocedural states: Secondary | ICD-10-CM | POA: Diagnosis not present

## 2018-11-27 DIAGNOSIS — D241 Benign neoplasm of right breast: Secondary | ICD-10-CM | POA: Diagnosis not present

## 2018-11-27 DIAGNOSIS — Z08 Encounter for follow-up examination after completed treatment for malignant neoplasm: Secondary | ICD-10-CM | POA: Diagnosis not present

## 2018-11-27 DIAGNOSIS — Z853 Personal history of malignant neoplasm of breast: Secondary | ICD-10-CM | POA: Diagnosis not present

## 2018-11-30 DIAGNOSIS — I5189 Other ill-defined heart diseases: Secondary | ICD-10-CM | POA: Diagnosis not present

## 2018-11-30 DIAGNOSIS — R011 Cardiac murmur, unspecified: Secondary | ICD-10-CM | POA: Diagnosis not present

## 2018-11-30 DIAGNOSIS — I10 Essential (primary) hypertension: Secondary | ICD-10-CM | POA: Diagnosis not present

## 2018-11-30 DIAGNOSIS — M79604 Pain in right leg: Secondary | ICD-10-CM | POA: Diagnosis not present

## 2018-11-30 DIAGNOSIS — I110 Hypertensive heart disease with heart failure: Secondary | ICD-10-CM | POA: Diagnosis not present

## 2018-11-30 DIAGNOSIS — M79605 Pain in left leg: Secondary | ICD-10-CM | POA: Diagnosis not present

## 2018-12-23 DIAGNOSIS — G4733 Obstructive sleep apnea (adult) (pediatric): Secondary | ICD-10-CM | POA: Diagnosis not present

## 2019-01-09 ENCOUNTER — Other Ambulatory Visit: Payer: Self-pay | Admitting: Internal Medicine

## 2019-01-14 ENCOUNTER — Telehealth: Payer: Self-pay | Admitting: Emergency Medicine

## 2019-01-14 DIAGNOSIS — Z1211 Encounter for screening for malignant neoplasm of colon: Secondary | ICD-10-CM

## 2019-01-14 NOTE — Telephone Encounter (Signed)
Copied from [place] [id number]. Topic: General - Other >> Jan 13, 2019  3:26 PM Celene Kras A wrote: Reason for CRM: Pt called and is requesting a referral for a colonoscopy. Please advise.

## 2019-01-20 NOTE — Telephone Encounter (Signed)
Referral sent. Dgaddy, cma

## 2019-01-20 NOTE — Telephone Encounter (Signed)
Please refer her for a colonoscopy.  Thanks.

## 2019-01-20 NOTE — Telephone Encounter (Signed)
Please advise. Dgaddy, CMA 

## 2019-02-18 DIAGNOSIS — Z113 Encounter for screening for infections with a predominantly sexual mode of transmission: Secondary | ICD-10-CM | POA: Diagnosis not present

## 2019-02-18 DIAGNOSIS — N841 Polyp of cervix uteri: Secondary | ICD-10-CM | POA: Diagnosis not present

## 2019-02-18 DIAGNOSIS — N888 Other specified noninflammatory disorders of cervix uteri: Secondary | ICD-10-CM | POA: Diagnosis not present

## 2019-02-18 DIAGNOSIS — N950 Postmenopausal bleeding: Secondary | ICD-10-CM | POA: Diagnosis not present

## 2019-03-03 ENCOUNTER — Other Ambulatory Visit: Payer: Self-pay | Admitting: Obstetrics & Gynecology

## 2019-03-03 DIAGNOSIS — D252 Subserosal leiomyoma of uterus: Secondary | ICD-10-CM | POA: Diagnosis not present

## 2019-03-03 DIAGNOSIS — N841 Polyp of cervix uteri: Secondary | ICD-10-CM | POA: Diagnosis not present

## 2019-03-03 DIAGNOSIS — N950 Postmenopausal bleeding: Secondary | ICD-10-CM | POA: Diagnosis not present

## 2019-03-03 DIAGNOSIS — D251 Intramural leiomyoma of uterus: Secondary | ICD-10-CM | POA: Diagnosis not present

## 2019-03-03 DIAGNOSIS — N858 Other specified noninflammatory disorders of uterus: Secondary | ICD-10-CM | POA: Diagnosis not present

## 2019-03-24 DIAGNOSIS — Z6834 Body mass index (BMI) 34.0-34.9, adult: Secondary | ICD-10-CM | POA: Diagnosis not present

## 2019-03-24 DIAGNOSIS — Z01419 Encounter for gynecological examination (general) (routine) without abnormal findings: Secondary | ICD-10-CM | POA: Diagnosis not present

## 2019-03-24 DIAGNOSIS — Z1151 Encounter for screening for human papillomavirus (HPV): Secondary | ICD-10-CM | POA: Diagnosis not present

## 2019-05-14 DIAGNOSIS — Z20822 Contact with and (suspected) exposure to covid-19: Secondary | ICD-10-CM | POA: Diagnosis not present

## 2019-05-17 DIAGNOSIS — Z1211 Encounter for screening for malignant neoplasm of colon: Secondary | ICD-10-CM | POA: Diagnosis not present

## 2019-05-20 DIAGNOSIS — G4733 Obstructive sleep apnea (adult) (pediatric): Secondary | ICD-10-CM | POA: Diagnosis not present

## 2019-05-31 DIAGNOSIS — I10 Essential (primary) hypertension: Secondary | ICD-10-CM | POA: Diagnosis not present

## 2019-06-04 DIAGNOSIS — D241 Benign neoplasm of right breast: Secondary | ICD-10-CM | POA: Diagnosis not present

## 2019-06-04 DIAGNOSIS — Z9011 Acquired absence of right breast and nipple: Secondary | ICD-10-CM | POA: Diagnosis not present

## 2019-08-06 DIAGNOSIS — G243 Spasmodic torticollis: Secondary | ICD-10-CM | POA: Diagnosis not present

## 2019-08-11 ENCOUNTER — Encounter: Payer: Self-pay | Admitting: General Practice

## 2019-11-20 DIAGNOSIS — B9789 Other viral agents as the cause of diseases classified elsewhere: Secondary | ICD-10-CM | POA: Diagnosis not present

## 2019-11-20 DIAGNOSIS — J329 Chronic sinusitis, unspecified: Secondary | ICD-10-CM | POA: Diagnosis not present

## 2019-11-20 DIAGNOSIS — Z20822 Contact with and (suspected) exposure to covid-19: Secondary | ICD-10-CM | POA: Diagnosis not present

## 2019-11-20 DIAGNOSIS — R05 Cough: Secondary | ICD-10-CM | POA: Diagnosis not present

## 2019-11-21 DIAGNOSIS — R05 Cough: Secondary | ICD-10-CM | POA: Diagnosis not present

## 2019-11-21 DIAGNOSIS — B9789 Other viral agents as the cause of diseases classified elsewhere: Secondary | ICD-10-CM | POA: Diagnosis not present

## 2019-11-21 DIAGNOSIS — J329 Chronic sinusitis, unspecified: Secondary | ICD-10-CM | POA: Diagnosis not present

## 2019-11-21 DIAGNOSIS — Z20822 Contact with and (suspected) exposure to covid-19: Secondary | ICD-10-CM | POA: Diagnosis not present

## 2020-01-26 ENCOUNTER — Telehealth: Payer: Self-pay

## 2020-01-26 NOTE — Telephone Encounter (Signed)
ok 

## 2020-01-26 NOTE — Telephone Encounter (Signed)
New patient request, she stated her daughter is a pt of yours Elianie Hubers) and she is wanting to establish care with you.  I advised her you are not actively taking on new patients, but I would forward request to you and get back to her with your answer.

## 2020-02-15 DIAGNOSIS — Z79899 Other long term (current) drug therapy: Secondary | ICD-10-CM | POA: Diagnosis not present

## 2020-02-15 DIAGNOSIS — Z713 Dietary counseling and surveillance: Secondary | ICD-10-CM | POA: Diagnosis not present

## 2020-02-15 DIAGNOSIS — M109 Gout, unspecified: Secondary | ICD-10-CM | POA: Diagnosis not present

## 2020-02-15 DIAGNOSIS — I10 Essential (primary) hypertension: Secondary | ICD-10-CM | POA: Diagnosis not present

## 2020-02-15 DIAGNOSIS — R011 Cardiac murmur, unspecified: Secondary | ICD-10-CM | POA: Diagnosis not present

## 2020-02-21 ENCOUNTER — Ambulatory Visit: Payer: BC Managed Care – PPO | Admitting: Family Medicine

## 2020-03-02 ENCOUNTER — Ambulatory Visit: Payer: BC Managed Care – PPO | Admitting: Family Medicine

## 2020-03-02 ENCOUNTER — Other Ambulatory Visit: Payer: Self-pay

## 2020-03-02 ENCOUNTER — Encounter: Payer: Self-pay | Admitting: Family Medicine

## 2020-03-02 ENCOUNTER — Other Ambulatory Visit (HOSPITAL_COMMUNITY)
Admission: RE | Admit: 2020-03-02 | Discharge: 2020-03-02 | Disposition: A | Discharge status: Home / Self Care | Payer: BC Managed Care – PPO | Source: Ambulatory Visit | Attending: Family Medicine | Admitting: Family Medicine

## 2020-03-02 VITALS — BP 110/70 | HR 65 | Temp 98.1°F | Resp 18 | Ht 66.5 in | Wt 209.6 lb

## 2020-03-02 DIAGNOSIS — N898 Other specified noninflammatory disorders of vagina: Secondary | ICD-10-CM | POA: Diagnosis not present

## 2020-03-02 DIAGNOSIS — Z8669 Personal history of other diseases of the nervous system and sense organs: Secondary | ICD-10-CM

## 2020-03-02 DIAGNOSIS — Z23 Encounter for immunization: Secondary | ICD-10-CM | POA: Diagnosis not present

## 2020-03-02 DIAGNOSIS — R3129 Other microscopic hematuria: Secondary | ICD-10-CM

## 2020-03-02 DIAGNOSIS — I10 Essential (primary) hypertension: Secondary | ICD-10-CM

## 2020-03-02 LAB — POC URINALSYSI DIPSTICK (AUTOMATED)
Bilirubin, UA: NEGATIVE
Glucose, UA: NEGATIVE
Ketones, UA: NEGATIVE
Leukocytes, UA: NEGATIVE
Nitrite, UA: NEGATIVE
Protein, UA: POSITIVE — AB
Spec Grav, UA: 1.02 (ref 1.010–1.025)
Urobilinogen, UA: 1 E.U./dL
pH, UA: 6.5 (ref 5.0–8.0)

## 2020-03-02 MED ORDER — FLUCONAZOLE 150 MG PO TABS: ORAL_TABLET | ORAL | 0 refills | Status: DC

## 2020-03-02 NOTE — Patient Instructions (Signed)
Vaginitis Vaginitis is a condition in which the vaginal tissue swells and becomes red (inflamed). This condition is most often caused by a change in the normal balance of bacteria and yeast that live in the vagina. This change causes an overgrowth of certain bacteria or yeast, which causes the inflammation. There are different types of vaginitis, but the most common types are:  Bacterial vaginosis.  Yeast infection (candidiasis).  Trichomoniasis vaginitis. This is a sexually transmitted disease (STD).  Viral vaginitis.  Atrophic vaginitis.  Allergic vaginitis. What are the causes? The cause of this condition depends on the type of vaginitis. It can be caused by:  Bacteria (bacterial vaginosis).  Yeast, which is a fungus (yeast infection).  A parasite (trichomoniasis vaginitis).  A virus (viral vaginitis).  Low hormone levels (atrophic vaginitis). Low hormone levels can occur during pregnancy, breastfeeding, or after menopause.  Irritants, such as bubble baths, scented tampons, and feminine sprays (allergic vaginitis). Other factors can change the normal balance of the yeast and bacteria that live in the vagina. These include:  Antibiotic medicines.  Poor hygiene.  Diaphragms, vaginal sponges, spermicides, birth control pills, and intrauterine devices (IUD).  Sex.  Infection.  Uncontrolled diabetes.  A weakened defense (immune) system. What increases the risk? This condition is more likely to develop in women who:  Smoke.  Use vaginal douches, scented tampons, or scented sanitary pads.  Wear tight-fitting pants.  Wear thong underwear.  Use oral birth control pills or an IUD.  Have sex without a condom.  Have multiple sex partners.  Have an STD.  Frequently use the spermicide nonoxynol-9.  Eat lots of foods high in sugar.  Have uncontrolled diabetes.  Have low estrogen levels.  Have a weakened immune system from an immune disorder or medical  treatment.  Are pregnant or breastfeeding. What are the signs or symptoms? Symptoms vary depending on the cause of the vaginitis. Common symptoms include:  Abnormal vaginal discharge. ? The discharge is white, gray, or yellow with bacterial vaginosis. ? The discharge is thick, white, and cheesy with a yeast infection. ? The discharge is frothy and yellow or greenish with trichomoniasis.  A bad vaginal smell. The smell is fishy with bacterial vaginosis.  Vaginal itching, pain, or swelling.  Sex that is painful.  Pain or burning when urinating. Sometimes there are no symptoms. How is this diagnosed? This condition is diagnosed based on your symptoms and medical history. A physical exam, including a pelvic exam, will also be done. You may also have other tests, including:  Tests to determine the pH level (acidity or alkalinity) of your vagina.  A whiff test, to assess the odor that results when a sample of your vaginal discharge is mixed with a potassium hydroxide solution.  Tests of vaginal fluid. A sample will be examined under a microscope. How is this treated? Treatment varies depending on the type of vaginitis you have. Your treatment may include:  Antibiotic creams or pills to treat bacterial vaginosis and trichomoniasis.  Antifungal medicines, such as vaginal creams or suppositories, to treat a yeast infection.  Medicine to ease discomfort if you have viral vaginitis. Your sexual partner should also be treated.  Estrogen delivered in a cream, pill, suppository, or vaginal ring to treat atrophic vaginitis. If vaginal dryness occurs, lubricants and moisturizing creams may help. You may need to avoid scented soaps, sprays, or douches.  Stopping use of a product that is causing allergic vaginitis. Then using a vaginal cream to treat the symptoms. Follow   these instructions at home: Lifestyle  Keep your genital area clean and dry. Avoid soap, and only rinse the area with  water.  Do not douche or use tampons until your health care provider says it is okay to do so. Use sanitary pads, if needed.  Do not have sex until your health care provider approves. When you can return to sex, practice safe sex and use condoms.  Wipe from front to back. This avoids the spread of bacteria from the rectum to the vagina. General instructions  Take over-the-counter and prescription medicines only as told by your health care provider.  If you were prescribed an antibiotic medicine, take or use it as told by your health care provider. Do not stop taking or using the antibiotic even if you start to feel better.  Keep all follow-up visits as told by your health care provider. This is important. How is this prevented?  Use mild, non-scented products. Do not use things that can irritate the vagina, such as fabric softeners. Avoid the following products if they are scented: ? Feminine sprays. ? Detergents. ? Tampons. ? Feminine hygiene products. ? Soaps or bubble baths.  Let air reach your genital area. ? Wear cotton underwear to reduce moisture buildup. ? Avoid wearing underwear while you sleep. ? Avoid wearing tight pants and underwear or nylons without a cotton panel. ? Avoid wearing thong underwear.  Take off any wet clothing, such as bathing suits, as soon as possible.  Practice safe sex and use condoms. Contact a health care provider if:  You have abdominal pain.  You have a fever.  You have symptoms that last for more than 2-3 days. Get help right away if:  You have a fever and your symptoms suddenly get worse. Summary  Vaginitis is a condition in which the vaginal tissue becomes inflamed.This condition is most often caused by a change in the normal balance of bacteria and yeast that live in the vagina.  Treatment varies depending on the type of vaginitis you have.  Do not douche, use tampons , or have sex until your health care provider approves. When  you can return to sex, practice safe sex and use condoms. This information is not intended to replace advice given to you by your health care provider. Make sure you discuss any questions you have with your health care provider. Document Revised: 04/04/2017 Document Reviewed: 05/28/2016 Elsevier Patient Education  2020 [employer] Inc[employer]  

## 2020-03-02 NOTE — Progress Notes (Signed)
Patient ID: Ariana Cohen, female    DOB: 1961/01/22  Age: 59 y.o. MRN: [id number]    Subjective:  Subjective  HPI Ariana Cohen presents to establish care ---- she c/o yeast infection    + vaginal itching but no odor or d/c    No abd pain   Review of Systems  Constitutional: Negative for appetite change, diaphoresis, fatigue and unexpected weight change.  Eyes: Negative for pain, redness and visual disturbance.  Respiratory: Negative for cough, chest tightness, shortness of breath and wheezing.   Cardiovascular: Negative for chest pain, palpitations and leg swelling.  Endocrine: Negative for cold intolerance, heat intolerance, polydipsia, polyphagia and polyuria.  Genitourinary: Negative for difficulty urinating, dysuria and frequency.       + vaginal itchin  Neurological: Negative for dizziness, light-headedness, numbness and headaches.    History Past Medical History:  Diagnosis Date  . Anxiety   . Depression   . Fibroids   . Hypertension   . Urticaria     She has a past surgical history that includes Myomectomy (1992); Cesarean section (1998; 2000); and Breast biopsy (2019).   Her family history includes Asthma in her daughter; Healthy in her daughter and son; Heart attack in her maternal grandmother; Heart disease in her father; Hypertension in her father, maternal grandfather, maternal grandmother, mother, and sister; Multiple myeloma in her maternal grandfather; Renal Disease in her mother; Schizophrenia in her mother; Sleep apnea in her maternal grandfather; Thyroid disease in her maternal grandmother.She reports that she has never smoked. She has never used smokeless tobacco. She reports that she does not drink alcohol and does not use drugs.  Current Outpatient Medications on File Prior to Visit  Medication Sig Dispense Refill  . allopurinol (ZYLOPRIM) 100 MG tablet Take 1 tablet ($RemoveB'100mg'VQdDiVaq$ ) daily for 1 month and then increase to 2 tablets ($RemoveBe'200mg'ONBjnmFIQ$ ) for 1 month. 90 tablet 0    . cetirizine (ZYRTEC) 10 MG tablet Take 10 mg by mouth as needed.     Marland Kitchen spironolactone (ALDACTONE) 25 MG tablet Take 1 tablet (25 mg total) by mouth daily. 90 tablet 1  . telmisartan-hydrochlorothiazide (MICARDIS HCT) 80-25 MG tablet TAKE 1 TABLET BY MOUTH EVERY DAY 90 tablet 0   No current facility-administered medications on file prior to visit.     Objective:  Objective  Physical Exam Vitals and nursing note reviewed.  Constitutional:      Appearance: She is well-developed.  HENT:     Head: Normocephalic and atraumatic.  Eyes:     Conjunctiva/sclera: Conjunctivae normal.  Neck:     Thyroid: No thyromegaly.     Vascular: No carotid bruit or JVD.  Cardiovascular:     Rate and Rhythm: Normal rate and regular rhythm.     Heart sounds: Normal heart sounds. No murmur heard.   Pulmonary:     Effort: Pulmonary effort is normal. No respiratory distress.     Breath sounds: Normal breath sounds. No wheezing or rales.  Chest:     Chest wall: No tenderness.  Abdominal:     General: There is no distension.     Tenderness: There is no abdominal tenderness. There is no right CVA tenderness, left CVA tenderness, guarding or rebound.  Musculoskeletal:     Cervical back: Normal range of motion and neck supple.  Neurological:     Mental Status: She is alert and oriented to person, place, and time.    BP 110/70 (BP Location: Left Arm, Patient Position: Sitting, Cuff Size:  Large)   Pulse 65   Temp 98.1 F (36.7 C) (Oral)   Resp 18   Ht 5' 6.5" (1.689 m)   Wt 209 lb 9.6 oz (95.1 kg)   LMP 06/20/2013   SpO2 97%   BMI 33.32 kg/m  Wt Readings from Last 3 Encounters:  03/02/20 209 lb 9.6 oz (95.1 kg)  04/01/18 212 lb (96.2 kg)  11/19/17 226 lb (102.5 kg)     Lab Results  Component Value Date   WBC 9.0 04/01/2018   HGB 14.0 04/01/2018   HCT 42.8 04/01/2018   PLT 367 04/01/2018   GLUCOSE 80 04/01/2018   ALT 25 04/01/2018   AST 24 04/01/2018   NA 138 04/01/2018   K 3.4 (L)  04/01/2018   CL 99 04/01/2018   CREATININE 0.84 04/01/2018   BUN 11 04/01/2018   CO2 31 04/01/2018   TSH 0.64 10/31/2016   HGBA1C 6.1 (H) 09/20/2017    No results found.   Assessment & Plan:  Plan  I have discontinued Ariana Cohen. Ariana Cohen's nebivolol, ibuprofen, hydrALAZINE, predniSONE, Bystolic, Colchicine, meloxicam, and hydrALAZINE. I am also having her start on fluconazole. Additionally, I am having her maintain her cetirizine, allopurinol, spironolactone, and telmisartan-hydrochlorothiazide.  Meds ordered this encounter  Medications  . fluconazole (DIFLUCAN) 150 MG tablet    Sig: 1 po x1, may repeat in 3 days prn    Dispense:  2 tablet    Refill:  0    Problem List Items Addressed This Visit      Unprioritized   Essential hypertension    Well controlled, no changes to meds. Encouraged heart healthy diet such as the DASH diet and exercise as tolerated.  con't micardis/ hct and spironolactone       History of sleep apnea    Per cardiology      Vaginal itching - Primary    Self swab done  Diflucan per orders cevicovaginal ancillary done        Relevant Medications   fluconazole (DIFLUCAN) 150 MG tablet   Other Relevant Orders   POCT Urinalysis Dipstick (Automated) (Completed)   Cervicovaginal ancillary only( [place])    Other Visit Diagnoses    Need for influenza vaccination       Relevant Orders   Flu Vaccine QUAD 36+ mos IM   Morbid obesity ([facility])   (Active)     Relevant Orders   Amb Ref to Medical Weight Management   Microscopic hematuria       Relevant Orders   Urine Culture (Completed)      Follow-up: Return in about 4 months (around 07/03/2020), or if symptoms worsen or fail to improve, for hypertension, hyperlipidemia, hyperglycemia.  Ann Held, DO

## 2020-03-03 LAB — URINE CULTURE
MICRO NUMBER:: 11131345
SPECIMEN QUALITY:: ADEQUATE

## 2020-03-05 DIAGNOSIS — N898 Other specified noninflammatory disorders of vagina: Secondary | ICD-10-CM | POA: Insufficient documentation

## 2020-03-05 NOTE — Assessment & Plan Note (Signed)
Self swab done  Diflucan per orders cevicovaginal ancillary done

## 2020-03-05 NOTE — Assessment & Plan Note (Signed)
Well controlled, no changes to meds. Encouraged heart healthy diet such as the DASH diet and exercise as tolerated.  con't micardis/ hct and spironolactone

## 2020-03-05 NOTE — Assessment & Plan Note (Signed)
Per cardiology 

## 2020-03-06 ENCOUNTER — Encounter: Payer: Self-pay | Admitting: Family Medicine

## 2020-03-06 LAB — CERVICOVAGINAL ANCILLARY ONLY
Bacterial Vaginitis (gardnerella): NEGATIVE
Candida Glabrata: NEGATIVE
Candida Vaginitis: NEGATIVE
Chlamydia: NEGATIVE
Comment: NEGATIVE
Comment: NEGATIVE
Comment: NEGATIVE
Comment: NEGATIVE
Comment: NEGATIVE
Comment: NORMAL
Neisseria Gonorrhea: NEGATIVE
Trichomonas: NEGATIVE

## 2020-03-07 NOTE — Telephone Encounter (Signed)
Negative nuswab

## 2020-03-07 NOTE — Telephone Encounter (Signed)
It may just be external yeast ----  Use monistat or we can call in nystatin cream to pharmacy  If no improvement --- will need pelvic exam either here or with gyn

## 2020-03-08 ENCOUNTER — Encounter (INDEPENDENT_AMBULATORY_CARE_PROVIDER_SITE_OTHER): Payer: Self-pay

## 2020-03-20 NOTE — Telephone Encounter (Signed)
Urine culture is contaminated --- so if she is still having symptoms we can repeat  May need pelvic for swab

## 2020-04-04 ENCOUNTER — Ambulatory Visit: Payer: BC Managed Care – PPO | Admitting: Family Medicine

## 2020-04-11 ENCOUNTER — Encounter: Payer: Self-pay | Admitting: Family Medicine

## 2020-04-11 ENCOUNTER — Ambulatory Visit: Payer: BC Managed Care – PPO | Admitting: Family Medicine

## 2020-04-11 ENCOUNTER — Other Ambulatory Visit: Payer: Self-pay

## 2020-04-11 VITALS — BP 124/60 | HR 70 | Temp 97.5°F | Resp 18 | Ht 66.5 in | Wt 209.0 lb

## 2020-04-11 DIAGNOSIS — N760 Acute vaginitis: Secondary | ICD-10-CM | POA: Diagnosis not present

## 2020-04-11 DIAGNOSIS — Z1211 Encounter for screening for malignant neoplasm of colon: Secondary | ICD-10-CM | POA: Diagnosis not present

## 2020-04-11 LAB — POC URINALSYSI DIPSTICK (AUTOMATED)
Bilirubin, UA: NEGATIVE
Blood, UA: NEGATIVE
Glucose, UA: NEGATIVE
Ketones, UA: NEGATIVE
Leukocytes, UA: NEGATIVE
Nitrite, UA: NEGATIVE
Protein, UA: NEGATIVE
Spec Grav, UA: 1.015 (ref 1.010–1.025)
Urobilinogen, UA: 0.2 E.U./dL
pH, UA: 8 (ref 5.0–8.0)

## 2020-04-11 MED ORDER — TERCONAZOLE 0.4 % VA CREA: 1.0000 | TOPICAL_CREAM | Freq: Every day | VAGINAL | 0 refills | Status: DC

## 2020-04-11 NOTE — Progress Notes (Signed)
Patient ID: Ariana Cohen, female    DOB: 06-27-1960  Age: 59 y.o. MRN: [id number]    Subjective:  Subjective  HPI Ariana Cohen presents for con't vaginal itching , no d/c or odor She is also requesting an order for colonoscopy No other complaints   Review of Systems  Constitutional: Negative for appetite change, diaphoresis, fatigue and unexpected weight change.  Eyes: Negative for pain, redness and visual disturbance.  Respiratory: Negative for cough, chest tightness, shortness of breath and wheezing.   Cardiovascular: Negative for chest pain, palpitations and leg swelling.  Endocrine: Negative for cold intolerance, heat intolerance, polydipsia, polyphagia and polyuria.  Genitourinary: Negative for difficulty urinating, dysuria and frequency.  Neurological: Negative for dizziness, light-headedness, numbness and headaches.    History Past Medical History:  Diagnosis Date  . Anxiety   . Depression   . Fibroids   . Hypertension   . Urticaria     She has a past surgical history that includes Myomectomy (1992); Cesarean section (1998; 2000); and Breast biopsy (2019).   Her family history includes Asthma in her daughter; Healthy in her daughter and son; Heart attack in her maternal grandmother; Heart disease in her father; Hypertension in her father, maternal grandfather, maternal grandmother, mother, and sister; Multiple myeloma in her maternal grandfather; Renal Disease in her mother; Schizophrenia in her mother; Sleep apnea in her maternal grandfather; Thyroid disease in her maternal grandmother.She reports that she has never smoked. She has never used smokeless tobacco. She reports that she does not drink alcohol and does not use drugs.  Current Outpatient Medications on File Prior to Visit  Medication Sig Dispense Refill  . allopurinol (ZYLOPRIM) 100 MG tablet Take 1 tablet (126m) daily for 1 month and then increase to 2 tablets (2078m for 1 month. 90 tablet 0  .  cetirizine (ZYRTEC) 10 MG tablet Take 10 mg by mouth as needed.     . Marland Kitchenpironolactone (ALDACTONE) 25 MG tablet Take 1 tablet (25 mg total) by mouth daily. 90 tablet 1  . telmisartan-hydrochlorothiazide (MICARDIS HCT) 80-25 MG tablet TAKE 1 TABLET BY MOUTH EVERY DAY 90 tablet 0  . fluconazole (DIFLUCAN) 150 MG tablet 1 po x1, may repeat in 3 days prn (Patient not taking: Reported on 04/11/2020) 2 tablet 0   No current facility-administered medications on file prior to visit.     Objective:  Objective  Physical Exam Vitals and nursing note reviewed. Exam conducted with a chaperone present.  Constitutional:      Appearance: She is well-developed.  HENT:     Head: Normocephalic and atraumatic.  Eyes:     Conjunctiva/sclera: Conjunctivae normal.  Neck:     Thyroid: No thyromegaly.     Vascular: No carotid bruit or JVD.  Cardiovascular:     Rate and Rhythm: Normal rate and regular rhythm.     Heart sounds: Normal heart sounds. No murmur heard.   Pulmonary:     Effort: Pulmonary effort is normal. No respiratory distress.     Breath sounds: Normal breath sounds. No wheezing or rales.  Chest:     Chest wall: No tenderness.  Abdominal:     Hernia: There is no hernia in the left inguinal area or right inguinal area.  Genitourinary:    Exam position: Lithotomy position.     Pubic Area: No rash.      Vagina: Vaginal discharge and erythema present.  Musculoskeletal:     Cervical back: Normal range of motion and neck supple.  Lymphadenopathy:     Lower Body: No right inguinal adenopathy. No left inguinal adenopathy.  Neurological:     Mental Status: She is alert and oriented to person, place, and time.    BP 124/60 (BP Location: Right Arm, Patient Position: Sitting, Cuff Size: Large)   Pulse 70   Temp (!) 97.5 F (36.4 C) (Oral)   Resp 18   Ht 5' 6.5" (1.689 m)   Wt 209 lb (94.8 kg)   LMP 06/20/2013   SpO2 97%   BMI 33.23 kg/m  Wt Readings from Last 3 Encounters:  04/11/20 209  lb (94.8 kg)  03/02/20 209 lb 9.6 oz (95.1 kg)  04/01/18 212 lb (96.2 kg)     Lab Results  Component Value Date   WBC 9.0 04/01/2018   HGB 14.0 04/01/2018   HCT 42.8 04/01/2018   PLT 367 04/01/2018   GLUCOSE 80 04/01/2018   ALT 25 04/01/2018   AST 24 04/01/2018   NA 138 04/01/2018   K 3.4 (L) 04/01/2018   CL 99 04/01/2018   CREATININE 0.84 04/01/2018   BUN 11 04/01/2018   CO2 31 04/01/2018   TSH 0.64 10/31/2016   HGBA1C 6.1 (H) 09/20/2017    No results found.   Assessment & Plan:  Plan  I am having Ariana Cohen. Ariana Cohen start on terconazole. I am also having her maintain her cetirizine, allopurinol, spironolactone, telmisartan-hydrochlorothiazide, and fluconazole.  Meds ordered this encounter  Medications  . terconazole (TERAZOL 7) 0.4 % vaginal cream    Sig: Place 1 applicator vaginally at bedtime.    Dispense:  45 g    Refill:  0    Problem List Items Addressed This Visit    None    Visit Diagnoses    Colon cancer screening    -  Primary   Relevant Orders   Ambulatory referral to Gastroenterology   Acute vaginitis       Relevant Medications   terconazole (TERAZOL 7) 0.4 % vaginal cream   Other Relevant Orders   POCT Urinalysis Dipstick (Automated) (Completed)   Urine Culture      Follow-up: Return if symptoms worsen or fail to improve.  Ann Held, DO

## 2020-04-11 NOTE — Patient Instructions (Signed)
Vaginitis Vaginitis is a condition in which the vaginal tissue swells and becomes red (inflamed). This condition is most often caused by a change in the normal balance of bacteria and yeast that live in the vagina. This change causes an overgrowth of certain bacteria or yeast, which causes the inflammation. There are different types of vaginitis, but the most common types are:  Bacterial vaginosis.  Yeast infection (candidiasis).  Trichomoniasis vaginitis. This is a sexually transmitted disease (STD).  Viral vaginitis.  Atrophic vaginitis.  Allergic vaginitis. What are the causes? The cause of this condition depends on the type of vaginitis. It can be caused by:  Bacteria (bacterial vaginosis).  Yeast, which is a fungus (yeast infection).  A parasite (trichomoniasis vaginitis).  A virus (viral vaginitis).  Low hormone levels (atrophic vaginitis). Low hormone levels can occur during pregnancy, breastfeeding, or after menopause.  Irritants, such as bubble baths, scented tampons, and feminine sprays (allergic vaginitis). Other factors can change the normal balance of the yeast and bacteria that live in the vagina. These include:  Antibiotic medicines.  Poor hygiene.  Diaphragms, vaginal sponges, spermicides, birth control pills, and intrauterine devices (IUD).  Sex.  Infection.  Uncontrolled diabetes.  A weakened defense (immune) system. What increases the risk? This condition is more likely to develop in women who:  Smoke.  Use vaginal douches, scented tampons, or scented sanitary pads.  Wear tight-fitting pants.  Wear thong underwear.  Use oral birth control pills or an IUD.  Have sex without a condom.  Have multiple sex partners.  Have an STD.  Frequently use the spermicide nonoxynol-9.  Eat lots of foods high in sugar.  Have uncontrolled diabetes.  Have low estrogen levels.  Have a weakened immune system from an immune disorder or medical  treatment.  Are pregnant or breastfeeding. What are the signs or symptoms? Symptoms vary depending on the cause of the vaginitis. Common symptoms include:  Abnormal vaginal discharge. ? The discharge is white, gray, or yellow with bacterial vaginosis. ? The discharge is thick, white, and cheesy with a yeast infection. ? The discharge is frothy and yellow or greenish with trichomoniasis.  A bad vaginal smell. The smell is fishy with bacterial vaginosis.  Vaginal itching, pain, or swelling.  Sex that is painful.  Pain or burning when urinating. Sometimes there are no symptoms. How is this diagnosed? This condition is diagnosed based on your symptoms and medical history. A physical exam, including a pelvic exam, will also be done. You may also have other tests, including:  Tests to determine the pH level (acidity or alkalinity) of your vagina.  A whiff test, to assess the odor that results when a sample of your vaginal discharge is mixed with a potassium hydroxide solution.  Tests of vaginal fluid. A sample will be examined under a microscope. How is this treated? Treatment varies depending on the type of vaginitis you have. Your treatment may include:  Antibiotic creams or pills to treat bacterial vaginosis and trichomoniasis.  Antifungal medicines, such as vaginal creams or suppositories, to treat a yeast infection.  Medicine to ease discomfort if you have viral vaginitis. Your sexual partner should also be treated.  Estrogen delivered in a cream, pill, suppository, or vaginal ring to treat atrophic vaginitis. If vaginal dryness occurs, lubricants and moisturizing creams may help. You may need to avoid scented soaps, sprays, or douches.  Stopping use of a product that is causing allergic vaginitis. Then using a vaginal cream to treat the symptoms. Follow   these instructions at home: Lifestyle  Keep your genital area clean and dry. Avoid soap, and only rinse the area with  water.  Do not douche or use tampons until your health care provider says it is okay to do so. Use sanitary pads, if needed.  Do not have sex until your health care provider approves. When you can return to sex, practice safe sex and use condoms.  Wipe from front to back. This avoids the spread of bacteria from the rectum to the vagina. General instructions  Take over-the-counter and prescription medicines only as told by your health care provider.  If you were prescribed an antibiotic medicine, take or use it as told by your health care provider. Do not stop taking or using the antibiotic even if you start to feel better.  Keep all follow-up visits as told by your health care provider. This is important. How is this prevented?  Use mild, non-scented products. Do not use things that can irritate the vagina, such as fabric softeners. Avoid the following products if they are scented: ? Feminine sprays. ? Detergents. ? Tampons. ? Feminine hygiene products. ? Soaps or bubble baths.  Let air reach your genital area. ? Wear cotton underwear to reduce moisture buildup. ? Avoid wearing underwear while you sleep. ? Avoid wearing tight pants and underwear or nylons without a cotton panel. ? Avoid wearing thong underwear.  Take off any wet clothing, such as bathing suits, as soon as possible.  Practice safe sex and use condoms. Contact a health care provider if:  You have abdominal pain.  You have a fever.  You have symptoms that last for more than 2-3 days. Get help right away if:  You have a fever and your symptoms suddenly get worse. Summary  Vaginitis is a condition in which the vaginal tissue becomes inflamed.This condition is most often caused by a change in the normal balance of bacteria and yeast that live in the vagina.  Treatment varies depending on the type of vaginitis you have.  Do not douche, use tampons , or have sex until your health care provider approves. When  you can return to sex, practice safe sex and use condoms. This information is not intended to replace advice given to you by your health care provider. Make sure you discuss any questions you have with your health care provider. Document Revised: 04/04/2017 Document Reviewed: 05/28/2016 Elsevier Patient Education  2020 [employer] Inc[employer]  

## 2020-04-12 LAB — URINE CULTURE
MICRO NUMBER:: 11286623
SPECIMEN QUALITY:: ADEQUATE

## 2020-04-19 ENCOUNTER — Encounter: Payer: Self-pay | Admitting: Family Medicine

## 2020-04-20 NOTE — Telephone Encounter (Signed)
We should re do urine culture and should have pt do self swab --- or come in for pelvic exam

## 2020-04-25 ENCOUNTER — Other Ambulatory Visit: Payer: Self-pay

## 2020-04-25 ENCOUNTER — Encounter: Payer: Self-pay | Admitting: Family Medicine

## 2020-04-25 ENCOUNTER — Other Ambulatory Visit (HOSPITAL_COMMUNITY)
Admission: RE | Admit: 2020-04-25 | Discharge: 2020-04-25 | Disposition: A | Discharge status: Home / Self Care | Payer: BC Managed Care – PPO | Source: Ambulatory Visit | Attending: Family Medicine | Admitting: Family Medicine

## 2020-04-25 ENCOUNTER — Encounter: Payer: BC Managed Care – PPO | Admitting: Family Medicine

## 2020-04-25 VITALS — BP 110/78 | HR 65 | Temp 97.9°F | Resp 18 | Ht 66.5 in | Wt 208.6 lb

## 2020-04-25 DIAGNOSIS — N760 Acute vaginitis: Secondary | ICD-10-CM

## 2020-04-25 DIAGNOSIS — R3129 Other microscopic hematuria: Secondary | ICD-10-CM

## 2020-04-25 LAB — POC URINALSYSI DIPSTICK (AUTOMATED)
Bilirubin, UA: NEGATIVE
Glucose, UA: NEGATIVE
Ketones, UA: NEGATIVE
Leukocytes, UA: NEGATIVE
Nitrite, UA: NEGATIVE
Protein, UA: NEGATIVE
Spec Grav, UA: >=1.03 — AB (ref 1.010–1.025)
Urobilinogen, UA: 0.2 E.U./dL
pH, UA: 5 (ref 5.0–8.0)

## 2020-04-25 MED ORDER — TERCONAZOLE 0.8 % VA CREA: 1.0000 | TOPICAL_CREAM | Freq: Every day | VAGINAL | 0 refills | Status: DC

## 2020-04-25 NOTE — Patient Instructions (Signed)
Vaginitis Vaginitis is a condition in which the vaginal tissue swells and becomes red (inflamed). This condition is most often caused by a change in the normal balance of bacteria and yeast that live in the vagina. This change causes an overgrowth of certain bacteria or yeast, which causes the inflammation. There are different types of vaginitis, but the most common types are:  Bacterial vaginosis.  Yeast infection (candidiasis).  Trichomoniasis vaginitis. This is a sexually transmitted disease (STD).  Viral vaginitis.  Atrophic vaginitis.  Allergic vaginitis. What are the causes? The cause of this condition depends on the type of vaginitis. It can be caused by:  Bacteria (bacterial vaginosis).  Yeast, which is a fungus (yeast infection).  A parasite (trichomoniasis vaginitis).  A virus (viral vaginitis).  Low hormone levels (atrophic vaginitis). Low hormone levels can occur during pregnancy, breastfeeding, or after menopause.  Irritants, such as bubble baths, scented tampons, and feminine sprays (allergic vaginitis). Other factors can change the normal balance of the yeast and bacteria that live in the vagina. These include:  Antibiotic medicines.  Poor hygiene.  Diaphragms, vaginal sponges, spermicides, birth control pills, and intrauterine devices (IUD).  Sex.  Infection.  Uncontrolled diabetes.  A weakened defense (immune) system. What increases the risk? This condition is more likely to develop in women who:  Smoke.  Use vaginal douches, scented tampons, or scented sanitary pads.  Wear tight-fitting pants.  Wear thong underwear.  Use oral birth control pills or an IUD.  Have sex without a condom.  Have multiple sex partners.  Have an STD.  Frequently use the spermicide nonoxynol-9.  Eat lots of foods high in sugar.  Have uncontrolled diabetes.  Have low estrogen levels.  Have a weakened immune system from an immune disorder or medical  treatment.  Are pregnant or breastfeeding. What are the signs or symptoms? Symptoms vary depending on the cause of the vaginitis. Common symptoms include:  Abnormal vaginal discharge. ? The discharge is white, gray, or yellow with bacterial vaginosis. ? The discharge is thick, white, and cheesy with a yeast infection. ? The discharge is frothy and yellow or greenish with trichomoniasis.  A bad vaginal smell. The smell is fishy with bacterial vaginosis.  Vaginal itching, pain, or swelling.  Sex that is painful.  Pain or burning when urinating. Sometimes there are no symptoms. How is this diagnosed? This condition is diagnosed based on your symptoms and medical history. A physical exam, including a pelvic exam, will also be done. You may also have other tests, including:  Tests to determine the pH level (acidity or alkalinity) of your vagina.  A whiff test, to assess the odor that results when a sample of your vaginal discharge is mixed with a potassium hydroxide solution.  Tests of vaginal fluid. A sample will be examined under a microscope. How is this treated? Treatment varies depending on the type of vaginitis you have. Your treatment may include:  Antibiotic creams or pills to treat bacterial vaginosis and trichomoniasis.  Antifungal medicines, such as vaginal creams or suppositories, to treat a yeast infection.  Medicine to ease discomfort if you have viral vaginitis. Your sexual partner should also be treated.  Estrogen delivered in a cream, pill, suppository, or vaginal ring to treat atrophic vaginitis. If vaginal dryness occurs, lubricants and moisturizing creams may help. You may need to avoid scented soaps, sprays, or douches.  Stopping use of a product that is causing allergic vaginitis. Then using a vaginal cream to treat the symptoms. Follow   these instructions at home: Lifestyle  Keep your genital area clean and dry. Avoid soap, and only rinse the area with  water.  Do not douche or use tampons until your health care provider says it is okay to do so. Use sanitary pads, if needed.  Do not have sex until your health care provider approves. When you can return to sex, practice safe sex and use condoms.  Wipe from front to back. This avoids the spread of bacteria from the rectum to the vagina. General instructions  Take over-the-counter and prescription medicines only as told by your health care provider.  If you were prescribed an antibiotic medicine, take or use it as told by your health care provider. Do not stop taking or using the antibiotic even if you start to feel better.  Keep all follow-up visits as told by your health care provider. This is important. How is this prevented?  Use mild, non-scented products. Do not use things that can irritate the vagina, such as fabric softeners. Avoid the following products if they are scented: ? Feminine sprays. ? Detergents. ? Tampons. ? Feminine hygiene products. ? Soaps or bubble baths.  Let air reach your genital area. ? Wear cotton underwear to reduce moisture buildup. ? Avoid wearing underwear while you sleep. ? Avoid wearing tight pants and underwear or nylons without a cotton panel. ? Avoid wearing thong underwear.  Take off any wet clothing, such as bathing suits, as soon as possible.  Practice safe sex and use condoms. Contact a health care provider if:  You have abdominal pain.  You have a fever.  You have symptoms that last for more than 2-3 days. Get help right away if:  You have a fever and your symptoms suddenly get worse. Summary  Vaginitis is a condition in which the vaginal tissue becomes inflamed.This condition is most often caused by a change in the normal balance of bacteria and yeast that live in the vagina.  Treatment varies depending on the type of vaginitis you have.  Do not douche, use tampons , or have sex until your health care provider approves. When  you can return to sex, practice safe sex and use condoms. This information is not intended to replace advice given to you by your health care provider. Make sure you discuss any questions you have with your health care provider. Document Revised: 04/04/2017 Document Reviewed: 05/28/2016 Elsevier Patient Education  2020 [employer] Inc[employer]  

## 2020-04-25 NOTE — Progress Notes (Signed)
This encounter was created in error - please disregard.

## 2020-04-26 ENCOUNTER — Encounter: Payer: Self-pay | Admitting: Family Medicine

## 2020-04-26 LAB — CERVICOVAGINAL ANCILLARY ONLY
Bacterial Vaginitis (gardnerella): NEGATIVE
Candida Glabrata: NEGATIVE
Candida Vaginitis: NEGATIVE
Chlamydia: NEGATIVE
Comment: NEGATIVE
Comment: NEGATIVE
Comment: NEGATIVE
Comment: NEGATIVE
Comment: NEGATIVE
Comment: NORMAL
Neisseria Gonorrhea: NEGATIVE
Trichomonas: NEGATIVE

## 2020-04-26 LAB — URINE CULTURE
MICRO NUMBER:: 11343266
SPECIMEN QUALITY:: ADEQUATE

## 2020-04-26 NOTE — Telephone Encounter (Signed)
It should have said 3 days

## 2020-05-23 ENCOUNTER — Telehealth: Payer: Self-pay

## 2020-05-23 NOTE — Telephone Encounter (Signed)
Nurse Assessment Nurse: Sumner Boast, RN, Enid Derry Date/Time Eilene Ghazi Time): 05/23/2020 2:21:27 PM Confirm and document reason for call. If symptomatic, describe symptoms. ---Caller states she fell backwards today and hit her head on ice. She fell about 5 hrs ago. No LOC. No bleeding. States she is now having headaches. Pain is 3-4 out of 10 pain scale. No vision changes. No fever. Does the patient have any new or worsening symptoms? ---Yes Will a triage be completed? ---Yes Related visit to physician within the last 2 weeks? ---No Does the PT have any chronic conditions? (i.e. diabetes, asthma, this includes High risk factors for pregnancy, etc.) ---Yes List chronic conditions. ---Hypertension Pre-Diabetic Is this a behavioral health or substance abuse call? ---No Guidelines Guideline Title Affirmed Question Affirmed Notes Nurse Date/Time Eilene Ghazi Time) Head Injury Scalp swelling, bruise or pain Sumner Boast, Valentino Nose 05/23/2020 2:24:53 PM Disp. Time Eilene Ghazi Time) Disposition Final User 05/23/2020 2:19:44 PM Send to Urgent Queue Silvano Rusk 05/23/2020 2:29:03 PM [place], RN, Teola Bradley Disagree/Comply Comply PLEASE NOTE: All timestamps contained within this report are represented as [place] Standard Time. CONFIDENTIALTY NOTICE: This fax transmission is intended only for the addressee. It contains information that is legally privileged, confidential or otherwise protected from use or disclosure. If you are not the intended recipient, you are strictly prohibited from reviewing, disclosing, copying using or disseminating any of this information or taking any action in reliance on or regarding this information. If you have received this fax in error, please notify us immediately by telephone so that we can arrange for its return to [place]. Phone: [phone], Toll-Free: [phone], Fax: [phone] Page: 2 of 2 Call Id: [id number] [facility] Understands Yes PreDisposition Call  Doctor Care Advice Given Per Guideline HOME CARE: * NAPROXEN (E.G., ALEVE): Take 220 mg (one 220 mg pill) by mouth every 8 to 12 hours as needed. You may take 440 mg (two 220 mg pills) for your first dose. The most you should take each day is 660 mg (three 220 mg pills a day), unless your doctor has told you to take more. CALL BACK IF: * Vomiting occurs * You become worse * Slurred speech or blurred vision occurs

## 2020-05-23 NOTE — Telephone Encounter (Signed)
Spoke with patient. I advised patient to be seen at the ED. Pt states she probably won't go today. Pt advised if sxs worsen, worsening headache, dizziness, blurred vision, etc Pt should be seen immediately. Also advised patient to have someone with her to watch for any changes to not take anymore Aleve.

## 2020-05-24 NOTE — Telephone Encounter (Signed)
If she was not seen in er and she still has headache-------this week

## 2020-06-01 ENCOUNTER — Encounter: Payer: BC Managed Care – PPO | Admitting: Family Medicine

## 2020-06-04 ENCOUNTER — Encounter (INDEPENDENT_AMBULATORY_CARE_PROVIDER_SITE_OTHER): Payer: Self-pay

## 2020-06-05 ENCOUNTER — Encounter: Payer: Self-pay | Admitting: Gastroenterology

## 2020-06-09 DIAGNOSIS — Z08 Encounter for follow-up examination after completed treatment for malignant neoplasm: Secondary | ICD-10-CM | POA: Diagnosis not present

## 2020-06-09 DIAGNOSIS — D241 Benign neoplasm of right breast: Secondary | ICD-10-CM | POA: Diagnosis not present

## 2020-06-09 DIAGNOSIS — Z9889 Other specified postprocedural states: Secondary | ICD-10-CM | POA: Diagnosis not present

## 2020-06-09 DIAGNOSIS — Z853 Personal history of malignant neoplasm of breast: Secondary | ICD-10-CM | POA: Diagnosis not present

## 2020-07-24 ENCOUNTER — Ambulatory Visit (AMBULATORY_SURGERY_CENTER): Payer: BC Managed Care – PPO

## 2020-07-24 ENCOUNTER — Other Ambulatory Visit: Payer: Self-pay

## 2020-07-24 VITALS — Ht 66.5 in | Wt 202.0 lb

## 2020-07-24 DIAGNOSIS — Z1211 Encounter for screening for malignant neoplasm of colon: Secondary | ICD-10-CM

## 2020-07-24 MED ORDER — PEG 3350-KCL-NA BICARB-NACL 420 G PO SOLR: 4000.0000 mL | Freq: Once | ORAL | 0 refills | Status: AC

## 2020-07-24 NOTE — Progress Notes (Signed)
[facility]  No egg or soy allergy known to patient  No issues with past sedation with any surgeries or procedures Patient denies ever being told they had issues or difficulty with intubation  No FH of Malignant Hyperthermia No diet pills per patient No home 02 use per patient  No blood thinners per patient  Pt denies issues with constipation  No A fib or A flutter  EMMI video to pt or via MyChart  COVID 19 guidelines implemented in [facility] today with Pt and RN  Pt is fully vaccinated  for Covid   Due to the COVID-19 pandemic we are asking patients to follow certain guidelines.  Pt aware of COVID protocols and [facility] guidelines   

## 2020-08-03 ENCOUNTER — Encounter: Payer: BC Managed Care – PPO | Admitting: Gastroenterology

## 2020-08-03 ENCOUNTER — Encounter: Payer: Self-pay | Admitting: Gastroenterology

## 2020-08-04 ENCOUNTER — Ambulatory Visit (AMBULATORY_SURGERY_CENTER): Payer: BC Managed Care – PPO | Admitting: Gastroenterology

## 2020-08-04 ENCOUNTER — Other Ambulatory Visit: Payer: Self-pay

## 2020-08-04 ENCOUNTER — Encounter: Payer: Self-pay | Admitting: Gastroenterology

## 2020-08-04 VITALS — BP 134/88 | HR 64 | Temp 97.9°F | Resp 11 | Ht 66.5 in | Wt 202.0 lb

## 2020-08-04 DIAGNOSIS — Z1211 Encounter for screening for malignant neoplasm of colon: Secondary | ICD-10-CM | POA: Diagnosis not present

## 2020-08-04 DIAGNOSIS — D123 Benign neoplasm of transverse colon: Secondary | ICD-10-CM

## 2020-08-04 DIAGNOSIS — K621 Rectal polyp: Secondary | ICD-10-CM

## 2020-08-04 DIAGNOSIS — D128 Benign neoplasm of rectum: Secondary | ICD-10-CM

## 2020-08-04 DIAGNOSIS — K635 Polyp of colon: Secondary | ICD-10-CM | POA: Diagnosis not present

## 2020-08-04 MED ORDER — SODIUM CHLORIDE 0.9 % IV SOLN: 500.0000 mL | Freq: Once | INTRAVENOUS | Status: DC

## 2020-08-04 NOTE — Op Note (Signed)
[place] Patient Name: Ariana Cohen Procedure Date: 08/04/2020 9:20 AM MRN: [id number] Endoscopist: Mauri Pole , MD Age: 60 Referring MD:  Date of Birth: [birth year] Gender: Female Account #: [id number] Procedure:                Colonoscopy Indications:              Screening for colorectal malignant neoplasm Medicines:                Monitored Anesthesia Care Procedure:                Pre-Anesthesia Assessment:                           - Prior to the procedure, a History and Physical                            was performed, and patient medications and                            allergies were reviewed. The patient's tolerance of                            previous anesthesia was also reviewed. The risks                            and benefits of the procedure and the sedation                            options and risks were discussed with the patient.                            All questions were answered, and informed consent                            was obtained. Prior Anticoagulants: The patient has                            taken no previous anticoagulant or antiplatelet                            agents. ASA Grade Assessment: III - A patient with                            severe systemic disease. After reviewing the risks                            and benefits, the patient was deemed in                            satisfactory condition to undergo the procedure.                           After obtaining informed consent, the colonoscope  was passed under direct vision. Throughout the                            procedure, the patient's blood pressure, pulse, and                            oxygen saturations were monitored continuously. The                            Olympus PFC-[id number] (#[id number]) Colonoscope was                            introduced through the anus and advanced to the the                            cecum,  identified by appendiceal orifice and                            ileocecal valve. The colonoscopy was performed                            without difficulty. The patient tolerated the                            procedure well. The quality of the bowel                            preparation was good. The ileocecal valve,                            appendiceal orifice, and rectum were photographed. Scope In: 9:30:24 AM Scope Out: 9:48:12 AM Scope Withdrawal Time: 0 hours 15 minutes 5 seconds  Total Procedure Duration: 0 hours 17 minutes 48 seconds  Findings:                 The perianal and digital rectal examinations were                            normal.                           Nine sessile polyps were found in the rectum X4 and                            transverse colon X 5. The polyps were 4 to 11 mm in                            size. These polyps were removed with a cold snare.                            Resection and retrieval were complete.                           Non-bleeding external and internal hemorrhoids were  found during retroflexion. The hemorrhoids were                            medium-sized. Complications:            No immediate complications. Estimated Blood Loss:     Estimated blood loss was minimal. Impression:               - Nine 4 to 11 mm polyps in the rectum and in the                            transverse colon, removed with a cold snare.                            Resected and retrieved.                           - Non-bleeding external and internal hemorrhoids. Recommendation:           - Patient has a contact number available for                            emergencies. The signs and symptoms of potential                            delayed complications were discussed with the                            patient. Return to normal activities tomorrow.                            Written discharge instructions were provided to  the                            patient.                           - Resume previous diet.                           - Continue present medications.                           - Await pathology results.                           - Repeat colonoscopy in 3 years for surveillance                            based on pathology results. Mauri Pole, MD 08/04/2020 9:53:29 AM This report has been signed electronically.

## 2020-08-04 NOTE — Patient Instructions (Signed)
YOU HAD AN ENDOSCOPIC PROCEDURE TODAY AT THE [facility]:   Refer to the procedure report that was given to you for any specific questions about what was found during the examination.  If the procedure report does not answer your questions, please call your gastroenterologist to clarify.  If you requested that your care partner not be given the details of your procedure findings, then the procedure report has been included in a sealed envelope for you to review at your convenience later.  YOU SHOULD EXPECT: Some feelings of bloating in the abdomen. Passage of more gas than usual.  Walking can help get rid of the air that was put into your GI tract during the procedure and reduce the bloating. If you had a lower endoscopy (such as a colonoscopy or flexible sigmoidoscopy) you may notice spotting of blood in your stool or on the toilet paper. If you underwent a bowel prep for your procedure, you may not have a normal bowel movement for a few days.  Please Note:  You might notice some irritation and congestion in your nose or some drainage.  This is from the oxygen used during your procedure.  There is no need for concern and it should clear up in a day or so.  SYMPTOMS TO REPORT IMMEDIATELY:   Following lower endoscopy (colonoscopy or flexible sigmoidoscopy):  Excessive amounts of blood in the stool  Significant tenderness or worsening of abdominal pains  Swelling of the abdomen that is new, acute  Fever of 100F or higher   Following upper endoscopy (EGD)  Vomiting of blood or coffee ground material  New chest pain or pain under the shoulder blades  Painful or persistently difficult swallowing  New shortness of breath  Fever of 100F or higher  Black, tarry-looking stools  For urgent or emergent issues, a gastroenterologist can be reached at any hour by calling [phone]. Do not use MyChart messaging for urgent concerns.    DIET:  We do recommend a small meal at first, but  then you may proceed to your regular diet.  Drink plenty of fluids but you should avoid alcoholic beverages for 24 hours.  ACTIVITY:  You should plan to take it easy for the rest of today and you should NOT DRIVE or use heavy machinery until tomorrow (because of the sedation medicines used during the test).    FOLLOW UP: Our staff will call the number listed on your records 48-72 hours following your procedure to check on you and address any questions or concerns that you may have regarding the information given to you following your procedure. If we do not reach you, we will leave a message.  We will attempt to reach you two times.  During this call, we will ask if you have developed any symptoms of COVID 19. If you develop any symptoms (ie: fever, flu-like symptoms, shortness of breath, cough etc.) before then, please call [phone].  If you test positive for Covid 19 in the 2 weeks post procedure, please call and report this information to us.    If any biopsies were taken you will be contacted by phone or by letter within the next 1-3 weeks.  Please call us at [phone] if you have not heard about the biopsies in 3 weeks.    SIGNATURES/CONFIDENTIALITY: You and/or your care partner have signed paperwork which will be entered into your electronic medical record.  These signatures attest to the fact that that the information above on   your After Visit Summary has been reviewed and is understood.  Full responsibility of the confidentiality of this discharge information lies with you and/or your care-partner. 

## 2020-08-04 NOTE — Progress Notes (Signed)
Called to room to assist during endoscopic procedure.  Patient ID and intended procedure confirmed with present staff. Received instructions for my participation in the procedure from the performing physician.  

## 2020-08-04 NOTE — Progress Notes (Signed)
pt tolerated well. VSS. awake and to recovery. Report given to RN.  

## 2020-08-04 NOTE — Progress Notes (Signed)
VS-CW  Pt's states no medical or surgical changes since previsit or office visit.  

## 2020-08-08 ENCOUNTER — Telehealth: Payer: Self-pay | Admitting: *Deleted

## 2020-08-08 NOTE — Telephone Encounter (Signed)
  Follow up Call-  Call back number 08/04/2020  Post procedure Call Back phone  # [phone]  Permission to leave phone message Yes  Some recent data might be hidden     Patient questions:  Do you have a fever, pain , or abdominal swelling? No. Pain Score  0 *  Have you tolerated food without any problems? Yes.    Have you been able to return to your normal activities? Yes.    Do you have any questions about your discharge instructions: Diet   No. Medications  No. Follow up visit  No.  Do you have questions or concerns about your Care? No.  Actions: * If pain score is 4 or above: No action needed, pain <4

## 2020-08-29 ENCOUNTER — Encounter: Payer: Self-pay | Admitting: Gastroenterology

## 2020-12-30 DIAGNOSIS — R058 Other specified cough: Secondary | ICD-10-CM | POA: Diagnosis not present

## 2020-12-30 DIAGNOSIS — Z20822 Contact with and (suspected) exposure to covid-19: Secondary | ICD-10-CM | POA: Diagnosis not present

## 2020-12-30 DIAGNOSIS — R059 Cough, unspecified: Secondary | ICD-10-CM | POA: Diagnosis not present

## 2021-01-25 DIAGNOSIS — R5383 Other fatigue: Secondary | ICD-10-CM | POA: Diagnosis not present

## 2021-01-25 DIAGNOSIS — R7303 Prediabetes: Secondary | ICD-10-CM | POA: Diagnosis not present

## 2021-01-25 DIAGNOSIS — E782 Mixed hyperlipidemia: Secondary | ICD-10-CM | POA: Diagnosis not present

## 2021-01-25 DIAGNOSIS — I10 Essential (primary) hypertension: Secondary | ICD-10-CM | POA: Diagnosis not present

## 2021-01-25 DIAGNOSIS — I493 Ventricular premature depolarization: Secondary | ICD-10-CM | POA: Diagnosis not present

## 2021-02-03 ENCOUNTER — Emergency Department (HOSPITAL_COMMUNITY): Payer: BC Managed Care – PPO

## 2021-02-03 ENCOUNTER — Emergency Department (HOSPITAL_COMMUNITY)
Admission: EM | Admit: 2021-02-03 | Discharge: 2021-02-03 | Disposition: A | Discharge status: Home / Self Care | Payer: BC Managed Care – PPO | Attending: Emergency Medicine | Admitting: Emergency Medicine

## 2021-02-03 ENCOUNTER — Encounter (HOSPITAL_COMMUNITY): Payer: Self-pay

## 2021-02-03 ENCOUNTER — Other Ambulatory Visit: Payer: Self-pay

## 2021-02-03 DIAGNOSIS — I10 Essential (primary) hypertension: Secondary | ICD-10-CM | POA: Insufficient documentation

## 2021-02-03 DIAGNOSIS — R0602 Shortness of breath: Secondary | ICD-10-CM | POA: Diagnosis not present

## 2021-02-03 DIAGNOSIS — R0789 Other chest pain: Secondary | ICD-10-CM | POA: Insufficient documentation

## 2021-02-03 DIAGNOSIS — R9431 Abnormal electrocardiogram [ECG] [EKG]: Secondary | ICD-10-CM | POA: Diagnosis not present

## 2021-02-03 DIAGNOSIS — R079 Chest pain, unspecified: Secondary | ICD-10-CM | POA: Diagnosis not present

## 2021-02-03 DIAGNOSIS — Z79899 Other long term (current) drug therapy: Secondary | ICD-10-CM | POA: Insufficient documentation

## 2021-02-03 DIAGNOSIS — R072 Precordial pain: Secondary | ICD-10-CM | POA: Diagnosis not present

## 2021-02-03 LAB — CBC
HCT: 45.3 % (ref 36.0–46.0)
Hemoglobin: 14 g/dL (ref 12.0–15.0)
MCH: 26.9 pg (ref 26.0–34.0)
MCHC: 30.9 g/dL (ref 30.0–36.0)
MCV: 87.1 fL (ref 80.0–100.0)
Platelets: 312 10*3/uL (ref 150–400)
RBC: 5.2 MIL/uL — ABNORMAL HIGH (ref 3.87–5.11)
RDW: 13.3 % (ref 11.5–15.5)
WBC: 7.7 10*3/uL (ref 4.0–10.5)
nRBC: 0 % (ref 0.0–0.2)

## 2021-02-03 LAB — BASIC METABOLIC PANEL
Anion gap: 8 (ref 5–15)
BUN: 14 mg/dL (ref 6–20)
CO2: 26 mmol/L (ref 22–32)
Calcium: 9.4 mg/dL (ref 8.9–10.3)
Chloride: 101 mmol/L (ref 98–111)
Creatinine, Ser: 0.81 mg/dL (ref 0.44–1.00)
GFR, Estimated: >60 mL/min (ref >60)
Glucose, Bld: 91 mg/dL (ref 70–99)
Potassium: 3.8 mmol/L (ref 3.5–5.1)
Sodium: 135 mmol/L (ref 135–145)

## 2021-02-03 LAB — TROPONIN I (HIGH SENSITIVITY)
Troponin I (High Sensitivity): 6 ng/L (ref <18)
Troponin I (High Sensitivity): 6 ng/L (ref <18)

## 2021-02-03 NOTE — ED Notes (Signed)
Patient transported to X-ray 

## 2021-02-03 NOTE — Discharge Instructions (Addendum)
It was our pleasure taking care of you here in the emergency department today  As discussed in the room your work-up was reassuring.  Your heart enzymes did not show any evidence of a heart attack.  Your chest x-ray did not show an enlarged heart, fluid, infection.  Your EKG did not show you are having a heart attack.  I have placed a referral to the [facility] cardiology group.  They should call you to schedule an appointment  Return for new or worsening symptoms

## 2021-02-03 NOTE — ED Provider Notes (Signed)
[facility] EM[facility]Y [facility]MENT Provider Note   CSN: [id number] Arrival date & time: 02/03/21  1429     History Chief Complaint  Patient presents with   Chest Pain    Ariana Cohen is a 60 y.o. female with history significant for hypertension who presents for evaluation of chest pain.  Patient states she has had intermittent nonradiating chest pain over the last few weeks.  Pain is not exertional or pleuritic in nature.  Pain typically lasts for a few seconds and resolves.  Sometimes on the right side sometimes on the left.  Not worse with movement.  No associated diaphoresis, nausea, vomiting or shortness of breath.  Patient states she also has intermittent shortness of breath.  Sometimes feels she cannot catch her breath however Sob not associated with CP.  No dyspnea on exertion.  States typically she can walk distances without any pain or shortness of breath.  She is followed at [facility] cardiology however would like to transfer to [facility] Card[facility] group.  She denies any lower extremity edema, history of PE, DVT, CHF, recent surgery, immobilization or malignancy.  States when she was seen by cardiology last week they were going to put her in for an echocardiogram.  States this cannot be done until the 21st and she is wondering if we can do when here in the emergency department.  She denies any current pain.  Denies additional aggravating or alleviating factors.  History obtained from patient past medical records.  No interpreter used.  HPI     Past Medical History:  Diagnosis Date   Allergy    Anxiety    Blood transfusion without reported diagnosis 2000   Depression    Fibroids    Heart murmur    Hypertension    Sleep apnea    has cpap doesn't use all the time   Urticaria     Patient Active Problem List   Diagnosis Date Noted   Vaginal itching 03/05/2020   History of sleep apnea 04/01/2018   Positive ANA (antinuclear antibody) 03/23/2018    Idiopathic chronic gout of multiple sites without tophus 02/27/2018   Hyperuricemia 01/21/2018   Rash 01/21/2018   Great toe pain, left 10/07/2017   Gout involving toe of left foot 08/25/2017   Transient alteration of awareness 10/01/2016   Memory loss 10/01/2016   Obesity (BMI 30.0-34.9) 08/12/2016   Onychomycosis 08/12/2016   Acute reaction to situational stress 07/01/2016   GAD (generalized anxiety disorder) 06/26/2016   OSA (obstructive sleep apnea) 06/12/2016   Snoring 06/11/2016   Other fatigue 03/26/2016   Medication management 11/06/2015   Excessive daytime sleepiness 11/06/2015   Murmur 08/11/2015   PVCs (premature ventricular contractions) 08/11/2015   Shortness of breath 08/11/2015   Essential hypertension 08/11/2015    Past Surgical History:  Procedure Laterality Date   BREAST BIOPSY  2019   [place]; [place]     OB History   No obstetric history on file.     Family History  Problem Relation Age of Onset   Hypertension Mother    Renal Disease Mother    Schizophrenia Mother    Hypertension Father    Heart disease Father        also OSA   Hypertension Maternal Grandmother    Heart attack Maternal Grandmother    Thyroid disease Maternal Grandmother        thyroidectomy   Hypertension Maternal Grandfather  Multiple myeloma Maternal Grandfather    Sleep apnea Maternal Grandfather    Hypertension Sister    Healthy Son    Asthma Daughter    Healthy Daughter    Colon cancer Neg Hx    Esophageal cancer Neg Hx    Stomach cancer Neg Hx    Rectal cancer Neg Hx     Social History   Tobacco Use   Smoking status: Never   Smokeless tobacco: Never  Vaping Use   Vaping Use: Never used  Substance Use Topics   Alcohol use: Yes    Comment: rarely   Drug use: No    Home Medications Prior to Admission medications   Medication Sig Start Date End Date Taking? Authorizing Provider  allopurinol (ZYLOPRIM) 100 MG tablet Take 1  tablet (111m) daily for 1 month and then increase to 2 tablets (2026m for 1 month. 04/01/18   DeBo MerinoMD  fexofenadine (ALLEGRA) 60 MG tablet Take by mouth.    [provider]  Multiple Vitamin (MULTIVITAMIN) capsule Take 1 capsule by mouth daily.    [provider]  spironolactone (ALDACTONE) 25 MG tablet Take 1 tablet (25 mg total) by mouth daily. 09/10/18   HoMinus BreedingMD  telmisartan-hydrochlorothiazide (MICARDIS HCT) 80-25 MG tablet TAKE 1 TABLET BY MOUTH EVERY DAY 09/21/18   Hilty, KeNadean CorwinMD    Allergies    Norvasc [amlodipine besylate], Aspirin, Colchicine, Metoprolol, Minoxidil, and Other  Review of Systems   Review of Systems  Constitutional: Negative.   HENT: Negative.    Respiratory:  Positive for shortness of breath. Negative for apnea, cough, choking, chest tightness, wheezing and stridor.   Cardiovascular:  Positive for chest pain. Negative for palpitations and leg swelling.  Gastrointestinal: Negative.   Genitourinary: Negative.   Musculoskeletal: Negative.   Skin: Negative.   Neurological: Negative.   All other systems reviewed and are negative.  Physical Exam Updated Vital Signs BP 133/84   Pulse 66   Temp 97.6 F (36.4 C) (Oral)   Resp 17   Ht _0  (1.676 m)   Wt 90.7 kg   LMP 06/20/2013   SpO2 99%   BMI 32.28 kg/m   Physical Exam Vitals and nursing note reviewed.  Constitutional:      General: She is not in acute distress.    Appearance: She is well-developed. She is not ill-appearing, toxic-appearing or diaphoretic.  HENT:     Head: Atraumatic.  Eyes:     Pupils: Pupils are equal, round, and reactive to light.  Cardiovascular:     Rate and Rhythm: Normal rate.     Pulses:          Radial pulses are 2+ on the right side and 2+ on the left side.       Dorsalis pedis pulses are 2+ on the right side and 2+ on the left side.     Heart sounds: Normal heart sounds.  Pulmonary:     Effort: Pulmonary effort is  normal. No respiratory distress.     Breath sounds: Normal breath sounds.     Comments: Clear bilaterally, speaks in full sentences without difficulty. Chest:     Comments: Nontender, no overlying skin changes Abdominal:     General: Bowel sounds are normal. There is no distension.     Palpations: Abdomen is soft.     Comments: Soft, nontender  Musculoskeletal:        General: Normal range of motion.     Cervical  back: Normal range of motion.     Right lower leg: No tenderness. No edema.     Left lower leg: No tenderness. No edema.     Comments: Moves all 4 extremities without difficulty.  Compartments soft.  No lower extremity edema.  Skin:    General: Skin is warm and dry.     Capillary Refill: Capillary refill takes less than 2 seconds.     Comments: No edema, erythema or warmth.  Neurological:     General: No focal deficit present.     Mental Status: She is alert.     Comments: Cranial nerves 2-12 grossly intact  Psychiatric:        Mood and Affect: Mood normal.    ED Results / Procedures / Treatments   Labs (all labs ordered are listed, but only abnormal results are displayed) Labs Reviewed  CBC - Abnormal; Notable for the following components:      Result Value   RBC 5.20 (*)    All other components within normal limits  BASIC METABOLIC PANEL  TROPONIN I (HIGH SENSITIVITY)  TROPONIN I (HIGH SENSITIVITY)    EKG EKG Interpretation  Date/Time:  Saturday February 03 2021 14:55:38 EDT Ventricular Rate:  73 PR Interval:  165 QRS Duration: 92 QT Interval:  410 QTC Calculation: 452 R Axis:   8 Text Interpretation: Sinus rhythm Nonspecific T abnormalities, diffuse leads No acute changes No significant change since last tracing Confirmed by Varney Biles [id number]) on 02/03/2021 3:22:01 PM  Radiology DG Chest 2 View  Result Date: 02/03/2021 CLINICAL DATA:  Shortness of breath and chest pain. EXAM: CHEST - 2 VIEW COMPARISON:  Chest radiograph 11/16/2007 FINDINGS:  Monitoring leads overlie the patient. Stable cardiac and mediastinal contours. No consolidative pulmonary opacities. No pleural effusion or pneumothorax. Thoracic degenerative changes. IMPRESSION: No active cardiopulmonary disease. Electronically Signed   By: Lovey Newcomer M.D.   On: 02/03/2021 15:56    Procedures Procedures   Medications Ordered in ED Medications - No data to display  ED Course  I have reviewed the triage vital signs and the nursing notes.  Pertinent labs & imaging results that were available during my care of the patient were reviewed by me and considered in my medical decision making (see chart for details).  Here for evaluation of chest pain.  She is afebrile, nonseptic, not ill-appearing.  Pain is not exertional or pleuritic in nature.  She has no dyspnea on exertion and does not get chest pain with exertion.  She does not appear clinically fluid overloaded.  She is Wells criteria low risk, compartments are soft.  She is neurovascularly intact.  Her heart and lungs are clear.  Abdomen soft, nontender.  No obvious traumatic injuries and I am not able to reduce her pain on exam.  She currently chest pain-free.  Apparently followed by [facility] cardiology, scheduled for an echocardiogram on the 21st however patient would like to see if she can have this done here in the emergency department today.  Labs and imaging personally reviewed and interpreted:  CBC without leukocytosis Metabolic panel without electrolyte, renal abnormality Troponin 6>>6 Chest x-ray without any evidence of cardiomegaly, pulm edema, pneumothorax, infiltrate EKG without ischemic changes. No Wellens  Patient reassessed.  Continues chest pain-free.  Discussed with patient I do not feel she needs emergent echocardiogram here in the emergency department.  We will have her follow-up with her cardiologist.  She is requesting referral to a [facility] group.  I have placed  this.  Discussed return precautions with  patient.  At this time I have low suspicion for acute ACS, unstable angina, PE, dissection, infectious process as cause of her symptoms.  Pt has been advised to return to the ED if CP becomes exertional, associated with diaphoresis or nausea, radiates to left jaw/arm, worsens or becomes concerning in any way. Pt appears reliable for follow up and is agreeable to discharge.   The patient has been appropriately medically screened and/or stabilized in the ED. I have low suspicion for any other emergent medical condition which would require further screening, evaluation or treatment in the ED or require inpatient management.  Patient is hemodynamically stable and in no acute distress.  Patient able to ambulate in department prior to ED.  Evaluation does not show acute pathology that would require ongoing or additional emergent interventions while in the emergency department or further inpatient treatment.  I have discussed the diagnosis with the patient and answered all questions.  Pain is been managed while in the emergency department and patient has no further complaints prior to discharge.  Patient is comfortable with plan discussed in room and is stable for discharge at this time.  I have discussed strict return precautions for returning to the emergency department.  Patient was encouraged to follow-up with PCP/specialist refer to at discharge.     MDM Rules/Calculators/A&P                            Final Clinical Impression(s) / ED Diagnoses Final diagnoses:  Precordial pain    Rx / DC Orders ED Discharge Orders          Ordered    Ambulatory referral to Cardiology        02/03/21 2013             Adrian Prince 02/03/21 2015    Varney Biles, MD 02/04/21 1349

## 2021-02-03 NOTE — ED Triage Notes (Signed)
Pt arrived POV for c/o 3/10 non radiating sharp pain in right and left chest intermittently. Pt states when she gets the pain in her chest it's "pin point" painx1wk. Pt c/o SOB when sittingx3 wks. Pt is A&Ox4.

## 2021-02-03 NOTE — ED Notes (Signed)
Received verbal report from Bassett B RN at this time

## 2021-04-01 NOTE — Progress Notes (Signed)
Cardiology Office Note:    Date:  04/02/2021   ID:  Ariana Cohen, DOB August 07, 1960, MRN [id number]  PCP:  Ann Held, DO  Cardiologist:  None   Referring MD: Nettie Elm, PA-C   Chief Complaint  Patient presents with   Chest Pain   Shortness of Breath     History of Present Illness:    Ariana Cohen is a 60 y.o. female with a hx of  primary difficult to control hypertension, hypokalemia, leg edema, hypokalemia (possibly related to Lasix) and excessive daytime sleepiness and fatigue.  Recent [place] ER visit for chest pain. Previously seen by Dr. Debara Pickett at [facility] and subsequently by [place] with diagnosis mild MR, hypertension, OSA/hypersomnia, and obesity.  6 to 8 weeks ago she presented to the emergency room within the [facility] with vague complaints of shortness of breath and chest discomfort.  The chest discomfort was fleeting and sharp.  Shortness of breath was noticeable when sitting but not necessarily with physical activity.  No orthopnea or PND.  She has been measuring her blood pressures and also noted significant variability.  Past Medical History:  Diagnosis Date   Allergy    Anxiety    Blood transfusion without reported diagnosis 2000   Depression    Fibroids    Heart murmur    Hypertension    Sleep apnea    has cpap doesn't use all the time   Urticaria     Past Surgical History:  Procedure Laterality Date   BREAST BIOPSY  2019   [place]; 2000   MYOMECTOMY  1992    Current Medications: Current Meds  Medication Sig   allopurinol (ZYLOPRIM) 100 MG tablet Take 1 tablet ($RemoveB'100mg'KwJAolcj$ ) daily for 1 month and then increase to 2 tablets ($RemoveBe'200mg'eJxCeMolE$ ) for 1 month.   fexofenadine (ALLEGRA) 60 MG tablet Take by mouth.   Multiple Vitamin (MULTIVITAMIN) capsule Take 1 capsule by mouth daily.   spironolactone (ALDACTONE) 25 MG tablet Take 1 tablet (25 mg total) by mouth daily.    telmisartan-hydrochlorothiazide (MICARDIS HCT) 80-25 MG tablet TAKE 1 TABLET BY MOUTH EVERY DAY     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  Tobacco Use   Smoking status: Never   Smokeless tobacco: Never  Vaping Use   Vaping Use: Never used  Substance and Sexual Activity   Alcohol use: Yes    Comment: rarely   Drug use: No   Sexual activity: Yes  Other Topics Concern   Not on file  Social History Narrative   epworth sleepiness scale = 18 (08/11/15)   Social Determinants of Health   Financial Resource Strain: Not on file  Food Insecurity: Not on file  Transportation Needs: Not on file  Physical Activity: Not on file  Stress: Not on file  Social Connections: Not on file     Family History: The patient's family history includes Asthma in her daughter; Healthy in her daughter and son; Heart attack in her maternal grandmother; Heart disease in her father; Hypertension in her father, maternal grandfather, maternal grandmother, mother, and  sister; Multiple myeloma in her maternal grandfather; Renal Disease in her mother; Schizophrenia in her mother; Sleep apnea in her maternal grandfather; Thyroid disease in her maternal grandmother. There is no history of Colon cancer, Esophageal cancer, Stomach cancer, or Rectal cancer.  ROS:   Please see the history of present illness.    Family history of heart failure (father and several of his siblings).  She does admit to having obstructive sleep apnea, use CPAP for approximately a year, but has more recently discontinued use.  Also admits to adjustments in her medications without recommendations by her physicians.  She is compliant  with her current regimen which includes Aldactone 25 mg/day and telmisartan HCTZ 80/25 mg/day.  All other systems reviewed and are negative.  EKGs/Labs/Other Studies Reviewed:    The following studies were reviewed today:  ECHOCARDIOGRAM 2017: Study Conclusions   - Left ventricle: The cavity size was normal. There was mild    concentric hypertrophy. Systolic function was normal. Wall motion    was normal; there were no regional wall motion abnormalities.    Features are consistent with a pseudonormal left ventricular    filling pattern, with concomitant abnormal relaxation and    increased filling pressure (grade 2 diastolic dysfunction).    Doppler parameters are consistent with elevated ventricular    end-diastolic filling pressure.  - Aortic valve: Trileaflet; normal thickness leaflets.  - Aortic root: The aortic root was normal in size.  - Mitral valve: Structurally normal valve. There was mild    regurgitation.  - Left atrium: The atrium was mildly dilated.  - Right ventricle: The cavity size was normal. Wall thickness was    normal. Systolic function was normal.  - Right atrium: The atrium was normal in size.  - Tricuspid valve: There was trivial regurgitation.  - Pulmonic valve: There was no regurgitation.  - Pulmonary arteries: Systolic pressure was within the normal    range.  - Inferior vena cava: The vessel was normal in size.  - Pericardium, extracardiac: There was no pericardial effusion.   EKG:  EKG performed 02/06/2021 reveals normal sinus rhythm with nonspecific ST abnormality.  There is T wave inversion in lead III.  Compared to a September 2022 tracing at [facility] there is no mention of left ventricular hypertrophy on the current EKGs.  Recent Labs: 02/03/2021: BUN 14; Creatinine, Ser 0.81; Hemoglobin 14.0; Platelets 312; Potassium 3.8; Sodium 135  Recent Lipid Panel No results found for: CHOL, TRIG, HDL, CHOLHDL, VLDL, LDLCALC,  LDLDIRECT  Physical Exam:    VS:  BP 110/68   Pulse 74   Ht $R'5\' 6"'oS$  (1.676 m)   Wt 199 lb 9.6 oz (90.5 kg)   LMP 06/20/2013   SpO2 95%   BMI 32.22 kg/m     Wt Readings from Last 3 Encounters:  04/02/21 199 lb 9.6 oz (90.5 kg)  02/03/21 200 lb (90.7 kg)  08/04/20 202 lb (91.6 kg)     GEN: Mildly obese. No acute distress HEENT: Normal NECK: No JVD. LYMPHATICS: No lymphadenopathy CARDIAC: Scratchy systolic murmur. RRR no  gallop, or edema. VASCULAR:  Normal Pulses. No bruits. RESPIRATORY:  Clear to auscultation without rales, wheezing or rhonchi  ABDOMEN: Soft, non-tender, non-distended, No pulsatile mass, MUSCULOSKELETAL: No deformity  SKIN: Warm and dry NEUROLOGIC:  Alert and oriented x 3 PSYCHIATRIC:  Normal affect   ASSESSMENT:    1. Chest pain of uncertain etiology   2. Shortness of breath   3. Left ventricular hypertrophy  4. Obstructive sleep apnea   5. Essential hypertension   6. Murmur    PLAN:    In order of problems listed above:  Not likely to be related to CAD.  Will not evaluate formally at this time unless the discomfort recurs. Rule out diastolic heart failure.  Repeat echocardiogram to follow-up on mild concentric hypertrophy noted in 2017. Please see above.  2D Doppler echocardiogram will be done. Discussed the importance of control relative to blood pressure control.  Encouraged that she resume CPAP. Tried to help her understand the connection between poor sleep and blood pressure elevation.  Current blood pressure is adequately treated.  Treat sleep apnea with CPAP.  2D Doppler echocardiogram to assess for LVH 2D echocardiogram.  Clinical follow-up will be determined by findings on echo and her overall clinical status.   Medication Adjustments/Labs and Tests Ordered: Current medicines are reviewed at length with the patient today.  Concerns regarding medicines are outlined above.  Orders Placed This Encounter  Procedures   ECHOCARDIOGRAM  COMPLETE    Meds ordered this encounter  Medications   rosuvastatin (CRESTOR) 10 MG tablet    Sig: Take 1 tablet (10 mg total) by mouth daily.    Dispense:  90 tablet    Refill:  3    Patient Instructions  Medication Instructions:  Your physician recommends that you continue on your current medications as directed. Please refer to the Current Medication list given to you today.  *If you need a refill on your cardiac medications before your next appointment, please call your pharmacy*   Lab Work: None If you have labs (blood work) drawn today and your tests are completely normal, you will receive your results only by: [place] (if you have MyChart) OR A paper copy in the mail If you have any lab test that is abnormal or we need to change your treatment, we will call you to review the results.   Testing/Procedures: Your physician has requested that you have an echocardiogram. Echocardiography is a painless test that uses sound waves to create images of your heart. It provides your doctor with information about the size and shape of your heart and how well your heart's chambers and valves are working. This procedure takes approximately one hour. There are no restrictions for this procedure.   Follow-Up: At [facility], you and your health needs are our priority.  As part of our continuing mission to provide you with exceptional heart care, we have created designated Provider Care Teams.  These Care Teams include your primary Cardiologist (physician) and Advanced Practice Providers (APPs -  Physician Assistants and Nurse Practitioners) who all work together to provide you with the care you need, when you need it.  We recommend signing up for the patient portal called "MyChart".  Sign up information is provided on this After Visit Summary.  MyChart is used to connect with patients for Virtual Visits (Telemedicine).  Patients are able to view lab/test results, encounter notes,  upcoming appointments, etc.  Non-urgent messages can be sent to your provider as well.   To learn more about what you can do with MyChart, go to [email or web address].    Your next appointment:   As needed  The format for your next appointment:   In Person  Provider:   Daneen Schick, III, MD    Other Instructions  Your physician recommends that you get back to using your CPAP machine.  Low-Sodium Eating Plan Sodium, which is an element that makes  up salt, helps you maintain a healthy balance of fluids in your body. Too much sodium can increase your blood pressure and cause fluid and waste to be held in your body. Your health care provider or dietitian may recommend following this plan if you have high blood pressure (hypertension), kidney disease, liver disease, or heart failure. Eating less sodium can help lower your blood pressure, reduce swelling, and protect your heart, liver, and kidneys. What are tips for following this plan? Reading food labels The Nutrition Facts label lists the amount of sodium in one serving of the food. If you eat more than one serving, you must multiply the listed amount of sodium by the number of servings. Choose foods with less than 140 mg of sodium per serving. Avoid foods with 300 mg of sodium or more per serving. Shopping  Look for lower-sodium products, often labeled as "low-sodium" or "no salt added." Always check the sodium content, even if foods are labeled as "unsalted" or "no salt added." Buy fresh foods. Avoid canned foods and pre-made or frozen meals. Avoid canned, cured, or processed meats. Buy breads that have less than 80 mg of sodium per slice. Cooking  Eat more home-cooked food and less restaurant, buffet, and fast food. Avoid adding salt when cooking. Use salt-free seasonings or herbs instead of table salt or sea salt. Check with your health care provider or pharmacist before using salt substitutes. Cook with plant-based oils, such  as canola, sunflower, or olive oil. Meal planning When eating at a restaurant, ask that your food be prepared with less salt or no salt, if possible. Avoid dishes labeled as brined, pickled, cured, smoked, or made with soy sauce, miso, or teriyaki sauce. Avoid foods that contain MSG (monosodium glutamate). MSG is sometimes added to [place] food, bouillon, and some canned foods. Make meals that can be grilled, baked, poached, roasted, or steamed. These are generally made with less sodium. General information Most people on this plan should limit their sodium intake to 1,500-2,000 mg (milligrams) of sodium each day. What foods should I eat? Fruits Fresh, frozen, or canned fruit. Fruit juice. Vegetables Fresh or frozen vegetables. "No salt added" canned vegetables. "No salt added" tomato sauce and paste. Low-sodium or reduced-sodium tomato and vegetable juice. Grains Low-sodium cereals, including oats, puffed wheat and rice, and shredded wheat. Low-sodium crackers. Unsalted rice. Unsalted pasta. Low-sodium bread. Whole-grain breads and whole-grain pasta. Meats and other proteins Fresh or frozen (no salt added) meat, poultry, seafood, and fish. Low-sodium canned tuna and salmon. Unsalted nuts. Dried peas, beans, and lentils without added salt. Unsalted canned beans. Eggs. Unsalted nut butters. Dairy Milk. Soy milk. Cheese that is naturally low in sodium, such as ricotta cheese, fresh mozzarella, or Swiss cheese. Low-sodium or reduced-sodium cheese. Cream cheese. Yogurt. Seasonings and condiments Fresh and dried herbs and spices. Salt-free seasonings. Low-sodium mustard and ketchup. Sodium-free salad dressing. Sodium-free light mayonnaise. Fresh or refrigerated horseradish. Lemon juice. Vinegar. Other foods Homemade, reduced-sodium, or low-sodium soups. Unsalted popcorn and pretzels. Low-salt or salt-free chips. The items listed above may not be a complete list of foods and beverages you can eat.  Contact a dietitian for more information. What foods should I avoid? Vegetables Sauerkraut, pickled vegetables, and relishes. Olives. [place] fries. Onion rings. Regular canned vegetables (not low-sodium or reduced-sodium). Regular canned tomato sauce and paste (not low-sodium or reduced-sodium). Regular tomato and vegetable juice (not low-sodium or reduced-sodium). Frozen vegetables in sauces. Grains Instant hot cereals. Bread stuffing, pancake, and biscuit mixes. Croutons.  Seasoned rice or pasta mixes. Noodle soup cups. Boxed or frozen macaroni and cheese. Regular salted crackers. Self-rising flour. Meats and other proteins Meat or fish that is salted, canned, smoked, spiced, or pickled. Precooked or cured meat, such as sausages or meat loaves. Berniece Salines. Ham. Pepperoni. Hot dogs. Corned beef. Chipped beef. Salt pork. Jerky. Pickled herring. Anchovies and sardines. Regular canned tuna. Salted nuts. Dairy Processed cheese and cheese spreads. Hard cheeses. Cheese curds. Blue cheese. Feta cheese. String cheese. Regular cottage cheese. Buttermilk. Canned milk. Fats and oils Salted butter. Regular margarine. Ghee. Bacon fat. Seasonings and condiments Onion salt, garlic salt, seasoned salt, table salt, and sea salt. Canned and packaged gravies. Worcestershire sauce. Tartar sauce. Barbecue sauce. Teriyaki sauce. Soy sauce, including reduced-sodium. Steak sauce. Fish sauce. Oyster sauce. Cocktail sauce. Horseradish that you find on the shelf. Regular ketchup and mustard. Meat flavorings and tenderizers. Bouillon cubes. Hot sauce. Pre-made or packaged marinades. Pre-made or packaged taco seasonings. Relishes. Regular salad dressings. Salsa. Other foods Salted popcorn and pretzels. Corn chips and puffs. Potato and tortilla chips. Canned or dried soups. Pizza. Frozen entrees and pot pies. The items listed above may not be a complete list of foods and beverages you should avoid. Contact a dietitian for more  information. Summary Eating less sodium can help lower your blood pressure, reduce swelling, and protect your heart, liver, and kidneys. Most people on this plan should limit their sodium intake to 1,500-2,000 mg (milligrams) of sodium each day. Canned, boxed, and frozen foods are high in sodium. Restaurant foods, fast foods, and pizza are also very high in sodium. You also get sodium by adding salt to food. Try to cook at home, eat more fresh fruits and vegetables, and eat less fast food and canned, processed, or prepared foods. This information is not intended to replace advice given to you by your health care provider. Make sure you discuss any questions you have with your health care provider. Document Revised: 05/28/2019 Document Reviewed: 03/24/2019 Elsevier Patient Education  2022 [place], Sinclair Grooms, MD  04/02/2021 10:28 AM    [place]

## 2021-04-02 ENCOUNTER — Other Ambulatory Visit: Payer: Self-pay

## 2021-04-02 ENCOUNTER — Encounter: Payer: Self-pay | Admitting: Interventional Cardiology

## 2021-04-02 ENCOUNTER — Ambulatory Visit (INDEPENDENT_AMBULATORY_CARE_PROVIDER_SITE_OTHER): Payer: Managed Care, Other (non HMO) | Admitting: Interventional Cardiology

## 2021-04-02 VITALS — BP 110/68 | HR 74 | Ht 66.0 in | Wt 199.6 lb

## 2021-04-02 DIAGNOSIS — G4733 Obstructive sleep apnea (adult) (pediatric): Secondary | ICD-10-CM | POA: Diagnosis not present

## 2021-04-02 DIAGNOSIS — R011 Cardiac murmur, unspecified: Secondary | ICD-10-CM

## 2021-04-02 DIAGNOSIS — I10 Essential (primary) hypertension: Secondary | ICD-10-CM

## 2021-04-02 DIAGNOSIS — R0602 Shortness of breath: Secondary | ICD-10-CM

## 2021-04-02 DIAGNOSIS — I517 Cardiomegaly: Secondary | ICD-10-CM

## 2021-04-02 DIAGNOSIS — R079 Chest pain, unspecified: Secondary | ICD-10-CM

## 2021-04-02 MED ORDER — ROSUVASTATIN CALCIUM 10 MG PO TABS: 10.0000 mg | ORAL_TABLET | Freq: Every day | ORAL | 3 refills | Status: DC

## 2021-04-02 NOTE — Patient Instructions (Signed)
Medication Instructions:  Your physician recommends that you continue on your current medications as directed. Please refer to the Current Medication list given to you today.  *If you need a refill on your cardiac medications before your next appointment, please call your pharmacy*   Lab Work: None If you have labs (blood work) drawn today and your tests are completely normal, you will receive your results only by: [place] (if you have MyChart) OR A paper copy in the mail If you have any lab test that is abnormal or we need to change your treatment, we will call you to review the results.   Testing/Procedures: Your physician has requested that you have an echocardiogram. Echocardiography is a painless test that uses sound waves to create images of your heart. It provides your doctor with information about the size and shape of your heart and how well your heart's chambers and valves are working. This procedure takes approximately one hour. There are no restrictions for this procedure.   Follow-Up: At [facility], you and your health needs are our priority.  As part of our continuing mission to provide you with exceptional heart care, we have created designated Provider Care Teams.  These Care Teams include your primary Cardiologist (physician) and Advanced Practice Providers (APPs -  Physician Assistants and Nurse Practitioners) who all work together to provide you with the care you need, when you need it.  We recommend signing up for the patient portal called "MyChart".  Sign up information is provided on this After Visit Summary.  MyChart is used to connect with patients for Virtual Visits (Telemedicine).  Patients are able to view lab/test results, encounter notes, upcoming appointments, etc.  Non-urgent messages can be sent to your provider as well.   To learn more about what you can do with MyChart, go to [email or web address].    Your next appointment:   As needed  The  format for your next appointment:   In Person  Provider:   Daneen Schick, III, MD    Other Instructions  Your physician recommends that you get back to using your CPAP machine.  Low-Sodium Eating Plan Sodium, which is an element that makes up salt, helps you maintain a healthy balance of fluids in your body. Too much sodium can increase your blood pressure and cause fluid and waste to be held in your body. Your health care provider or dietitian may recommend following this plan if you have high blood pressure (hypertension), kidney disease, liver disease, or heart failure. Eating less sodium can help lower your blood pressure, reduce swelling, and protect your heart, liver, and kidneys. What are tips for following this plan? Reading food labels The Nutrition Facts label lists the amount of sodium in one serving of the food. If you eat more than one serving, you must multiply the listed amount of sodium by the number of servings. Choose foods with less than 140 mg of sodium per serving. Avoid foods with 300 mg of sodium or more per serving. Shopping  Look for lower-sodium products, often labeled as "low-sodium" or "no salt added." Always check the sodium content, even if foods are labeled as "unsalted" or "no salt added." Buy fresh foods. Avoid canned foods and pre-made or frozen meals. Avoid canned, cured, or processed meats. Buy breads that have less than 80 mg of sodium per slice. Cooking  Eat more home-cooked food and less restaurant, buffet, and fast food. Avoid adding salt when cooking. Use salt-free seasonings or herbs instead  of table salt or sea salt. Check with your health care provider or pharmacist before using salt substitutes. Cook with plant-based oils, such as canola, sunflower, or olive oil. Meal planning When eating at a restaurant, ask that your food be prepared with less salt or no salt, if possible. Avoid dishes labeled as brined, pickled, cured, smoked, or made  with soy sauce, miso, or teriyaki sauce. Avoid foods that contain MSG (monosodium glutamate). MSG is sometimes added to [place] food, bouillon, and some canned foods. Make meals that can be grilled, baked, poached, roasted, or steamed. These are generally made with less sodium. General information Most people on this plan should limit their sodium intake to 1,500-2,000 mg (milligrams) of sodium each day. What foods should I eat? Fruits Fresh, frozen, or canned fruit. Fruit juice. Vegetables Fresh or frozen vegetables. "No salt added" canned vegetables. "No salt added" tomato sauce and paste. Low-sodium or reduced-sodium tomato and vegetable juice. Grains Low-sodium cereals, including oats, puffed wheat and rice, and shredded wheat. Low-sodium crackers. Unsalted rice. Unsalted pasta. Low-sodium bread. Whole-grain breads and whole-grain pasta. Meats and other proteins Fresh or frozen (no salt added) meat, poultry, seafood, and fish. Low-sodium canned tuna and salmon. Unsalted nuts. Dried peas, beans, and lentils without added salt. Unsalted canned beans. Eggs. Unsalted nut butters. Dairy Milk. Soy milk. Cheese that is naturally low in sodium, such as ricotta cheese, fresh mozzarella, or Swiss cheese. Low-sodium or reduced-sodium cheese. Cream cheese. Yogurt. Seasonings and condiments Fresh and dried herbs and spices. Salt-free seasonings. Low-sodium mustard and ketchup. Sodium-free salad dressing. Sodium-free light mayonnaise. Fresh or refrigerated horseradish. Lemon juice. Vinegar. Other foods Homemade, reduced-sodium, or low-sodium soups. Unsalted popcorn and pretzels. Low-salt or salt-free chips. The items listed above may not be a complete list of foods and beverages you can eat. Contact a dietitian for more information. What foods should I avoid? Vegetables Sauerkraut, pickled vegetables, and relishes. Olives. [place] fries. Onion rings. Regular canned vegetables (not low-sodium or  reduced-sodium). Regular canned tomato sauce and paste (not low-sodium or reduced-sodium). Regular tomato and vegetable juice (not low-sodium or reduced-sodium). Frozen vegetables in sauces. Grains Instant hot cereals. Bread stuffing, pancake, and biscuit mixes. Croutons. Seasoned rice or pasta mixes. Noodle soup cups. Boxed or frozen macaroni and cheese. Regular salted crackers. Self-rising flour. Meats and other proteins Meat or fish that is salted, canned, smoked, spiced, or pickled. Precooked or cured meat, such as sausages or meat loaves. Berniece Salines. Ham. Pepperoni. Hot dogs. Corned beef. Chipped beef. Salt pork. Jerky. Pickled herring. Anchovies and sardines. Regular canned tuna. Salted nuts. Dairy Processed cheese and cheese spreads. Hard cheeses. Cheese curds. Blue cheese. Feta cheese. String cheese. Regular cottage cheese. Buttermilk. Canned milk. Fats and oils Salted butter. Regular margarine. Ghee. Bacon fat. Seasonings and condiments Onion salt, garlic salt, seasoned salt, table salt, and sea salt. Canned and packaged gravies. Worcestershire sauce. Tartar sauce. Barbecue sauce. Teriyaki sauce. Soy sauce, including reduced-sodium. Steak sauce. Fish sauce. Oyster sauce. Cocktail sauce. Horseradish that you find on the shelf. Regular ketchup and mustard. Meat flavorings and tenderizers. Bouillon cubes. Hot sauce. Pre-made or packaged marinades. Pre-made or packaged taco seasonings. Relishes. Regular salad dressings. Salsa. Other foods Salted popcorn and pretzels. Corn chips and puffs. Potato and tortilla chips. Canned or dried soups. Pizza. Frozen entrees and pot pies. The items listed above may not be a complete list of foods and beverages you should avoid. Contact a dietitian for more information. Summary Eating less sodium can help lower your blood  pressure, reduce swelling, and protect your heart, liver, and kidneys. Most people on this plan should limit their sodium intake to 1,500-2,000 mg  (milligrams) of sodium each day. Canned, boxed, and frozen foods are high in sodium. Restaurant foods, fast foods, and pizza are also very high in sodium. You also get sodium by adding salt to food. Try to cook at home, eat more fresh fruits and vegetables, and eat less fast food and canned, processed, or prepared foods. This information is not intended to replace advice given to you by your health care provider. Make sure you discuss any questions you have with your health care provider. Document Revised: 05/28/2019 Document Reviewed: 03/24/2019 Elsevier Patient Education  2022 [employer].

## 2021-04-19 ENCOUNTER — Ambulatory Visit (HOSPITAL_COMMUNITY): Payer: Managed Care, Other (non HMO) | Attending: Cardiology

## 2021-04-19 ENCOUNTER — Other Ambulatory Visit: Payer: Self-pay

## 2021-04-19 DIAGNOSIS — R079 Chest pain, unspecified: Secondary | ICD-10-CM | POA: Diagnosis present

## 2021-04-19 DIAGNOSIS — R011 Cardiac murmur, unspecified: Secondary | ICD-10-CM | POA: Diagnosis present

## 2021-04-19 DIAGNOSIS — I10 Essential (primary) hypertension: Secondary | ICD-10-CM

## 2021-04-19 DIAGNOSIS — R0602 Shortness of breath: Secondary | ICD-10-CM | POA: Insufficient documentation

## 2021-04-19 LAB — ECHOCARDIOGRAM COMPLETE
Area-P 1/2: 2.09 cm2
S' Lateral: 3.1 cm

## 2021-06-01 ENCOUNTER — Encounter: Payer: Managed Care, Other (non HMO) | Admitting: Family Medicine

## 2021-06-04 ENCOUNTER — Encounter: Payer: Self-pay | Admitting: Family Medicine

## 2021-06-04 ENCOUNTER — Other Ambulatory Visit (HOSPITAL_COMMUNITY)
Admission: RE | Admit: 2021-06-04 | Discharge: 2021-06-04 | Disposition: A | Discharge status: Home / Self Care | Payer: Managed Care, Other (non HMO) | Source: Ambulatory Visit | Attending: Family Medicine | Admitting: Family Medicine

## 2021-06-04 ENCOUNTER — Ambulatory Visit (INDEPENDENT_AMBULATORY_CARE_PROVIDER_SITE_OTHER): Payer: Managed Care, Other (non HMO) | Admitting: Family Medicine

## 2021-06-04 VITALS — BP 106/68 | HR 72 | Temp 98.1°F | Resp 18 | Ht 66.0 in | Wt 202.0 lb

## 2021-06-04 DIAGNOSIS — E785 Hyperlipidemia, unspecified: Secondary | ICD-10-CM | POA: Diagnosis not present

## 2021-06-04 DIAGNOSIS — G4733 Obstructive sleep apnea (adult) (pediatric): Secondary | ICD-10-CM

## 2021-06-04 DIAGNOSIS — Z1159 Encounter for screening for other viral diseases: Secondary | ICD-10-CM

## 2021-06-04 DIAGNOSIS — I10 Essential (primary) hypertension: Secondary | ICD-10-CM

## 2021-06-04 DIAGNOSIS — Z0000 Encounter for general adult medical examination without abnormal findings: Secondary | ICD-10-CM | POA: Diagnosis present

## 2021-06-04 DIAGNOSIS — R413 Other amnesia: Secondary | ICD-10-CM

## 2021-06-04 NOTE — Progress Notes (Signed)
Subjective:     Ariana Cohen is a 61 y.o. female and is here for a comprehensive physical exam. 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   Occupation: [employer]    Comment: [place]  Tobacco Use   Smoking status: Never   Smokeless tobacco: Never  Vaping Use   Vaping Use: Never used  Substance and Sexual Activity   Alcohol use: Yes    Comment: rarely   Drug use: No   Sexual activity: Yes  Other Topics Concern   Not on file  Social History Narrative   epworth sleepiness scale = 18 (08/11/15)   Social Determinants of Health   Financial Resource Strain: Not on file  Food Insecurity: Not on file  Transportation Needs: Not on file  Physical Activity: Not on file  Stress: Not on file  Social Connections: Not on file  Intimate Partner Violence: Not on file   Health Maintenance  Topic Date Due   Hepatitis C Screening  Never done   Zoster Vaccines- Shingrix (1 of 2) Never done   COVID-19 Vaccine (3 - Moderna risk series) 09/22/2019   HIV Screening  06/04/2022 (Originally 09/25/1975)   PAP SMEAR-Modifier  03/04/2022   MAMMOGRAM  06/09/2022   COLONOSCOPY (Pts 45-56yrs Insurance coverage will need to be confirmed)  08/05/2023   TETANUS/TDAP  09/26/2027   INFLUENZA VACCINE  Completed   HPV VACCINES  Aged Out    The following portions of the patient's history were reviewed and updated as appropriate: She  has a past medical history of Allergy, Anxiety, Blood transfusion without reported diagnosis (2000), Depression, Fibroids, Heart murmur, Hypertension, Sleep apnea, and Urticaria. She does not have any pertinent problems on file. She   has a past surgical history that includes Myomectomy (1992); Cesarean section (1998; 2000); and Breast biopsy (2019). Her family history includes Asthma in her daughter; Healthy in her daughter and son; Heart attack in her maternal grandmother; Heart disease in her father; Hypertension in her father, maternal grandfather, maternal grandmother, mother, and sister; Multiple myeloma in her maternal grandfather; Renal Disease in her mother; Schizophrenia in her mother; Sleep apnea in her maternal grandfather; Thyroid disease in her maternal grandmother. She  reports that she has never smoked. She has never used smokeless tobacco. She reports current alcohol use. She reports that she does not use drugs. She has a current medication list which includes the following prescription(s): fexofenadine, multivitamin, rosuvastatin, spironolactone, and telmisartan-hydrochlorothiazide. Current Outpatient Medications on File Prior to Visit  Medication Sig Dispense Refill   fexofenadine (ALLEGRA) 60 MG tablet Take by mouth.     Multiple Vitamin (MULTIVITAMIN) capsule Take 1 capsule by mouth daily.     rosuvastatin (CRESTOR) 10 MG tablet Take 1 tablet (10 mg total) by mouth daily. 90 tablet 3   spironolactone (ALDACTONE) 25 MG tablet Take 1 tablet (25 mg total) by mouth daily. 90 tablet 1   telmisartan-hydrochlorothiazide (MICARDIS HCT) 80-25 MG tablet TAKE 1 TABLET BY MOUTH EVERY DAY 90 tablet 0   No current facility-administered medications on file prior to visit.   She is  allergic to norvasc [amlodipine besylate], aspirin, colchicine, metoprolol, minoxidil, and other..  Review of Systems Review of Systems  Constitutional: Negative for activity change, appetite change and fatigue.  HENT: Negative for hearing loss, congestion, tinnitus and ear discharge.  dentist q67m Eyes: Negative for visual disturbance (see optho q1y -- vision corrected to 20/20 with glasses).  Respiratory: Negative for cough, chest tightness  and shortness of breath.   Cardiovascular: Negative for chest pain, palpitations and leg swelling.  Gastrointestinal: Negative for abdominal pain, diarrhea, constipation and abdominal distention.  Genitourinary: Negative for urgency, frequency, decreased urine volume and difficulty urinating.  Musculoskeletal: Negative for back pain, arthralgias and gait problem.  Skin: Negative for color change, pallor and rash.  Neurological: Negative for dizziness, light-headedness, numbness and headaches.  Hematological: Negative for adenopathy. Does not bruise/bleed easily.  Psychiatric/Behavioral: Negative for suicidal ideas, confusion, sleep disturbance, self-injury, dysphoric mood, decreased concentration and agitation.      Objective:    BP 106/68 (BP Location: Left Arm, Patient Position: Sitting, Cuff Size: Normal)    Pulse 72    Temp 98.1 F (36.7 C) (Oral)    Resp 18    Ht $R'5\' 6"'uY$  (1.676 m)    Wt 202 lb (91.6 kg)    LMP 06/20/2013    SpO2 96%    BMI 32.60 kg/m  General appearance: alert, cooperative, appears stated age, and no distress Head: Normocephalic, without obvious abnormality, atraumatic Eyes: conjunctivae/corneas clear. PERRL, EOM's intact. Fundi benign. Ears: normal TM's and external ear canals both ears Nose: Nares normal. Septum midline. Mucosa normal. No drainage or sinus tenderness. Throat: lips, mucosa, and tongue normal; teeth and gums normal Neck: no adenopathy, no carotid bruit, no JVD, supple, symmetrical, trachea midline, and thyroid not enlarged, symmetric, no tenderness/mass/nodules Back: symmetric, no curvature. ROM normal. No CVA tenderness. Lungs: clear to auscultation bilaterally and normal percussion bilaterally Breasts: normal appearance, no masses or tenderness Heart: regular rate and rhythm, S1, S2 normal, no murmur, click, rub or gallop Abdomen: soft, non-tender; bowel sounds normal; no masses,  no organomegaly Pelvic: cervix normal in appearance, external  genitalia normal, no adnexal masses or tenderness, no cervical motion tenderness, rectovaginal septum normal, uterus normal size, shape, and consistency, and vagina normal without discharge---- pap done Extremities: extremities normal, atraumatic, no cyanosis or edema Pulses: 2+ and symmetric Skin: Skin color, texture, turgor normal. No rashes or lesions Lymph nodes: Cervical, supraclavicular, and axillary nodes normal. Neurologic: Alert and oriented X 3, normal strength and tone. Normal symmetric reflexes. Normal coordination and gait    Assessment:    Healthy female exam.      Plan:    Ghm utd Check labs  See After Visit Summary for Counseling Recommendations   1. Preventative health care See above  - CBC with Differential/Platelet; Future - Comprehensive metabolic panel; Future - Lipid panel; Future - TSH; Future - Hemoglobin A1c; Future - Cytology - PAP( [place])  2. Primary hypertension Well controlled, no changes to meds. Encouraged heart healthy diet such as the DASH diet and exercise as tolerated.   - CBC with Differential/Platelet; Future - Comprehensive metabolic panel; Future - Lipid panel; Future - TSH; Future  3. Hyperlipidemia, unspecified hyperlipidemia type Encourage heart healthy diet such as MIND or DASH diet, increase exercise, avoid trans fats, simple carbohydrates and processed foods, consider a krill or fish or flaxseed oil cap daily.  - CBC with Differential/Platelet; Future - Comprehensive metabolic panel; Future - Lipid panel; Future - TSH; Future  4. Need  for hepatitis C screening test  - Hepatitis C antibody; Future  5. Obstructive sleep apnea syndrome   - Ambulatory referral to Pulmonology  6. Memory loss  - Ambulatory referral to Neuropsychology

## 2021-06-04 NOTE — Patient Instructions (Signed)

## 2021-06-05 ENCOUNTER — Other Ambulatory Visit: Payer: Managed Care, Other (non HMO)

## 2021-06-06 LAB — CYTOLOGY - PAP: Diagnosis: NEGATIVE

## 2021-08-31 ENCOUNTER — Encounter: Payer: Self-pay | Admitting: Family Medicine

## 2021-08-31 ENCOUNTER — Ambulatory Visit: Payer: Managed Care, Other (non HMO) | Admitting: Family Medicine

## 2021-08-31 VITALS — BP 110/70 | HR 72 | Temp 97.8°F | Resp 18 | Ht 66.0 in | Wt 200.0 lb

## 2021-08-31 DIAGNOSIS — R229 Localized swelling, mass and lump, unspecified: Secondary | ICD-10-CM

## 2021-08-31 DIAGNOSIS — I10 Essential (primary) hypertension: Secondary | ICD-10-CM | POA: Diagnosis not present

## 2021-08-31 DIAGNOSIS — E785 Hyperlipidemia, unspecified: Secondary | ICD-10-CM

## 2021-08-31 NOTE — Progress Notes (Signed)
? ?Subjective:  ? ?By signing my name below, I, Zite Okoli, attest that this documentation has been prepared under the direction and in the presence of Ann Held, DO. 08/31/2021   ? ? Patient ID: Ariana Cohen, female    DOB: [birth year], 61 y.o.   MRN: [id number] ? ?Chief Complaint  ?Patient presents with  ? Mass  ?  Pt states she doesn't remember when she noticed the the bump. Bump is on her forehead. No pain, no discharge.   ? ? ?HPI ?Patient is in today for an office visit. ? ?She complains of a  bump on her forehead. It is non-tender. She does not remember hitting her head. It has been present for months but she is not sure if it is getting bigger. She adds it is "moving" and is annoying. She used to go to a dermatologist. ? ?Past Medical History:  ?Diagnosis Date  ? Allergy   ? Anxiety   ? Blood transfusion without reported diagnosis 2000  ? Depression   ? Fibroids   ? Heart murmur   ? Hypertension   ? Sleep apnea   ? has cpap doesn't use all the time  ? Urticaria   ? ? ?Past Surgical History:  ?Procedure Laterality Date  ? BREAST BIOPSY  2019  ? [place]; 2000  ? MYOMECTOMY  1992  ? ? ?Family History  ?Problem Relation Age of Onset  ? Hypertension Mother   ? Renal Disease Mother   ? Schizophrenia Mother   ? Hypertension Father   ? Heart disease Father   ?     also OSA  ? Hypertension Maternal Grandmother   ? Heart attack Maternal Grandmother   ? Thyroid disease Maternal Grandmother   ?     thyroidectomy  ? Hypertension Maternal Grandfather   ? Multiple myeloma Maternal Grandfather   ? Sleep apnea Maternal Grandfather   ? Hypertension Sister   ? Healthy Son   ? Asthma Daughter   ? Healthy Daughter   ? Colon cancer Neg Hx   ? Esophageal cancer Neg Hx   ? Stomach cancer Neg Hx   ? Rectal cancer Neg Hx   ? ? ?Social History  ? ?Socioeconomic History  ? Marital status: Unknown  ?  Spouse name: Not on file  ? Number of children: Not on file  ? Years of education: Not on file  ? Highest  education level: Not on file  ?Occupational History  ?  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 Occupation: [employer]  ?  Comment: [place]  ?Tobacco Use  ? Smoking status: Never  ? Smokeless tobacco: Never  ?Vaping Use  ? Vaping Use: Never used  ?Substance and Sexual Activity  ? Alcohol use: Yes  ?  Comment: rarely  ? Drug use: No  ? Sexual activity: Yes  ?Other Topics Concern  ? Not on file  ?Social History Narrative  ? epworth sleepiness scale = 18 (08/11/15)  ? ?Social Determinants of Health  ? ?Financial Resource Strain: Not on file  ?Food Insecurity: Not on file  ?Transportation Needs: Not on file  ?Physical Activity: Not on file  ?Stress: Not on file  ?Social Connections: Not on file  ?Intimate Partner Violence: Not on file  ? ? ?Outpatient Medications Prior to Visit  ?Medication Sig Dispense Refill  ? fexofenadine (ALLEGRA) 60 MG tablet Take by mouth.    ? Multiple Vitamin (MULTIVITAMIN) capsule Take 1 capsule by mouth daily.    ?  rosuvastatin (CRESTOR) 10 MG tablet Take 1 tablet (10 mg total) by mouth daily. 90 tablet 3  ? spironolactone (ALDACTONE) 25 MG tablet Take 1 tablet (25 mg total) by mouth daily. 90 tablet 1  ? telmisartan-hydrochlorothiazide (MICARDIS HCT) 80-25 MG tablet TAKE 1 TABLET BY MOUTH EVERY DAY 90 tablet 0  ? ?No facility-administered medications prior to visit.  ? ? ?Allergies  ?Allergen Reactions  ? Norvasc [Amlodipine Besylate] Swelling  ? Aspirin Hives  ? Colchicine   ?  Diarrhea   ? Metoprolol Other (See Comments)  ?  Fatigue, somnolence ?  ? Minoxidil Other (See Comments)  ?  Weight gain, extreme edema  Legs, feet, hands, lethargic ?   ? Other Other (See Comments)  ? ? ?Review of Systems  ?Constitutional:  Negative for fever.  ?HENT:  Negative for congestion, ear pain, hearing loss, sinus pain and sore throat.   ?Eyes:  Negative for blurred vision and pain.  ?Respiratory:  Negative for cough, sputum production, shortness of breath and wheezing.   ?Cardiovascular:  Negative for chest pain  and palpitations.  ?Gastrointestinal:  Negative for blood in stool, constipation, diarrhea, nausea and vomiting.  ?Genitourinary:  Negative for dysuria, frequency, hematuria and urgency.  ?Musculoskeletal:  Negative for back pain, falls and myalgias.  ?Skin:   ?     (+) bump on forehead  ?Neurological:  Negative for dizziness, sensory change, loss of consciousness, weakness and headaches.  ?Endo/Heme/Allergies:  Negative for environmental allergies. Does not bruise/bleed easily.  ?Psychiatric/Behavioral:  Negative for depression and suicidal ideas. The patient is not nervous/anxious and does not have insomnia.   ? ?   ?Objective:  ?  ?Physical Exam ?Vitals and nursing note reviewed.  ?Constitutional:   ?   General: She is not in acute distress. ?   Appearance: Normal appearance. She is not ill-appearing.  ?HENT:  ?   Head: Normocephalic and atraumatic.  ?   Right Ear: External ear normal.  ?   Left Ear: External ear normal.  ?Eyes:  ?   Extraocular Movements: Extraocular movements intact.  ?   Pupils: Pupils are equal, round, and reactive to light.  ?Cardiovascular:  ?   Rate and Rhythm: Normal rate and regular rhythm.  ?   Pulses: Normal pulses.  ?   Heart sounds: Normal heart sounds. No murmur heard. ?  No gallop.  ?Pulmonary:  ?   Effort: Pulmonary effort is normal. No respiratory distress.  ?   Breath sounds: Normal breath sounds. No wheezing, rhonchi or rales.  ?Abdominal:  ?   General: Bowel sounds are normal. There is no distension.  ?   Palpations: Abdomen is soft. There is no mass.  ?   Tenderness: There is no abdominal tenderness. There is no guarding or rebound.  ?   Hernia: No hernia is present.  ?Musculoskeletal:  ?   Cervical back: Normal range of motion and neck supple.  ?Lymphadenopathy:  ?   Cervical: No cervical adenopathy.  ?Skin: ?   General: Skin is warm and dry.  ?   Comments: Hard mobile nodule under skin on forehead---  lipoma or cyst? ??  ?Neurological:  ?   Mental Status: She is alert and  oriented to person, place, and time.  ?Psychiatric:     ?   Behavior: Behavior normal.  ? ? ?BP 110/70 (BP Location: Right Arm, Patient Position: Sitting, Cuff Size: Normal)   Pulse 72   Temp 97.8 ?F (36.6 ?C) (Oral)  Resp 18   Ht $R'5\' 6"'QC$  (1.676 m)   Wt 200 lb (90.7 kg)   LMP 06/20/2013   SpO2 97%   BMI 32.28 kg/m?  ?Wt Readings from Last 3 Encounters:  ?08/31/21 200 lb (90.7 kg)  ?06/04/21 202 lb (91.6 kg)  ?04/02/21 199 lb 9.6 oz (90.5 kg)  ? ? ?Diabetic Foot Exam - Simple   ?No data filed ?  ? ?Lab Results  ?Component Value Date  ? WBC 7.7 02/03/2021  ? HGB 14.0 02/03/2021  ? HCT 45.3 02/03/2021  ? PLT 312 02/03/2021  ? GLUCOSE 91 02/03/2021  ? ALT 25 04/01/2018  ? AST 24 04/01/2018  ? NA 135 02/03/2021  ? K 3.8 02/03/2021  ? CL 101 02/03/2021  ? CREATININE 0.81 02/03/2021  ? BUN 14 02/03/2021  ? CO2 26 02/03/2021  ? TSH 0.64 10/31/2016  ? HGBA1C 6.1 (H) 09/20/2017  ? ? ?Lab Results  ?Component Value Date  ? TSH 0.64 10/31/2016  ? ?Lab Results  ?Component Value Date  ? WBC 7.7 02/03/2021  ? HGB 14.0 02/03/2021  ? HCT 45.3 02/03/2021  ? MCV 87.1 02/03/2021  ? PLT 312 02/03/2021  ? ?Lab Results  ?Component Value Date  ? NA 135 02/03/2021  ? K 3.8 02/03/2021  ? CO2 26 02/03/2021  ? GLUCOSE 91 02/03/2021  ? BUN 14 02/03/2021  ? CREATININE 0.81 02/03/2021  ? BILITOT 0.5 04/01/2018  ? ALKPHOS 112 10/31/2016  ? AST 24 04/01/2018  ? ALT 25 04/01/2018  ? PROT 7.2 04/01/2018  ? ALBUMIN 4.0 10/31/2016  ? CALCIUM 9.4 02/03/2021  ? ANIONGAP 8 02/03/2021  ? ?No results found for: CHOL ?No results found for: HDL ?No results found for: [place] ?No results found for: TRIG ?No results found for: CHOLHDL ?Lab Results  ?Component Value Date  ? HGBA1C 6.1 (H) 09/20/2017  ? ? ?   ?Assessment & Plan:  ? ?Problem List Items Addressed This Visit   ? ?  ? Unprioritized  ? Skin nodule - Primary  ?  Non tender  ?prob benign--- pt requesting referral  ? ?  ?  ? Relevant Orders  ? Ambulatory referral to Dermatology  ? ?Other Visit  Diagnoses   ? ? Primary hypertension      ? Relevant Orders  ? CBC with Differential/Platelet  ? Comprehensive metabolic panel  ? Lipid panel  ? TSH  ? Hyperlipidemia, unspecified hyperlipidemia type      ? Relevant O

## 2021-08-31 NOTE — Assessment & Plan Note (Signed)
Non tender  ?prob benign--- pt requesting referral  ?

## 2021-09-01 LAB — CBC WITH DIFFERENTIAL/PLATELET
Absolute Monocytes: 564 cells/uL (ref 200–950)
Basophils Absolute: 17 cells/uL (ref 0–200)
Basophils Relative: 0.2 %
Eosinophils Absolute: 125 cells/uL (ref 15–500)
Eosinophils Relative: 1.5 %
HCT: 45.1 % — ABNORMAL HIGH (ref 35.0–45.0)
Hemoglobin: 14.7 g/dL (ref 11.7–15.5)
Lymphs Abs: 1776 cells/uL (ref 850–3900)
MCH: 27.4 pg (ref 27.0–33.0)
MCHC: 32.6 g/dL (ref 32.0–36.0)
MCV: 84 fL (ref 80.0–100.0)
MPV: 9.4 fL (ref 7.5–12.5)
Monocytes Relative: 6.8 %
Neutro Abs: 5818 cells/uL (ref 1500–7800)
Neutrophils Relative %: 70.1 %
Platelets: 326 10*3/uL (ref 140–400)
RBC: 5.37 10*6/uL — ABNORMAL HIGH (ref 3.80–5.10)
RDW: 13.2 % (ref 11.0–15.0)
Total Lymphocyte: 21.4 %
WBC: 8.3 10*3/uL (ref 3.8–10.8)

## 2021-09-01 LAB — LIPID PANEL
Cholesterol: 145 mg/dL (ref <200)
HDL: 50 mg/dL (ref >=50)
LDL Cholesterol (Calc): 81 mg/dL (calc)
Non-HDL Cholesterol (Calc): 95 mg/dL (calc) (ref <130)
Total CHOL/HDL Ratio: 2.9 (calc) (ref <5.0)
Triglycerides: 62 mg/dL (ref <150)

## 2021-09-01 LAB — TSH: TSH: 1.22 mIU/L (ref 0.40–4.50)

## 2021-09-01 LAB — COMPREHENSIVE METABOLIC PANEL
AG Ratio: 1.3 (calc) (ref 1.0–2.5)
ALT: 28 U/L (ref 6–29)
AST: 26 U/L (ref 10–35)
Albumin: 4 g/dL (ref 3.6–5.1)
Alkaline phosphatase (APISO): 120 U/L (ref 37–153)
BUN: 12 mg/dL (ref 7–25)
CO2: 25 mmol/L (ref 20–32)
Calcium: 10.1 mg/dL (ref 8.6–10.4)
Chloride: 103 mmol/L (ref 98–110)
Creat: 0.81 mg/dL (ref 0.50–1.05)
Globulin: 3 g/dL (calc) (ref 1.9–3.7)
Glucose, Bld: 90 mg/dL (ref 65–99)
Potassium: 4 mmol/L (ref 3.5–5.3)
Sodium: 139 mmol/L (ref 135–146)
Total Bilirubin: 0.6 mg/dL (ref 0.2–1.2)
Total Protein: 7 g/dL (ref 6.1–8.1)

## 2021-10-09 ENCOUNTER — Telehealth: Payer: Self-pay | Admitting: Family Medicine

## 2021-10-09 NOTE — Telephone Encounter (Signed)
Patient is calling to see if Etter Sjogren can write her a letter stating she is medically healthy to be part of the [facility]. This study focuses on reducing diabetes, heart issues, etc on African American woman. She states that as part of the study they will just be watching her daily food intake etc. Please advise.

## 2021-10-09 NOTE — Telephone Encounter (Signed)
Letter printed and placed upfront per patient request

## 2021-11-27 ENCOUNTER — Ambulatory Visit: Payer: Managed Care, Other (non HMO) | Admitting: Family Medicine

## 2021-12-12 ENCOUNTER — Encounter (INDEPENDENT_AMBULATORY_CARE_PROVIDER_SITE_OTHER): Payer: Self-pay

## 2022-01-24 ENCOUNTER — Telehealth: Payer: Self-pay | Admitting: Interventional Cardiology

## 2022-01-24 MED ORDER — SPIRONOLACTONE 50 MG PO TABS: 50.0000 mg | ORAL_TABLET | Freq: Every day | ORAL | 1 refills | Status: DC

## 2022-01-24 NOTE — Telephone Encounter (Signed)
Per chart we have not filled these meds since 2020.  [facility] patient has received refills from Dr. Maia Plan as recently as  Micardis  01/19/22 Spironolactone 12/21/21   LOV with Dr. Tamala Julian states patient is to follow up as needed. Please advise on refill request. Thanks!

## 2022-01-24 NOTE — Telephone Encounter (Signed)
LVM to advise patient of message  Per pharmacy patient received #90 Micardis on 01/19/22, so she should not need a refill on this med. Refill on Spironolactone sent, #30, 1 rf.

## 2022-01-24 NOTE — Telephone Encounter (Signed)
*  STAT* If patient is at the pharmacy, call can be transferred to refill team.   1. Which medications need to be refilled? (please list name of each medication and dose if known)   spironolactone (ALDACTONE) 25 MG tablet telmisartan-hydrochlorothiazide (MICARDIS HCT) 80-25 MG tablet  2. Which pharmacy/location (including street and city if local pharmacy) is medication to be sent to? [id number] [place], [place] - [address]  3. Do they need a 30 day or 90 day supply? 90 day

## 2022-01-24 NOTE — Telephone Encounter (Signed)
OK to give 30 days with [facility] - Dr Thompson Caul last office visit states for pt to continue current medication.  If she wants to continue cardiac follow up with Dr Tamala Julian and to obtain medication refills she will need to schedule a yearly follow up visit.

## 2022-01-24 NOTE — Addendum Note (Signed)
Addended by: Wadie Lessen on: 01/24/2022 01:49 PM   Modules accepted: Orders

## 2022-01-24 NOTE — Telephone Encounter (Signed)
[facility] these medication are most recently prescribed by Dr. Elvera Bicker Scott/[facility]. Patient will need to contact that office for additional refills.   LVM to advise.

## 2022-01-24 NOTE — Telephone Encounter (Signed)
Patient is following up. She states she switched from [facility] to Dr. Tamala Julian.

## 2022-01-29 ENCOUNTER — Ambulatory Visit: Payer: Managed Care, Other (non HMO) | Admitting: Family Medicine

## 2022-01-29 ENCOUNTER — Encounter: Payer: Self-pay | Admitting: Family Medicine

## 2022-01-29 VITALS — BP 126/84 | HR 60 | Temp 97.7°F | Resp 18 | Ht 66.0 in | Wt 207.8 lb

## 2022-01-29 DIAGNOSIS — Z23 Encounter for immunization: Secondary | ICD-10-CM

## 2022-01-29 DIAGNOSIS — N3281 Overactive bladder: Secondary | ICD-10-CM

## 2022-01-29 DIAGNOSIS — F419 Anxiety disorder, unspecified: Secondary | ICD-10-CM

## 2022-01-29 DIAGNOSIS — E785 Hyperlipidemia, unspecified: Secondary | ICD-10-CM | POA: Diagnosis not present

## 2022-01-29 DIAGNOSIS — I10 Essential (primary) hypertension: Secondary | ICD-10-CM

## 2022-01-29 DIAGNOSIS — F411 Generalized anxiety disorder: Secondary | ICD-10-CM

## 2022-01-29 MED ORDER — ESCITALOPRAM OXALATE 10 MG PO TABS: 10.0000 mg | ORAL_TABLET | Freq: Every day | ORAL | 2 refills | Status: DC

## 2022-01-29 MED ORDER — SOLIFENACIN SUCCINATE 5 MG PO TABS: 5.0000 mg | ORAL_TABLET | Freq: Every day | ORAL | 1 refills | Status: DC

## 2022-01-29 NOTE — Patient Instructions (Signed)

## 2022-01-31 DIAGNOSIS — N3281 Overactive bladder: Secondary | ICD-10-CM | POA: Insufficient documentation

## 2022-01-31 NOTE — Progress Notes (Signed)
Established Patient Office Visit  Subjective   Patient ID: Ariana Cohen, female    DOB: [birth year]  Age: 61 y.o. MRN: [id number]  Chief Complaint  Patient presents with   Anxiety    Pt states having trouble focusing and being anxious.    HPI Pt is here c/o anxiety --- she is struggling to focus.    Patient Active Problem List   Diagnosis Date Noted   Overactive bladder 01/31/2022   Skin nodule 08/31/2021   Vaginal itching 03/05/2020   History of sleep apnea 04/01/2018   Positive ANA (antinuclear antibody) 03/23/2018   Idiopathic chronic gout of multiple sites without tophus 02/27/2018   Hyperuricemia 01/21/2018   Rash 01/21/2018   Great toe pain, left 10/07/2017   Gout involving toe of left foot 08/25/2017   Transient alteration of awareness 10/01/2016   Memory loss 10/01/2016   Obesity (BMI 30.0-34.9) 08/12/2016   Onychomycosis 08/12/2016   Acute reaction to situational stress 07/01/2016   GAD (generalized anxiety disorder) 06/26/2016   OSA (obstructive sleep apnea) 06/12/2016   Snoring 06/11/2016   Other fatigue 03/26/2016   Medication management 11/06/2015   Excessive daytime sleepiness 11/06/2015   Murmur 08/11/2015   PVCs (premature ventricular contractions) 08/11/2015   Shortness of breath 08/11/2015   Essential hypertension 08/11/2015   Past Medical History:  Diagnosis Date   Allergy    Anxiety    Blood transfusion without reported diagnosis 2000   Depression    Fibroids    Heart murmur    Hypertension    Sleep apnea    has cpap doesn't use all the time   Urticaria    Past Surgical History:  Procedure Laterality Date   BREAST BIOPSY  2019   CESAREAN SECTION  1998; 2000   MYOMECTOMY  1992   Social History   Tobacco Use   Smoking status: Never   Smokeless tobacco: Never  Vaping Use   Vaping Use: Never used  Substance Use Topics   Alcohol use: Yes    Comment: rarely   Drug use: No   Social History   Socioeconomic History   Marital  status: Unknown    Spouse name: Not on file   Number of children: Not on file   Years of education: Not on file   Highest education level: Not on file  Occupational History    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   Occupation: [employer]    Comment: [place]  Tobacco Use   Smoking status: Never   Smokeless tobacco: Never  Vaping Use   Vaping Use: Never used  Substance and Sexual Activity   Alcohol use: Yes    Comment: rarely   Drug use: No   Sexual activity: Yes  Other Topics Concern   Not on file  Social History Narrative   epworth sleepiness scale = 18 (08/11/15)   Social Determinants of Health   Financial Resource Strain: Not on file  Food Insecurity: Not on file  Transportation Needs: Not on file  Physical Activity: Not on file  Stress: Not on file  Social Connections: Not on file  Intimate Partner Violence: Not on file   Family Status  Relation Name Status   Mother  Alive   Father  Deceased   MGM  Deceased   MGF  Deceased   Sister  Alive   Brother  Alive   Son  Alive   Daughter  Alive   Neg Hx  (Not Specified)   Family History  Problem Relation Age of Onset   Hypertension Mother    Renal Disease Mother    Schizophrenia Mother    Hypertension Father    Heart disease Father        also OSA   Hypertension Maternal Grandmother    Heart attack Maternal Grandmother    Thyroid disease Maternal Grandmother        thyroidectomy   Hypertension Maternal Grandfather    Multiple myeloma Maternal Grandfather    Sleep apnea Maternal Grandfather    Hypertension Sister    Healthy Son    Asthma Daughter    Healthy Daughter    Colon cancer Neg Hx    Esophageal cancer Neg Hx    Stomach cancer Neg Hx    Rectal cancer Neg Hx    Allergies  Allergen Reactions   Norvasc [Amlodipine Besylate] Swelling   Aspirin Hives   Colchicine     Diarrhea    Metoprolol Other (See Comments)    Fatigue, somnolence    Minoxidil Other (See Comments)    Weight gain, extreme edema   Legs, feet, hands, lethargic     Other Other (See Comments)      Review of Systems  Constitutional:  Negative for fever and malaise/fatigue.  HENT:  Negative for congestion.   Eyes:  Negative for blurred vision.  Respiratory:  Negative for shortness of breath.   Cardiovascular:  Negative for chest pain, palpitations and leg swelling.  Gastrointestinal:  Negative for abdominal pain, blood in stool and nausea.  Genitourinary:  Negative for dysuria and frequency.  Musculoskeletal:  Negative for falls.  Skin:  Negative for rash.  Neurological:  Negative for dizziness, loss of consciousness and headaches.  Endo/Heme/Allergies:  Negative for environmental allergies.  Psychiatric/Behavioral:  Negative for depression. The patient is nervous/anxious.       Objective:     BP 126/84 (BP Location: Left Arm, Patient Position: Sitting, Cuff Size: Large)   Pulse 60   Temp 97.7 F (36.5 C) (Oral)   Resp 18   Ht 5' 6" (1.676 m)   Wt 207 lb 12.8 oz (94.3 kg)   LMP 06/20/2013   SpO2 98%   BMI 33.54 kg/m  BP Readings from Last 3 Encounters:  01/29/22 126/84  08/31/21 110/70  06/04/21 106/68   Wt Readings from Last 3 Encounters:  01/29/22 207 lb 12.8 oz (94.3 kg)  08/31/21 200 lb (90.7 kg)  06/04/21 202 lb (91.6 kg)   SpO2 Readings from Last 3 Encounters:  01/29/22 98%  08/31/21 97%  06/04/21 96%      Physical Exam Vitals and nursing note reviewed.  Constitutional:      Appearance: She is well-developed.  HENT:     Head: Normocephalic and atraumatic.  Eyes:     Conjunctiva/sclera: Conjunctivae normal.  Neck:     Thyroid: No thyromegaly.     Vascular: No carotid bruit or JVD.  Cardiovascular:     Rate and Rhythm: Normal rate and regular rhythm.     Heart sounds: Normal heart sounds. No murmur heard. Pulmonary:     Effort: Pulmonary effort is normal. No respiratory distress.     Breath sounds: Normal breath sounds. No wheezing or rales.  Chest:     Chest wall: No  tenderness.  Musculoskeletal:     Cervical back: Normal range of motion and neck supple.  Neurological:     Mental Status: She is alert and oriented to person, place, and time.  Psychiatric:  Mood and Affect: Mood is anxious. Mood is not depressed. Affect is not labile, flat or tearful.        Behavior: Behavior normal.        Thought Content: Thought content normal. Thought content does not include homicidal or suicidal ideation. Thought content does not include homicidal or suicidal plan.      No results found for any visits on 01/29/22.  Last CBC Lab Results  Component Value Date   WBC 8.3 08/31/2021   HGB 14.7 08/31/2021   HCT 45.1 (H) 08/31/2021   MCV 84.0 08/31/2021   MCH 27.4 08/31/2021   RDW 13.2 08/31/2021   PLT 326 85/27/7824   Last metabolic panel Lab Results  Component Value Date   GLUCOSE 90 08/31/2021   NA 139 08/31/2021   K 4.0 08/31/2021   CL 103 08/31/2021   CO2 25 08/31/2021   BUN 12 08/31/2021   CREATININE 0.81 08/31/2021   GFRNONAA >60 02/03/2021   CALCIUM 10.1 08/31/2021   PROT 7.0 08/31/2021   ALBUMIN 4.0 10/31/2016   BILITOT 0.6 08/31/2021   ALKPHOS 112 10/31/2016   AST 26 08/31/2021   ALT 28 08/31/2021   ANIONGAP 8 02/03/2021   Last lipids Lab Results  Component Value Date   CHOL 145 08/31/2021   HDL 50 08/31/2021   LDLCALC 81 08/31/2021   TRIG 62 08/31/2021   CHOLHDL 2.9 08/31/2021   Last hemoglobin A1c Lab Results  Component Value Date   HGBA1C 6.1 (H) 09/20/2017   Last thyroid functions Lab Results  Component Value Date   TSH 1.22 08/31/2021   Last vitamin D No results found for: "25OHVITD2", "25OHVITD3", "VD25OH" Last vitamin B12 and Folate Lab Results  Component Value Date   MPNTIRWE31 540 10/02/2016      The 10-year ASCVD risk score (Arnett DK, et al., 2019) is: 5.4%    Assessment & Plan:   Problem List Items Addressed This Visit       Unprioritized   Overactive bladder    Try vesicare Refer to  uro gyn      Relevant Medications   solifenacin (VESICARE) 5 MG tablet   Other Relevant Orders   Ambulatory referral to Urogynecology   GAD (generalized anxiety disorder)    Start lexapro 10 mg daily  F/u 1 month or sooner prn       Relevant Medications   escitalopram (LEXAPRO) 10 MG tablet   Other Visit Diagnoses     Anxiety    -  Primary   Relevant Medications   escitalopram (LEXAPRO) 10 MG tablet   Other Relevant Orders   CBC with Differential/Platelet   Comprehensive metabolic panel   Lipid panel   Vitamin B12   VITAMIN D 25 Hydroxy (Vit-D Deficiency, Fractures)   Thyroid Panel With TSH   Primary hypertension       Relevant Orders   CBC with Differential/Platelet   Comprehensive metabolic panel   Lipid panel   Vitamin B12   VITAMIN D 25 Hydroxy (Vit-D Deficiency, Fractures)   Thyroid Panel With TSH   Hyperlipidemia, unspecified hyperlipidemia type       Relevant Orders   CBC with Differential/Platelet   Comprehensive metabolic panel   Lipid panel   Vitamin B12   VITAMIN D 25 Hydroxy (Vit-D Deficiency, Fractures)   Thyroid Panel With TSH   Need for influenza vaccination       Relevant Orders   Flu Vaccine QUAD 72moIM (Fluarix, Fluzone & Alfiuria Quad PF) (Completed)  Return in about 4 weeks (around 02/26/2022), or if symptoms worsen or fail to improve, for anxiety.    Ann Held, DO

## 2022-01-31 NOTE — Assessment & Plan Note (Signed)
Try vesicare Refer to uro gyn

## 2022-01-31 NOTE — Assessment & Plan Note (Signed)
Start lexapro 10 mg daily  F/u 1 month or sooner prn

## 2022-02-12 ENCOUNTER — Telehealth: Payer: Self-pay | Admitting: Interventional Cardiology

## 2022-02-12 MED ORDER — SPIRONOLACTONE 50 MG PO TABS: 50.0000 mg | ORAL_TABLET | Freq: Every day | ORAL | 1 refills | Status: DC

## 2022-02-12 MED ORDER — TELMISARTAN-HCTZ 80-12.5 MG PO TABS: 1.0000 | ORAL_TABLET | Freq: Every day | ORAL | 1 refills | Status: DC

## 2022-02-12 NOTE — Telephone Encounter (Signed)
Pt's medication was sent to pt's pharmacy as requested. Confirmation received.  °

## 2022-02-12 NOTE — Telephone Encounter (Signed)
*  STAT* If patient is at the pharmacy, call can be transferred to refill team.   1. Which medications need to be refilled? (please list name of each medication and dose if known)   spironolactone (ALDACTONE) 50 MG tablet telmisartan-hydrochlorothiazide (MICARDIS HCT) 80-12.5 MG tablet  2. Which pharmacy/location (including street and city if local pharmacy) is medication to be sent to?  [id number] [place], [place] [facility] [address]  3. Do they need a 30 day or 90 day supply? 90 day  Patient stated she is almost out of these medications.  Patient has appointment scheduled on 04/03/22.

## 2022-02-21 ENCOUNTER — Other Ambulatory Visit: Payer: Self-pay | Admitting: Family Medicine

## 2022-02-21 DIAGNOSIS — F419 Anxiety disorder, unspecified: Secondary | ICD-10-CM

## 2022-02-21 DIAGNOSIS — N3281 Overactive bladder: Secondary | ICD-10-CM

## 2022-02-26 ENCOUNTER — Telehealth: Payer: Self-pay | Admitting: Family Medicine

## 2022-02-26 ENCOUNTER — Encounter: Payer: Self-pay | Admitting: Family Medicine

## 2022-02-26 ENCOUNTER — Telehealth (INDEPENDENT_AMBULATORY_CARE_PROVIDER_SITE_OTHER): Payer: Managed Care, Other (non HMO) | Admitting: Family Medicine

## 2022-02-26 DIAGNOSIS — F419 Anxiety disorder, unspecified: Secondary | ICD-10-CM

## 2022-02-26 DIAGNOSIS — F411 Generalized anxiety disorder: Secondary | ICD-10-CM | POA: Diagnosis not present

## 2022-02-26 MED ORDER — ESCITALOPRAM OXALATE 20 MG PO TABS: 20.0000 mg | ORAL_TABLET | Freq: Every day | ORAL | 1 refills | Status: DC

## 2022-02-26 NOTE — Telephone Encounter (Signed)
Called & lvm to schedule a f/u

## 2022-02-26 NOTE — Assessment & Plan Note (Signed)
Inc lexapro to 20 mg and f/u 1 month or sooner prn

## 2022-02-26 NOTE — Progress Notes (Addendum)
MyChart Video Visit    Virtual Visit via Video Note   This visit type was conducted due to national recommendations for restrictions regarding the COVID-19 Pandemic (e.g. social distancing) in an effort to limit this patient's exposure and mitigate transmission in our community. This patient is at least at moderate risk for complications without adequate follow up. This format is felt to be most appropriate for this patient at this time. Physical exam was limited by quality of the video and audio technology used for the visit. CMA was able to get the patient set up on a video visit.  Patient location: Home Patient and provider in visit Provider location: Office  I discussed the limitations of evaluation and management by telemedicine and the availability of in person appointments. The patient expressed understanding and agreed to proceed.  Visit Date: 02/26/2022.  Today's healthcare provider: Ann Held, DO     Subjective:    Patient ID: Ariana Cohen, female    DOB: 26-Nov-1960, 61 y.o.   MRN: [id number]  Chief Complaint  Patient presents with   Anxiety   Follow-up    Anxiety Symptoms include nervous/anxious behavior. Patient reports no chest pain, dizziness, nausea, palpitations or shortness of breath.     Patient is in today for a virtual office visit.  Anxiety Patient is complaint with her 10 mg Lexapro, but states that the medication worked the first day, but does not now. She reports that she still feels overwhelmed and has tension in her shoulders and can't concentrate. She denies any difficulty sleeping. Patient is uninterested in receiving therapy.  Past Medical History:  Diagnosis Date   Allergy    Anxiety    Blood transfusion without reported diagnosis 2000   Depression    Fibroids    Heart murmur    Hypertension    Sleep apnea    has cpap doesn't use all the time   Urticaria     Past Surgical History:  Procedure Laterality Date    BREAST BIOPSY  2019   CESAREAN SECTION  1998; 2000   MYOMECTOMY  1992    Family History  Problem Relation Age of Onset   Hypertension Mother    Renal Disease Mother    Schizophrenia Mother    Hypertension Father    Heart disease Father        also OSA   Hypertension Maternal Grandmother    Heart attack Maternal Grandmother    Thyroid disease Maternal Grandmother        thyroidectomy   Hypertension Maternal Grandfather    Multiple myeloma Maternal Grandfather    Sleep apnea Maternal Grandfather    Hypertension Sister    Healthy Son    Asthma Daughter    Healthy Daughter    Colon cancer Neg Hx    Esophageal cancer Neg Hx    Stomach cancer Neg Hx    Rectal cancer Neg Hx     Social History   Socioeconomic History   Marital status: Unknown    Spouse name: Not on file   Number of children: Not on file   Years of education: Not on file   Highest education level: Not on file  Occupational History    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   Occupation: [employer]    Comment: [place]  Tobacco Use   Smoking status: Never   Smokeless tobacco: Never  Vaping Use   Vaping Use: Never used  Substance and Sexual Activity   Alcohol  use: Yes    Comment: rarely   Drug use: No   Sexual activity: Yes  Other Topics Concern   Not on file  Social History Narrative   epworth sleepiness scale = 18 (08/11/15)   Social Determinants of Health   Financial Resource Strain: Not on file  Food Insecurity: Not on file  Transportation Needs: Not on file  Physical Activity: Not on file  Stress: Not on file  Social Connections: Not on file  Intimate Partner Violence: Not on file    Outpatient Medications Prior to Visit  Medication Sig Dispense Refill   fexofenadine (ALLEGRA) 60 MG tablet Take by mouth.     Multiple Vitamin (MULTIVITAMIN) capsule Take 1 capsule by mouth daily.     rosuvastatin (CRESTOR) 10 MG tablet Take 1 tablet (10 mg total) by mouth daily. 90 tablet 3   solifenacin  (VESICARE) 5 MG tablet TAKE 1 TABLET (5 MG TOTAL) BY MOUTH DAILY. 90 tablet 1   spironolactone (ALDACTONE) 50 MG tablet Take 1 tablet (50 mg total) by mouth daily. 30 tablet 1   telmisartan-hydrochlorothiazide (MICARDIS HCT) 80-12.5 MG tablet Take 1 tablet by mouth daily. 30 tablet 1   escitalopram (LEXAPRO) 10 MG tablet TAKE 1 TABLET BY MOUTH EVERY DAY 90 tablet 1   No facility-administered medications prior to visit.    Allergies  Allergen Reactions   Norvasc [Amlodipine Besylate] Swelling   Aspirin Hives   Colchicine     Diarrhea    Metoprolol Other (See Comments)    Fatigue, somnolence    Minoxidil Other (See Comments)    Weight gain, extreme edema  Legs, feet, hands, lethargic     Other Other (See Comments)    Review of Systems  Constitutional:  Negative for fever and malaise/fatigue.  HENT:  Negative for congestion.   Eyes:  Negative for blurred vision.  Respiratory:  Negative for shortness of breath.   Cardiovascular:  Negative for chest pain, palpitations and leg swelling.  Gastrointestinal:  Negative for abdominal pain, blood in stool and nausea.  Genitourinary:  Negative for dysuria and frequency.  Musculoskeletal:  Negative for falls.  Skin:  Negative for rash.  Neurological:  Negative for dizziness, loss of consciousness and headaches.  Endo/Heme/Allergies:  Negative for environmental allergies.  Psychiatric/Behavioral:  Negative for depression. The patient is nervous/anxious.        Objective:    Physical Exam Vitals and nursing note reviewed.  Psychiatric:        Mood and Affect: Mood is anxious and depressed. Affect is not labile, blunt, flat, angry, tearful or inappropriate.        Thought Content: Thought content does not include homicidal or suicidal ideation. Thought content does not include homicidal or suicidal plan.    LMP 06/20/2013  Wt Readings from Last 3 Encounters:  01/29/22 207 lb 12.8 oz (94.3 kg)  08/31/21 200 lb (90.7 kg)  06/04/21  202 lb (91.6 kg)    Diabetic Foot Exam - Simple   No data filed    Lab Results  Component Value Date   WBC 8.3 08/31/2021   HGB 14.7 08/31/2021   HCT 45.1 (H) 08/31/2021   PLT 326 08/31/2021   GLUCOSE 90 08/31/2021   CHOL 145 08/31/2021   TRIG 62 08/31/2021   HDL 50 08/31/2021   LDLCALC 81 08/31/2021   ALT 28 08/31/2021   AST 26 08/31/2021   NA 139 08/31/2021   K 4.0 08/31/2021   CL 103 08/31/2021   CREATININE  0.81 08/31/2021   BUN 12 08/31/2021   CO2 25 08/31/2021   TSH 1.22 08/31/2021   HGBA1C 6.1 (H) 09/20/2017    Lab Results  Component Value Date   TSH 1.22 08/31/2021   Lab Results  Component Value Date   WBC 8.3 08/31/2021   HGB 14.7 08/31/2021   HCT 45.1 (H) 08/31/2021   MCV 84.0 08/31/2021   PLT 326 08/31/2021   Lab Results  Component Value Date   NA 139 08/31/2021   K 4.0 08/31/2021   CO2 25 08/31/2021   GLUCOSE 90 08/31/2021   BUN 12 08/31/2021   CREATININE 0.81 08/31/2021   BILITOT 0.6 08/31/2021   ALKPHOS 112 10/31/2016   AST 26 08/31/2021   ALT 28 08/31/2021   PROT 7.0 08/31/2021   ALBUMIN 4.0 10/31/2016   CALCIUM 10.1 08/31/2021   ANIONGAP 8 02/03/2021   Lab Results  Component Value Date   CHOL 145 08/31/2021   Lab Results  Component Value Date   HDL 50 08/31/2021   Lab Results  Component Value Date   LDLCALC 81 08/31/2021   Lab Results  Component Value Date   TRIG 62 08/31/2021   Lab Results  Component Value Date   CHOLHDL 2.9 08/31/2021   Lab Results  Component Value Date   HGBA1C 6.1 (H) 09/20/2017       Assessment & Plan:   Problem List Items Addressed This Visit       Unprioritized   GAD (generalized anxiety disorder)    Inc lexapro to 20 mg and f/u 1 month or sooner prn       Relevant Medications   escitalopram (LEXAPRO) 20 MG tablet   Other Visit Diagnoses     Anxiety    -  Primary   Relevant Medications   escitalopram (LEXAPRO) 20 MG tablet   Other Relevant Orders   Ambulatory referral to  Psychology      Meds ordered this encounter  Medications   escitalopram (LEXAPRO) 20 MG tablet    Sig: Take 1 tablet (20 mg total) by mouth daily.    Dispense:  90 tablet    Refill:  1    I discussed the assessment and treatment plan with the patient. The patient was provided an opportunity to ask questions and all were answered. The patient agreed with the plan and demonstrated an understanding of the instructions.   The patient was advised to call back or seek an in-person evaluation if the symptoms worsen or if the condition fails to improve as anticipated.  I provided 20 minutes of face-to-face time during this encounter.   Ann Held, DO [place] [facility] [phone] (phone) [phone] (fax)  [place]

## 2022-02-26 NOTE — Telephone Encounter (Signed)
-----   Message from Ann Held, DO sent at 02/26/2022  2:49 PM EDT ----- Needs f/u app in 1 month-- virtual ok

## 2022-03-24 NOTE — Progress Notes (Unsigned)
Office Visit    Patient Name: Ariana Cohen Date of Encounter: 03/25/2022  PCP:  Lowne Chase, [facility]  Cardiologist:  Sinclair Grooms, MD  Advanced Practice Provider:  No care team member to display Electrophysiologist:  None   HPI    Ariana Cohen is a 61 y.o. female with past medical history of obesity hypertension hyperlipoidemia, leg edema, sleep apnea, depression, heart murmur, anxiety presents today for annual follow-up appointment.  She was last seen on 03/2021 and at that time she had a recent [place] ER visit for chest pain.  She was seen by Dr. Debara Pickett as [facility] and subsequently by [facility] was diagnosed with mild MR, hypertension, OSA/hypersomnia, and obesity.  When she was seen in the emergency room, she had a vague complaint of shortness of breath and chest discomfort.  Chest discomfort was fleeting and sharp.  Shortness of breath associated when sitting but not necessarily associated with physical activity. She was measuring her blood pressures and noted it was significantly variable.  Today, she has been doing okay without chest pains or SOB. In, 2019 she was treated at [facility] for her OSA and is having trouble with her CPAP compliance. She endorses issues with memory and I suggested speaking with her primary during her next appointment. She tells me memory issues run in her family as well as CHF. Most recent echo reviewed with the patient. Echo from last year reviewed with the patient. BP has been well controlled.  Reports no shortness of breath nor dyspnea on exertion. Reports no chest pain, pressure, or tightness. No edema, orthopnea, PND. Reports no palpitations.   Past Medical History    Past Medical History:  Diagnosis Date   Allergy    Anxiety    Blood transfusion without reported diagnosis 2000   Depression    Fibroids    Heart murmur    Hypertension    Sleep apnea    has cpap doesn't use all  the time   Urticaria    Past Surgical History:  Procedure Laterality Date   BREAST BIOPSY  2019   CESAREAN SECTION  1998; 2000   MYOMECTOMY  1992    Allergies  Allergies  Allergen Reactions   Norvasc [Amlodipine Besylate] Swelling   Aspirin Hives   Colchicine     Diarrhea    Metoprolol Other (See Comments)    Fatigue, somnolence    Minoxidil Other (See Comments)    Weight gain, extreme edema  Legs, feet, hands, lethargic     Other Other (See Comments)    EKGs/Labs/Other Studies Reviewed:   The following studies were reviewed today:  04/19/21 Echocardiogram  IMPRESSIONS     1. Left ventricular ejection fraction, by estimation, is 55 to 60%. The  left ventricle has normal function. The left ventricle has no regional  wall motion abnormalities. Left ventricular diastolic parameters were  normal.   2. Right ventricular systolic function is normal. The right ventricular  size is normal.   3. The mitral valve is normal in structure. Trivial mitral valve  regurgitation. No evidence of mitral stenosis.   4. The aortic valve is tricuspid. Aortic valve regurgitation is not  visualized. Aortic valve sclerosis is present, with no evidence of aortic  valve stenosis.   5. The inferior vena cava is normal in size with greater than 50%  respiratory variability, suggesting right atrial pressure of 3 mmHg.  Comparison(s): Prior images unable to be directly viewed, comparison made  by report only.    EKG:  EKG is  ordered today.  The ekg ordered today demonstrates NSR, rate 62 bpm  Recent Labs: 08/31/2021: ALT 28; BUN 12; Creat 0.81; Hemoglobin 14.7; Platelets 326; Potassium 4.0; Sodium 139; TSH 1.22  Recent Lipid Panel    Component Value Date/Time   CHOL 145 08/31/2021 1550   TRIG 62 08/31/2021 1550   HDL 50 08/31/2021 1550   CHOLHDL 2.9 08/31/2021 1550   LDLCALC 81 08/31/2021 1550    Home Medications   Current Meds  Medication Sig   escitalopram (LEXAPRO) 20 MG  tablet Take 1 tablet (20 mg total) by mouth daily. (Patient taking differently: Take 20 mg by mouth as needed.)   fexofenadine (ALLEGRA) 60 MG tablet Take by mouth.   rosuvastatin (CRESTOR) 10 MG tablet Take 1 tablet (10 mg total) by mouth daily. (Patient taking differently: Take 10 mg by mouth as needed.)   solifenacin (VESICARE) 5 MG tablet TAKE 1 TABLET (5 MG TOTAL) BY MOUTH DAILY. (Patient taking differently: Take 5 mg by mouth as needed.)   [DISCONTINUED] Multiple Vitamin (MULTIVITAMIN) capsule Take 1 capsule by mouth daily.   [DISCONTINUED] spironolactone (ALDACTONE) 50 MG tablet Take 1 tablet (50 mg total) by mouth daily.   [DISCONTINUED] telmisartan-hydrochlorothiazide (MICARDIS HCT) 80-12.5 MG tablet Take 1 tablet by mouth daily.     Review of Systems      All other systems reviewed and are otherwise negative except as noted above.  Physical Exam    VS:  BP 120/80   Pulse 62   Ht '5\' 6"'$  (1.676 m)   Wt 205 lb 9.6 oz (93.3 kg)   LMP 06/20/2013   SpO2 96%   BMI 33.18 kg/m  , BMI Body mass index is 33.18 kg/m.  Wt Readings from Last 3 Encounters:  03/25/22 205 lb 9.6 oz (93.3 kg)  01/29/22 207 lb 12.8 oz (94.3 kg)  08/31/21 200 lb (90.7 kg)     GEN: Well nourished, well developed, in no acute distress. HEENT: normal. Neck: Supple, no JVD, carotid bruits, or masses. Cardiac: RRR with occasional skipper beat, no murmurs, rubs, or gallops. No clubbing, cyanosis, edema.  Radials/PT 2+ and equal bilaterally.  Respiratory:  Respirations regular and unlabored, clear to auscultation bilaterally. GI: Soft, nontender, nondistended. MS: No deformity or atrophy. Skin: Warm and dry, no rash. Neuro:  Strength and sensation are intact. Psych: Normal affect.  Assessment & Plan     Left ventricular hypertrophy -continue tight BP control -asked her to monitor her BP in the morning before medications and before bed -if greater than 140/90 please call our office -continue current  medications with Micardis 80-12.'5mg'$  daily -no indicated to repeat echo  Obstructive sleep apnea on CPAP  -refer to Dr. Radford Pax to explore mask options since compliance wavers  Essential hypertension -continue current medication and continue to monitor at home     Disposition: Follow up 3 months to establish care with Sinclair Grooms, MD or APP.  Signed, Elgie Collard, PA-C 03/25/2022, 2:23 PM [facility]

## 2022-03-25 ENCOUNTER — Encounter: Payer: Self-pay | Admitting: Physician Assistant

## 2022-03-25 ENCOUNTER — Ambulatory Visit: Payer: Managed Care, Other (non HMO) | Attending: Physician Assistant | Admitting: Physician Assistant

## 2022-03-25 VITALS — BP 120/80 | HR 62 | Ht 66.0 in | Wt 205.6 lb

## 2022-03-25 DIAGNOSIS — I517 Cardiomegaly: Secondary | ICD-10-CM | POA: Diagnosis not present

## 2022-03-25 DIAGNOSIS — R079 Chest pain, unspecified: Secondary | ICD-10-CM

## 2022-03-25 DIAGNOSIS — R0602 Shortness of breath: Secondary | ICD-10-CM

## 2022-03-25 DIAGNOSIS — G4733 Obstructive sleep apnea (adult) (pediatric): Secondary | ICD-10-CM | POA: Diagnosis not present

## 2022-03-25 DIAGNOSIS — I10 Essential (primary) hypertension: Secondary | ICD-10-CM

## 2022-03-25 DIAGNOSIS — R011 Cardiac murmur, unspecified: Secondary | ICD-10-CM

## 2022-03-25 MED ORDER — SPIRONOLACTONE 50 MG PO TABS: 50.0000 mg | ORAL_TABLET | Freq: Every day | ORAL | 3 refills | Status: DC

## 2022-03-25 MED ORDER — TELMISARTAN-HCTZ 80-12.5 MG PO TABS: 1.0000 | ORAL_TABLET | Freq: Every day | ORAL | 3 refills | Status: DC

## 2022-03-25 NOTE — Patient Instructions (Signed)
Medication Instructions:  Your physician recommends that you continue on your current medications as directed. Please refer to the Current Medication list given to you today.  *If you need a refill on your cardiac medications before your next appointment, please call your pharmacy*   Lab Work: None If you have labs (blood work) drawn today and your tests are completely normal, you will receive your results only by: [place] (if you have MyChart) OR A paper copy in the mail If you have any lab test that is abnormal or we need to change your treatment, we will call you to review the results.   Follow-Up: At [facility], you and your health needs are our priority.  As part of our continuing mission to provide you with exceptional heart care, we have created designated Provider Care Teams.  These Care Teams include your primary Cardiologist (physician) and Advanced Practice Providers (APPs -  Physician Assistants and Nurse Practitioners) who all work together to provide you with the care you need, when you need it.  Your next appointment:   3 month(s)  The format for your next appointment:   In Person  Provider:   Dr Johney Frame  Other Instructions You have been referred to see Dr Radford Pax here in our office to discuss obstructive sleep apnea.  Important Information About Sugar

## 2022-04-03 ENCOUNTER — Ambulatory Visit: Payer: Managed Care, Other (non HMO) | Admitting: Physician Assistant

## 2022-04-25 ENCOUNTER — Ambulatory Visit: Payer: Managed Care, Other (non HMO) | Admitting: Cardiology

## 2022-06-05 ENCOUNTER — Ambulatory Visit: Payer: Managed Care, Other (non HMO) | Attending: Cardiology | Admitting: Cardiology

## 2022-06-05 NOTE — Progress Notes (Deleted)
Sleep Medicine CONSULT Note    Date:  06/05/2022   ID:  Ariana Cohen, DOB 14-Jun-1960, MRN [id number]  PCP:  Ann Held, DO  Cardiologist: Sinclair Grooms, MD (Inactive)   No chief complaint on file.   History of Present Illness:  Ariana Cohen is a 62 y.o. female who is being seen today for the evaluation of OSA at the request of Daneen Schick, MD.  This is a 62yo femal with a hx of LVH, HTN who was seen by Daneen Schick, MD in the past.  She also has a hx of OSA initially dx at [facility] in 2019 but has had problems with PAP compliance.  She is now referred to Sleep medicine for consultation .  Past Medical History:  Diagnosis Date   Allergy    Anxiety    Blood transfusion without reported diagnosis 2000   Depression    Fibroids    Heart murmur    Hypertension    Sleep apnea    has cpap doesn't use all the time   Urticaria     Past Surgical History:  Procedure Laterality Date   BREAST BIOPSY  2019   [place]; 2000   MYOMECTOMY  1992    Current Medications: No outpatient medications have been marked as taking for the 06/05/22 encounter (Appointment) with Sueanne Margarita, MD.    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   Occupation: [employer]    Comment: [place]  Tobacco Use   Smoking status: Never   Smokeless tobacco: Never  Vaping Use   Vaping Use: Never used  Substance and Sexual Activity   Alcohol use: Yes    Comment: rarely   Drug use: No   Sexual activity: Yes  Other Topics Concern   Not on file  Social History Narrative   epworth sleepiness scale = 18 (08/11/15)   Social Determinants of Health    Financial Resource Strain: Not on file  Food Insecurity: Not on file  Transportation Needs: Not on file  Physical Activity: Not on file  Stress: Not on file  Social Connections: Not on file     Family History:  The patient's family history includes Asthma in her daughter; Healthy in her daughter and son; Heart attack in her maternal grandmother; Heart disease in her father; Hypertension in her father, maternal grandfather, maternal grandmother, mother, and sister; Multiple myeloma in her maternal grandfather; Renal Disease in her mother; Schizophrenia in her mother; Sleep apnea in her maternal grandfather; Thyroid disease in her maternal grandmother.   ROS:   Please see the history of present illness.    ROS All other systems reviewed and are negative.      No data to display             PHYSICAL EXAM:   VS:  LMP 06/20/2013    GEN: Well nourished, well developed, in no acute distress  HEENT: normal  Neck: no JVD, carotid bruits, or masses Cardiac: RRR; no murmurs, rubs, or gallops,no edema.  Intact distal pulses bilaterally.  Respiratory:  clear to auscultation  bilaterally, normal work of breathing GI: soft, nontender, nondistended, + BS MS: no deformity or atrophy  Skin: warm and dry, no rash Neuro:  Alert and Oriented x 3, Strength and sensation are intact Psych: euthymic mood, full affect  Wt Readings from Last 3 Encounters:  03/25/22 205 lb 9.6 oz (93.3 kg)  01/29/22 207 lb 12.8 oz (94.3 kg)  08/31/21 200 lb (90.7 kg)      Studies/Labs Reviewed:   none  Recent Labs: 08/31/2021: ALT 28; BUN 12; Creat 0.81; Hemoglobin 14.7; Platelets 326; Potassium 4.0; Sodium 139; TSH 1.22   Additional studies/ records that were reviewed today include:  OV notes by Nicholes Rough, NP    ASSESSMENT:    1. Obstructive sleep apnea   2. Essential hypertension      PLAN:  In order of problems listed above:  OSA - The patient is tolerating PAP therapy well without any  problems. The PAP download performed by his DME was personally reviewed and interpreted by me today and showed an AHI of ***/hr on *** cm H2O with ***% compliance in using more than 4 hours nightly.  The patient has been using and benefiting from PAP use and will continue to benefit from therapy.   2.  HTN -BP controlled -continue prescription drug management with spiro '50mg'$  daily and telmisartan HCT 80-12.'5mg'$  daily with PRN refills   Time Spent: 20 minutes total time of encounter, including 15 minutes spent in face-to-face patient care on the date of this encounter. This time includes coordination of care and counseling regarding above mentioned problem list. Remainder of non-face-to-face time involved reviewing chart documents/testing relevant to the patient encounter and documentation in the medical record. I have independently reviewed documentation from referring provider  Medication Adjustments/Labs and Tests Ordered: Current medicines are reviewed at length with the patient today.  Concerns regarding medicines are outlined above.  Medication changes, Labs and Tests ordered today are listed in the Patient Instructions below.  There are no Patient Instructions on file for this visit.   Signed, Fransico Him, MD  06/05/2022 12:45 PM    [address], [place], [place]  [address] Phone: [phone]; Fax: [phone]

## 2022-07-01 ENCOUNTER — Other Ambulatory Visit: Payer: Self-pay

## 2022-07-01 MED ORDER — ROSUVASTATIN CALCIUM 10 MG PO TABS: 10.0000 mg | ORAL_TABLET | Freq: Every day | ORAL | 2 refills | Status: DC

## 2022-08-06 ENCOUNTER — Ambulatory Visit (INDEPENDENT_AMBULATORY_CARE_PROVIDER_SITE_OTHER): Payer: Managed Care, Other (non HMO) | Admitting: Family Medicine

## 2022-08-06 ENCOUNTER — Encounter: Payer: Self-pay | Admitting: Family Medicine

## 2022-08-06 VITALS — BP 118/72 | HR 71 | Temp 97.7°F | Resp 18 | Ht 66.0 in | Wt 209.8 lb

## 2022-08-06 DIAGNOSIS — E785 Hyperlipidemia, unspecified: Secondary | ICD-10-CM

## 2022-08-06 DIAGNOSIS — Z23 Encounter for immunization: Secondary | ICD-10-CM

## 2022-08-06 DIAGNOSIS — Z0000 Encounter for general adult medical examination without abnormal findings: Secondary | ICD-10-CM

## 2022-08-06 DIAGNOSIS — I10 Essential (primary) hypertension: Secondary | ICD-10-CM

## 2022-08-06 DIAGNOSIS — G4730 Sleep apnea, unspecified: Secondary | ICD-10-CM

## 2022-08-06 DIAGNOSIS — M65311 Trigger thumb, right thumb: Secondary | ICD-10-CM

## 2022-08-06 MED ORDER — PREDNISONE 10 MG PO TABS
ORAL_TABLET | ORAL | 0 refills | Status: DC
Start: 2022-08-06 — End: 2022-11-22

## 2022-08-06 NOTE — Patient Instructions (Signed)
Preventive Care 40-62 Years Old, Female Preventive care refers to lifestyle choices and visits with your health care provider that can promote health and wellness. Preventive care visits are also called wellness exams. What can I expect for my preventive care visit? Counseling Your health care provider may ask you questions about your: Medical history, including: Past medical problems. Family medical history. Pregnancy history. Current health, including: Menstrual cycle. Method of birth control. Emotional well-being. Home life and relationship well-being. Sexual activity and sexual health. Lifestyle, including: Alcohol, nicotine or tobacco, and drug use. Access to firearms. Diet, exercise, and sleep habits. Work and work environment. Sunscreen use. Safety issues such as seatbelt and bike helmet use. Physical exam Your health care provider will check your: Height and weight. These may be used to calculate your BMI (body mass index). BMI is a measurement that tells if you are at a healthy weight. Waist circumference. This measures the distance around your waistline. This measurement also tells if you are at a healthy weight and may help predict your risk of certain diseases, such as type 2 diabetes and high blood pressure. Heart rate and blood pressure. Body temperature. Skin for abnormal spots. What immunizations do I need?  Vaccines are usually given at various ages, according to a schedule. Your health care provider will recommend vaccines for you based on your age, medical history, and lifestyle or other factors, such as travel or where you work. What tests do I need? Screening Your health care provider may recommend screening tests for certain conditions. This may include: Lipid and cholesterol levels. Diabetes screening. This is done by checking your blood sugar (glucose) after you have not eaten for a while (fasting). Pelvic exam and Pap test. Hepatitis B test. Hepatitis C  test. HIV (human immunodeficiency virus) test. STI (sexually transmitted infection) testing, if you are at risk. Lung cancer screening. Colorectal cancer screening. Mammogram. Talk with your health care provider about when you should start having regular mammograms. This may depend on whether you have a family history of breast cancer. BRCA-related cancer screening. This may be done if you have a family history of breast, ovarian, tubal, or peritoneal cancers. Bone density scan. This is done to screen for osteoporosis. Talk with your health care provider about your test results, treatment options, and if necessary, the need for more tests. Follow these instructions at home: Eating and drinking  Eat a diet that includes fresh fruits and vegetables, whole grains, lean protein, and low-fat dairy products. Take vitamin and mineral supplements as recommended by your health care provider. Do not drink alcohol if: Your health care provider tells you not to drink. You are pregnant, may be pregnant, or are planning to become pregnant. If you drink alcohol: Limit how much you have to 0-1 drink a day. Know how much alcohol is in your drink. In the [place] one drink equals one 12 oz bottle of beer (355 mL), one 5 oz glass of wine (148 mL), or one 1 oz glass of hard liquor (44 mL). Lifestyle Brush your teeth every morning and night with fluoride toothpaste. Floss one time each day. Exercise for at least 30 minutes 5 or more days each week. Do not use any products that contain nicotine or tobacco. These products include cigarettes, chewing tobacco, and vaping devices, such as e-cigarettes. If you need help quitting, ask your health care provider. Do not use drugs. If you are sexually active, practice safe sex. Use a condom or other form of protection to   prevent STIs. If you do not wish to become pregnant, use a form of birth control. If you plan to become pregnant, see your health care provider for a  prepregnancy visit. Take aspirin only as told by your health care provider. Make sure that you understand how much to take and what form to take. Work with your health care provider to find out whether it is safe and beneficial for you to take aspirin daily. Find healthy ways to manage stress, such as: Meditation, yoga, or listening to music. Journaling. Talking to a trusted person. Spending time with friends and family. Minimize exposure to UV radiation to reduce your risk of skin cancer. Safety Always wear your seat belt while driving or riding in a vehicle. Do not drive: If you have been drinking alcohol. Do not ride with someone who has been drinking. When you are tired or distracted. While texting. If you have been using any mind-altering substances or drugs. Wear a helmet and other protective equipment during sports activities. If you have firearms in your house, make sure you follow all gun safety procedures. Seek help if you have been physically or sexually abused. What's next? Visit your health care provider once a year for an annual wellness visit. Ask your health care provider how often you should have your eyes and teeth checked. Stay up to date on all vaccines. This information is not intended to replace advice given to you by your health care provider. Make sure you discuss any questions you have with your health care provider. Document Revised: 10/18/2020 Document Reviewed: 10/18/2020 Elsevier Patient Education  2023 [employer]  

## 2022-08-06 NOTE — Progress Notes (Signed)
Subjective:   By signing my name below, I, Shehryar Baig, attest that this documentation has been prepared under the direction and in the presence of Ann Held, DO. 08/06/2022   Patient ID: Ariana Cohen, female    DOB: 29-Aug-1960, 62 y.o.   MRN: [id number]  No chief complaint on file.   HPI Patient is in today for comprehensive physical exam.   She has pain in her thumb/trigger finger. She is unable to wear a finger splint/ sling due to having to wearing gloves for her job.  She admits to having bad memory and its worse when she is tired. She has sleep apnea but is planning to reschedule an follow up appointment for it.  She denies fever, new moles, congestion, sinus pain, sore throat, chest pain, palpitations, cough, shortness of breath, wheezing, nausea, vomiting, abdominal pain, diarrhea, constipation, dysuria, frequency, hematuria, new muscle pain, new joint pain, or headaches at this time.   She will receive her tetanus shot today. She received her first shingles shot  last year but  haven't received the second.  Mammogram last completed 09/19/2016. Due for a updated one.   Pap Smear last completed on  06/04/2021. Repeat in 3 yrs.  Colonoscopy was last completed on 08/04/2020. - Nine 4 to 11 mm polyps in the rectum and in the transverse colon, removed with a cold snare. Resected and retrieved. - Non- bleeding external and internal hemorrhoids. Repeat in 85yrs.   Past Medical History:  Diagnosis Date   Allergy    Anxiety    Blood transfusion without reported diagnosis 2000   Depression    Fibroids    Heart murmur    Hypertension    Sleep apnea    has cpap doesn't use all the time   Urticaria     Past Surgical History:  Procedure Laterality Date   BREAST BIOPSY  2019   CESAREAN SECTION  1998; 2000   MYOMECTOMY  1992    Family History  Problem Relation Age of Onset   Hypertension Mother    Renal Disease Mother    Schizophrenia Mother    Hypertension  Father    Heart disease Father        also OSA   Hypertension Maternal Grandmother    Heart attack Maternal Grandmother    Thyroid disease Maternal Grandmother        thyroidectomy   Hypertension Maternal Grandfather    Multiple myeloma Maternal Grandfather    Sleep apnea Maternal Grandfather    Hypertension Sister    Healthy Son    Asthma Daughter    Healthy Daughter    Colon cancer Neg Hx    Esophageal cancer Neg Hx    Stomach cancer Neg Hx    Rectal cancer Neg Hx     Social History   Socioeconomic History   Marital status: Unknown    Spouse name: Not on file   Number of children: Not on file   Years of education: Not on file   Highest education level: Not on file  Occupational History    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   Occupation: [employer]    Comment: [place]  Tobacco Use   Smoking status: Never   Smokeless tobacco: Never  Vaping Use   Vaping Use: Never used  Substance and Sexual Activity   Alcohol use: Yes    Comment: rarely   Drug use: No   Sexual activity: Yes  Other Topics Concern   Not  on file  Social History Narrative   epworth sleepiness scale = 18 (08/11/15)   Social Determinants of Health   Financial Resource Strain: Not on file  Food Insecurity: Not on file  Transportation Needs: Not on file  Physical Activity: Not on file  Stress: Not on file  Social Connections: Not on file  Intimate Partner Violence: Not on file    Outpatient Medications Prior to Visit  Medication Sig Dispense Refill   escitalopram (LEXAPRO) 20 MG tablet Take 1 tablet (20 mg total) by mouth daily. (Patient taking differently: Take 20 mg by mouth as needed.) 90 tablet 1   fexofenadine (ALLEGRA) 60 MG tablet Take by mouth.     rosuvastatin (CRESTOR) 10 MG tablet Take 1 tablet (10 mg total) by mouth daily. 90 tablet 2   solifenacin (VESICARE) 5 MG tablet TAKE 1 TABLET (5 MG TOTAL) BY MOUTH DAILY. (Patient taking differently: Take 5 mg by mouth as needed.) 90 tablet 1    spironolactone (ALDACTONE) 50 MG tablet Take 1 tablet (50 mg total) by mouth daily. 90 tablet 3   telmisartan-hydrochlorothiazide (MICARDIS HCT) 80-12.5 MG tablet Take 1 tablet by mouth daily. 90 tablet 3   No facility-administered medications prior to visit.    Allergies  Allergen Reactions   Norvasc [Amlodipine Besylate] Swelling   Aspirin Hives   Colchicine     Diarrhea    Metoprolol Other (See Comments)    Fatigue, somnolence    Minoxidil Other (See Comments)    Weight gain, extreme edema  Legs, feet, hands, lethargic     Other Other (See Comments)    Review of Systems  Constitutional:  Negative for fever.       (-)unexpected weight change (-)Adenopathy  HENT:  Negative for congestion, sinus pain and sore throat.   Eyes:        (-)Visual disturbance  Respiratory:  Negative for cough, shortness of breath and wheezing.   Cardiovascular:  Negative for chest pain, palpitations and leg swelling.  Gastrointestinal:  Negative for blood in stool, constipation, diarrhea, nausea and vomiting.  Genitourinary:  Negative for dysuria, frequency and hematuria.  Musculoskeletal:        (-)new muscle pain (-)new joint pain (+)pain in thumb  Skin:        (-)new moles  Neurological:  Negative for dizziness and headaches.  Psychiatric/Behavioral:  Positive for memory loss.        Objective:    Physical Exam Constitutional:      General: She is not in acute distress.    Appearance: Normal appearance. She is not ill-appearing.  HENT:     Head: Normocephalic and atraumatic.     Right Ear: Tympanic membrane, ear canal and external ear normal.     Left Ear: Tympanic membrane, ear canal and external ear normal.  Eyes:     Extraocular Movements: Extraocular movements intact.     Pupils: Pupils are equal, round, and reactive to light.  Cardiovascular:     Rate and Rhythm: Normal rate and regular rhythm.     Heart sounds: Normal heart sounds. No murmur heard.    No gallop.   Pulmonary:     Effort: Pulmonary effort is normal. No respiratory distress.     Breath sounds: Normal breath sounds. No wheezing or rales.  Abdominal:     General: There is no distension.     Palpations: Abdomen is soft.     Tenderness: There is no abdominal tenderness. There is no guarding.  Skin:    General: Skin is warm and dry.  Neurological:     Mental Status: She is alert and oriented to person, place, and time.  Psychiatric:        Judgment: Judgment normal.     LMP 06/20/2013  Wt Readings from Last 3 Encounters:  03/25/22 205 lb 9.6 oz (93.3 kg)  01/29/22 207 lb 12.8 oz (94.3 kg)  08/31/21 200 lb (90.7 kg)       Assessment & Plan:  There are no diagnoses linked to this encounter.  I, Shehryar [place], personally preformed the services described in this documentation.  All medical record entries made by the scribe were at my direction and in my presence.  I have reviewed the chart and discharge instructions (if applicable) and agree that the record reflects my personal performance and is accurate and complete. 08/06/2022   I,Shehryar Baig,acting as a scribe for Ann Held, DO.,have documented all relevant documentation on the behalf of Ann Held, DO,as directed by  Ann Held, DO while in the presence of Ann Held, DO.   Shehryar [employer]

## 2022-08-07 LAB — COMPREHENSIVE METABOLIC PANEL
ALT: 18 U/L (ref 0–35)
AST: 22 U/L (ref 0–37)
Albumin: 4 g/dL (ref 3.5–5.2)
Alkaline Phosphatase: 89 U/L (ref 39–117)
BUN: 14 mg/dL (ref 6–23)
CO2: 30 mEq/L (ref 19–32)
Calcium: 9.5 mg/dL (ref 8.4–10.5)
Chloride: 104 mEq/L (ref 96–112)
Creatinine, Ser: 0.91 mg/dL (ref 0.40–1.20)
GFR: 67.92 mL/min (ref >60.00)
Glucose, Bld: 84 mg/dL (ref 70–99)
Potassium: 3.9 mEq/L (ref 3.5–5.1)
Sodium: 140 mEq/L (ref 135–145)
Total Bilirubin: 0.5 mg/dL (ref 0.2–1.2)
Total Protein: 6.9 g/dL (ref 6.0–8.3)

## 2022-08-07 LAB — CBC WITH DIFFERENTIAL/PLATELET
Basophils Absolute: 0 10*3/uL (ref 0.0–0.1)
Basophils Relative: 0.6 % (ref 0.0–3.0)
Eosinophils Absolute: 0.1 10*3/uL (ref 0.0–0.7)
Eosinophils Relative: 1.6 % (ref 0.0–5.0)
HCT: 44 % (ref 36.0–46.0)
Hemoglobin: 14.3 g/dL (ref 12.0–15.0)
Lymphocytes Relative: 22.4 % (ref 12.0–46.0)
Lymphs Abs: 1.7 10*3/uL (ref 0.7–4.0)
MCHC: 32.6 g/dL (ref 30.0–36.0)
MCV: 85.3 fl (ref 78.0–100.0)
Monocytes Absolute: 0.6 10*3/uL (ref 0.1–1.0)
Monocytes Relative: 8.1 % (ref 3.0–12.0)
Neutro Abs: 5.2 10*3/uL (ref 1.4–7.7)
Neutrophils Relative %: 67.3 % (ref 43.0–77.0)
Platelets: 304 10*3/uL (ref 150.0–400.0)
RBC: 5.15 Mil/uL — ABNORMAL HIGH (ref 3.87–5.11)
RDW: 14 % (ref 11.5–15.5)
WBC: 7.7 10*3/uL (ref 4.0–10.5)

## 2022-08-07 LAB — LIPID PANEL
Cholesterol: 147 mg/dL (ref 0–200)
HDL: 53 mg/dL (ref >39.00)
LDL Cholesterol: 67 mg/dL (ref 0–99)
NonHDL: 93.91
Total CHOL/HDL Ratio: 3
Triglycerides: 134 mg/dL (ref 0.0–149.0)
VLDL: 26.8 mg/dL (ref 0.0–40.0)

## 2022-08-07 LAB — HEMOGLOBIN A1C: Hgb A1c MFr Bld: 6.3 % (ref 4.6–6.5)

## 2022-08-07 LAB — TSH: TSH: 0.64 u[IU]/mL (ref 0.35–5.50)

## 2022-08-08 ENCOUNTER — Encounter: Payer: Self-pay | Admitting: Family Medicine

## 2022-08-08 DIAGNOSIS — E785 Hyperlipidemia, unspecified: Secondary | ICD-10-CM | POA: Insufficient documentation

## 2022-08-08 NOTE — Assessment & Plan Note (Signed)
Ghm utd Check labs   Health Maintenance  Topic Date Due   HIV Screening  Never done   Hepatitis C Screening  Never done   Zoster Vaccines- Shingrix (1 of 2) Never done   MAMMOGRAM  06/09/2022   COVID-19 Vaccine (3 - Moderna risk series) 08/22/2022 (Originally 09/22/2019)   INFLUENZA VACCINE  12/05/2022   COLONOSCOPY (Pts 45-67yrs Insurance coverage will need to be confirmed)  08/05/2023   PAP SMEAR-Modifier  06/04/2024   DTaP/Tdap/Td (2 - Td or Tdap) 08/05/2032   HPV VACCINES  Aged Out

## 2022-08-08 NOTE — Assessment & Plan Note (Signed)
Well controlled, no changes to meds. Encouraged heart healthy diet such as the DASH diet and exercise as tolerated.  °

## 2022-09-22 ENCOUNTER — Other Ambulatory Visit: Payer: Self-pay | Admitting: Family Medicine

## 2022-09-22 DIAGNOSIS — N3281 Overactive bladder: Secondary | ICD-10-CM

## 2022-11-22 ENCOUNTER — Encounter: Payer: Self-pay | Admitting: Cardiology

## 2022-11-22 ENCOUNTER — Telehealth: Payer: Self-pay | Admitting: *Deleted

## 2022-11-22 ENCOUNTER — Ambulatory Visit: Payer: Managed Care, Other (non HMO) | Attending: Cardiology | Admitting: Cardiology

## 2022-11-22 VITALS — BP 112/64 | HR 64 | Ht 66.0 in | Wt 207.0 lb

## 2022-11-22 DIAGNOSIS — I10 Essential (primary) hypertension: Secondary | ICD-10-CM | POA: Diagnosis not present

## 2022-11-22 DIAGNOSIS — G4730 Sleep apnea, unspecified: Secondary | ICD-10-CM | POA: Diagnosis not present

## 2022-11-22 NOTE — Patient Instructions (Signed)
Medication Instructions:  Your physician recommends that you continue on your current medications as directed. Please refer to the Current Medication list given to you today.  *If you need a refill on your cardiac medications before your next appointment, please call your pharmacy*   Lab Work: None.  If you have labs (blood work) drawn today and your tests are completely normal, you will receive your results only by: MyChart Message (if you have MyChart) OR A paper copy in the mail If you have any lab test that is abnormal or we need to change your treatment, we will call you to review the results.   Testing/Procedures: Your physician has recommended that you have a sleep study. This test records several body functions during sleep, including: brain activity, eye movement, oxygen and carbon dioxide blood levels, heart rate and rhythm, breathing rate and rhythm, the flow of air through your mouth and nose, snoring, body muscle movements, and chest and belly movement.    Follow-Up: At [facility], you and your health needs are our priority.  As part of our continuing mission to provide you with exceptional heart care, we have created designated Provider Care Teams.  These Care Teams include your primary Cardiologist (physician) and Advanced Practice Providers (APPs -  Physician Assistants and Nurse Practitioners) who all work together to provide you with the care you need, when you need it.  We recommend signing up for the patient portal called "MyChart".  Sign up information is provided on this After Visit Summary.  MyChart is used to connect with patients for Virtual Visits (Telemedicine).  Patients are able to view lab/test results, encounter notes, upcoming appointments, etc.  Non-urgent messages can be sent to your provider as well.   To learn more about what you can do with MyChart, go to [email or web address].    Your next appointment will be dependent on your sleep study  results and it will be with:      Provider:   Dr. Armanda Magic, MD

## 2022-11-22 NOTE — Telephone Encounter (Signed)
Dr. Mayford Knife ordered an Donnie Coffin study for the pt today. Pt agreeable to signed waiver and not open the box until she has been called with the PIN#.

## 2022-11-22 NOTE — Progress Notes (Signed)
Sleep Medicine CONSULT Note    Date:  11/22/2022   ID:  Ariana Cohen, DOB 1961/04/28, MRN [id number]  PCP:  Zola Button, Grayling Congress, DO  Cardiologist: None   Chief Complaint  Patient presents with   New Patient (Initial Visit)    OSA    History of Present Illness:  Ariana Cohen is a 62 y.o. female who is being seen today for the evaluation of OSA at the request of Jari Favre, NP  This is a 62yo female with a hx of depression, HTN, anxiety and LVH.  She has a hx of OSA and had been on CPAP but has not been compliant with her device.  She is referred to sleep medicine for consultation to evaluate her OSA.  Her initial sleep study was in 2018 which showed no OSA with an [facility] of 4.8/hr. MSLT was done which showed only 1 early onset REM but short sleep latency.  She tells me she had a repeat sleep study done in 2019 in [facility] and that showed OSA and she was placed on CPAP. She has been using her CPAP sporadically since then .  She tells me that she thinks her current mask is 62 years old.  She had been using the nasal pillow mask but did not like it and then got a new mask but does not remember what it is like because she really has not been using her PAP.  She tells me that she does not feel tired when she first wakes up but then gets sleepy during the day and has to nap.  She has no AM HAs and no awakening gasping for breath or snoring.    Past Medical History:  Diagnosis Date   Allergy    Anxiety    Blood transfusion without reported diagnosis 2000   Depression    Fibroids    Heart murmur    Hypertension    Sleep apnea    has cpap doesn't use all the time   Urticaria     Past Surgical History:  Procedure Laterality Date   BREAST BIOPSY  2019   CESAREAN SECTION  1998; 2000   MYOMECTOMY  1992    Current Medications: Current Meds  Medication Sig   fexofenadine (ALLEGRA) 60 MG tablet Take by mouth.   naproxen (NAPROSYN) 500 MG tablet Take by mouth.    rosuvastatin (CRESTOR) 10 MG tablet Take 1 tablet (10 mg total) by mouth daily.   solifenacin (VESICARE) 5 MG tablet TAKE 1 TABLET (5 MG TOTAL) BY MOUTH DAILY.   spironolactone (ALDACTONE) 50 MG tablet Take 1 tablet (50 mg total) by mouth daily.   telmisartan-hydrochlorothiazide (MICARDIS HCT) 80-12.5 MG tablet Take 1 tablet by mouth daily.    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   Occupation: [employer]    Comment: [place]  Tobacco Use   Smoking status: Never   Smokeless tobacco: Never  Vaping Use   Vaping status: Never Used  Substance and Sexual Activity   Alcohol use: Yes    Comment: rarely   Drug use: No   Sexual activity: Yes  Other Topics Concern   Not on file  Social History Narrative   epworth sleepiness scale = 18 (08/11/15)   Social Determinants of Health   Financial Resource Strain: Not on file  Food Insecurity: Not on file  Transportation Needs: Not on file  Physical Activity: Not on file  Stress: Not on file  Social Connections: Not on file     Family History:  The patient's family history includes Asthma in her daughter; Healthy in her daughter and son; Heart attack in her maternal grandmother; Heart disease in her father; Hypertension in her father, maternal grandfather, maternal grandmother, mother, and sister; Multiple myeloma in her maternal grandfather; Renal Disease in her mother; Schizophrenia in her mother; Sleep apnea in her maternal grandfather; Thyroid disease in her maternal grandmother.   ROS:   Please see the history of present illness.    ROS All other systems reviewed and are negative.      No data to display              PHYSICAL EXAM:   VS:  BP 112/64   Pulse 64   Ht 5\' 6"  (1.676 m)   Wt 207 lb (93.9 kg)   LMP 06/20/2013   SpO2 95%   BMI 33.41 kg/m    GEN: Well nourished, well developed, in no acute distress  HEENT: normal  Neck: no JVD, carotid bruits, or masses Cardiac: RRR; no murmurs, rubs, or gallops,no edema.  Intact distal pulses bilaterally.  Respiratory:  clear to auscultation bilaterally, normal work of breathing GI: soft, nontender, nondistended, + BS MS: no deformity or atrophy  Skin: warm and dry, no rash Neuro:  Alert and Oriented x 3, Strength and sensation are intact Psych: euthymic mood, full affect  Wt Readings from Last 3 Encounters:  11/22/22 207 lb (93.9 kg)  08/06/22 209 lb 12.8 oz (95.2 kg)  03/25/22 205 lb 9.6 oz (93.3 kg)      Studies/Labs Reviewed:   none  Recent Labs: 08/06/2022: ALT 18; BUN 14; Creatinine, Ser 0.91; Hemoglobin 14.3; Platelets 304.0; Potassium 3.9; Sodium 140; TSH 0.64     Additional studies/ records that were reviewed today include:  none    ASSESSMENT:    1. Sleep apnea, unspecified type   2. Essential hypertension      PLAN:  In order of problems listed above:  OSA -she was dx with OSA in 2019 but at another facility and placed on CPAP  -she has been noncompliant with CPAP usage and has not been able to fine a mask that she likes -she is having daytime sleepiness and wants to get back on CPAP therapy -since it has been over 5 years since her last sleep test I would like to get a home sleep study and then based on the findings, either place on auto CPAP with new mask or send to sleep lab for titration.  2.  HTN -BP controlled on exam today -continue prescription drug management with Micardis HCT 80-12.5mg  daily and Spiro 50mg  daily with PRn refills  Followup PRN  Time Spent: 20 minutes total time of encounter, including 15 minutes spent in face-to-face patient care on the date of this encounter. This time includes  coordination of care and counseling regarding above mentioned problem list. Remainder of non-face-to-face time involved reviewing chart documents/testing relevant to the patient encounter and documentation in the medical record. I have independently reviewed documentation from referring provider  Medication Adjustments/Labs and Tests Ordered: Current medicines are reviewed at length  with the patient today.  Concerns regarding medicines are outlined above.  Medication changes, Labs and Tests ordered today are listed in the Patient Instructions below.     Signed, Armanda Magic, MD  11/22/2022 1:48 PM    [facility] [address] [place], [place], [place]  [address] Phone: [phone]; Fax: [phone]

## 2022-11-27 NOTE — Telephone Encounter (Addendum)
**Note De-Identified Lesslie Mossa Obfuscation** No answer so I left a message on the pts VM asking her to call Larita Fife back at Dr [place] office at [phone].

## 2022-11-27 NOTE — Telephone Encounter (Signed)
Prior Authorization for Automatic Data sent to [employer] via web portal. Tracking Number . No, prior authorization is not required

## 2022-11-28 NOTE — Telephone Encounter (Signed)
**Note De-Identified Marvis Bakken Obfuscation** No answer so I left a message on the pts VM asking her to call Larita Fife back, concerning the Watch Pat-HST device that we provided her with, at Dr [place] office at [phone].

## 2022-11-29 NOTE — Telephone Encounter (Signed)
**Note De-Identified Ariana Cohen Obfuscation** I called the pt provided her with the Pin #: "[id number]" so she can proceed with her Watch Pat-HST. She verbalized understanding and is aware to call us if she has any questions or concerns.

## 2022-12-19 ENCOUNTER — Ambulatory Visit: Payer: Managed Care, Other (non HMO) | Admitting: Family Medicine

## 2022-12-20 ENCOUNTER — Encounter (INDEPENDENT_AMBULATORY_CARE_PROVIDER_SITE_OTHER): Payer: Managed Care, Other (non HMO) | Admitting: Cardiology

## 2022-12-20 DIAGNOSIS — G4733 Obstructive sleep apnea (adult) (pediatric): Secondary | ICD-10-CM | POA: Diagnosis not present

## 2022-12-22 ENCOUNTER — Ambulatory Visit: Payer: Managed Care, Other (non HMO) | Attending: Cardiology

## 2022-12-22 DIAGNOSIS — G4730 Sleep apnea, unspecified: Secondary | ICD-10-CM

## 2022-12-22 NOTE — Procedures (Signed)
Patient Information Study Date: 12/19/2022 Patient Name: Ariana Cohen Patient ID: [id number] Birth Date: 29-Mar-1961 Age: 62 Gender: Female BMI: 32.5 (W=207 lb, H=5' 7'') Stopbang: 4 Referring Physician: Armanda Magic, MD  TEST DESCRIPTION: Home sleep apnea testing was completed using the WatchPat, a Type 1 device, utilizing peripheral arterial tonometry (PAT), chest movement, actigraphy, pulse oximetry, pulse rate, body position and snore. AHI was calculated with apnea and hypopnea using valid sleep time as the denominator. RDI includes apneas, hypopneas, and RERAs. The data acquired and the scoring of sleep and all associated events were performed in accordance with the recommended standards and specifications as outlined in the AASM Manual for the Scoring of Sleep and Associated Events 2.2.0 (2015).   FINDINGS:   1. Mild Obstructive Sleep Apnea with AHI 5.4/hr.   2. No Central Sleep Apnea with pAHIc 0.2/hr.   3. Oxygen desaturations as low as 86%.   4. Moderate snoring was present. O2 sats were < 88% for 0.4 min.   5. Total sleep time was 6 hrs and 7 min.   6. 22.8% of total sleep time was spent in REM sleep.   7. Normal sleep onset latency at 16 min.   8. Shortened REM sleep onset latency at 46 min.   9. Total awakenings were 2.  10. Arrhythmia detection:  None  DIAGNOSIS: Mild Obstructive Sleep Apnea (G47.33)  RECOMMENDATIONS:   1.  Clinical correlation of these findings is necessary.  The decision to treat obstructive sleep apnea (OSA) is usually based on the presence of apnea symptoms or the presence of associated medical conditions such as Hypertension, Congestive Heart Failure, Atrial Fibrillation or Obesity.  The most common symptoms of OSA are snoring, gasping for breath while sleeping, daytime sleepiness and fatigue.   2.  Initiating apnea therapy is recommended given the presence of symptoms and/or associated conditions. Recommend proceeding with one of the  following:     a.  Auto-CPAP therapy with a pressure range of 5-20cm H2O.     b.  An oral appliance (OA) that can be obtained from certain dentists with expertise in sleep medicine.  These are primarily of use in non-obese patients with mild and moderate disease.     c.  An ENT consultation which may be useful to look for specific causes of obstruction and possible treatment options.     d.  If patient is intolerant to PAP therapy, consider referral to ENT for evaluation for hypoglossal nerve stimulator.   3.  Close follow-up is necessary to ensure success with CPAP or oral appliance therapy for maximum benefit.  4.  A follow-up oximetry study on CPAP is recommended to assess the adequacy of therapy and determine the need for supplemental oxygen or the potential need for Bi-level therapy.  An arterial blood gas to determine the adequacy of baseline ventilation and oxygenation should also be considered.  5.  Healthy sleep recommendations include:  adequate nightly sleep (normal 7-9 hrs/night), avoidance of caffeine after noon and alcohol near bedtime, and maintaining a sleep environment that is cool, dark and quiet.  6.  Weight loss for overweight patients is recommended.  Even modest amounts of weight loss can significantly improve the severity of sleep apnea.  7.  Snoring recommendations include:  weight loss where appropriate, side sleeping, and avoidance of alcohol before bed.  8.  Operation of motor vehicle should be avoided when sleepy.  Signature: Armanda Magic, MD; [facility]; Diplomat, American Board of Sleep Medicine Electronically  Signed: 12/22/2022 8:04:09 PM

## 2022-12-23 ENCOUNTER — Telehealth: Payer: Self-pay | Admitting: *Deleted

## 2022-12-23 ENCOUNTER — Encounter: Payer: Self-pay | Admitting: Family Medicine

## 2022-12-23 ENCOUNTER — Ambulatory Visit: Payer: Managed Care, Other (non HMO) | Admitting: Family Medicine

## 2022-12-23 VITALS — BP 112/70 | HR 73 | Temp 97.6°F | Resp 18 | Ht 66.0 in | Wt 210.0 lb

## 2022-12-23 DIAGNOSIS — F411 Generalized anxiety disorder: Secondary | ICD-10-CM | POA: Diagnosis not present

## 2022-12-23 MED ORDER — FLUOXETINE HCL 20 MG PO CAPS: 20.0000 mg | ORAL_CAPSULE | Freq: Every day | ORAL | 3 refills | Status: DC

## 2022-12-23 NOTE — Telephone Encounter (Signed)
Pt was seen in office today. Genesight test was collected/ordered. Sample was packaged and placed up front for [employer] to pick up between 2-3pm. Consent and insurance info were in the package.   Confirmation number for pickup is [id number]  Tracking number (on package): [id number]

## 2022-12-23 NOTE — Progress Notes (Signed)
Established Patient Office Visit  Subjective   Patient ID: Ariana Cohen, female    DOB: 05-25-1960  Age: 63 y.o. MRN: [id number]  Chief Complaint  Patient presents with   Anxiety   Follow-up    HPI Discussed the use of AI scribe software for clinical note transcription with the patient, who gave verbal consent to proceed.  History of Present Illness   The patient, with a history of anxiety, presents requesting medication for her symptoms. She has previously tried Lexapro, Wellbutrin, and Buspar, but discontinued each due to either lack of efficacy or side effects. She reports a history of facial itching with one of the medications, but cannot recall which one. She has also seen a psychiatrist and a therapist in the past, but did not find these interventions helpful.  In addition to her anxiety, the patient has been experiencing issues with her thumb, for which she received a corticosteroid injection from an orthopedic specialist. She reports some improvement, but is considering a second injection. She also reports undergoing a home sleep study for suspected sleep apnea, but is unsure of the results.      Patient Active Problem List   Diagnosis Date Noted   Hyperlipidemia 08/08/2022   Trigger finger of right thumb 08/06/2022   Overactive bladder 01/31/2022   Skin nodule 08/31/2021   Vaginal itching 03/05/2020   History of sleep apnea 04/01/2018   Positive ANA (antinuclear antibody) 03/23/2018   Idiopathic chronic gout of multiple sites without tophus 02/27/2018   Hyperuricemia 01/21/2018   Rash 01/21/2018   Great toe pain, left 10/07/2017   Gout involving toe of left foot 08/25/2017   Transient alteration of awareness 10/01/2016   Memory loss 10/01/2016   Obesity (BMI 30.0-34.9) 08/12/2016   Onychomycosis 08/12/2016   Acute reaction to situational stress 07/01/2016   GAD (generalized anxiety disorder) 06/26/2016   Sleep apnea 06/12/2016   Snoring 06/11/2016   Other  fatigue 03/26/2016   Preventative health care 11/06/2015   Excessive daytime sleepiness 11/06/2015   Murmur 08/11/2015   PVCs (premature ventricular contractions) 08/11/2015   Shortness of breath 08/11/2015   Primary hypertension 08/11/2015   Past Medical History:  Diagnosis Date   Allergy    Anxiety    Blood transfusion without reported diagnosis 2000   Depression    Fibroids    Heart murmur    Hypertension    Sleep apnea    has cpap doesn't use all the time   Urticaria    Past Surgical History:  Procedure Laterality Date   BREAST BIOPSY  2019   CESAREAN SECTION  1998; 2000   MYOMECTOMY  1992   Social History   Tobacco Use   Smoking status: Never   Smokeless tobacco: Never  Vaping Use   Vaping status: Never Used  Substance Use Topics   Alcohol use: Yes    Comment: rarely   Drug use: No   Social History   Socioeconomic History   Marital status: Single    Spouse name: Not on file   Number of children: Not on file   Years of education: Not on file   Highest education level: Not on file  Occupational History    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   Occupation: [employer]    Comment: [employer]  Tobacco Use   Smoking status: Never   Smokeless tobacco: Never  Vaping Use   Vaping status: Never Used  Substance and Sexual Activity   Alcohol use: Yes  Comment: rarely   Drug use: No   Sexual activity: Yes  Other Topics Concern   Not on file  Social History Narrative   epworth sleepiness scale = 18 (08/11/15)   Social Determinants of Health   Financial Resource Strain: Not on file  Food Insecurity: Not on file  Transportation Needs: Not on file  Physical Activity: Not on file  Stress: Not on file  Social Connections: Not on file  Intimate Partner Violence: Not on file   Family Status  Relation Name Status   Mother  Alive   Father  Deceased   MGM  Deceased   MGF  Deceased   Sister  Alive   Brother  Alive   Son  Alive   Daughter  Alive   Neg Hx  (Not  Specified)  No partnership data on file   Family History  Problem Relation Age of Onset   Hypertension Mother    Renal Disease Mother    Schizophrenia Mother    Hypertension Father    Heart disease Father        also OSA   Hypertension Maternal Grandmother    Heart attack Maternal Grandmother    Thyroid disease Maternal Grandmother        thyroidectomy   Hypertension Maternal Grandfather    Multiple myeloma Maternal Grandfather    Sleep apnea Maternal Grandfather    Hypertension Sister    Healthy Son    Asthma Daughter    Healthy Daughter    Colon cancer Neg Hx    Esophageal cancer Neg Hx    Stomach cancer Neg Hx    Rectal cancer Neg Hx    Allergies  Allergen Reactions   Norvasc [Amlodipine Besylate] Swelling   Aspirin Hives   Colchicine     Diarrhea    Metoprolol Other (See Comments)    Fatigue, somnolence    Minoxidil Other (See Comments)    Weight gain, extreme edema  Legs, feet, hands, lethargic     Other Other (See Comments)      Review of Systems  Constitutional:  Negative for chills, fever and malaise/fatigue.  HENT:  Negative for congestion and hearing loss.   Eyes:  Negative for blurred vision and discharge.  Respiratory:  Negative for cough, sputum production and shortness of breath.   Cardiovascular:  Negative for chest pain, palpitations and leg swelling.  Gastrointestinal:  Negative for abdominal pain, blood in stool, constipation, diarrhea, heartburn, nausea and vomiting.  Genitourinary:  Negative for dysuria, frequency, hematuria and urgency.  Musculoskeletal:  Negative for back pain, falls and myalgias.  Skin:  Negative for rash.  Neurological:  Negative for dizziness, sensory change, loss of consciousness, weakness and headaches.  Endo/Heme/Allergies:  Negative for environmental allergies. Does not bruise/bleed easily.  Psychiatric/Behavioral:  Negative for depression and suicidal ideas. The patient is not nervous/anxious and does not have  insomnia.       Objective:     BP 112/70 (BP Location: Left Arm, Patient Position: Sitting, Cuff Size: Large)   Pulse 73   Temp 97.6 F (36.4 C) (Oral)   Resp 18   Ht 5\' 6"  (1.676 m)   Wt 210 lb (95.3 kg)   LMP 06/20/2013   SpO2 96%   BMI 33.89 kg/m  BP Readings from Last 3 Encounters:  12/23/22 112/70  11/22/22 112/64  08/06/22 118/72   Wt Readings from Last 3 Encounters:  12/23/22 210 lb (95.3 kg)  11/22/22 207 lb (93.9 kg)  08/06/22 209  lb 12.8 oz (95.2 kg)   SpO2 Readings from Last 3 Encounters:  12/23/22 96%  11/22/22 95%  08/06/22 95%      Physical Exam Vitals and nursing note reviewed.  Constitutional:      General: She is not in acute distress.    Appearance: Normal appearance. She is well-developed.  HENT:     Head: Normocephalic and atraumatic.  Eyes:     General: No scleral icterus.       Right eye: No discharge.        Left eye: No discharge.  Cardiovascular:     Rate and Rhythm: Normal rate and regular rhythm.     Heart sounds: No murmur heard. Pulmonary:     Effort: Pulmonary effort is normal. No respiratory distress.     Breath sounds: Normal breath sounds.  Musculoskeletal:        General: Normal range of motion.     Cervical back: Normal range of motion and neck supple.     Right lower leg: No edema.     Left lower leg: No edema.  Skin:    General: Skin is warm and dry.  Neurological:     Mental Status: She is alert and oriented to person, place, and time.  Psychiatric:        Mood and Affect: Mood normal.        Behavior: Behavior normal.        Thought Content: Thought content normal.        Judgment: Judgment normal.      No results found for any visits on 12/23/22.  Last CBC Lab Results  Component Value Date   WBC 7.7 08/06/2022   HGB 14.3 08/06/2022   HCT 44.0 08/06/2022   MCV 85.3 08/06/2022   MCH 27.4 08/31/2021   RDW 14.0 08/06/2022   PLT 304.0 08/06/2022   Last metabolic panel Lab Results  Component Value  Date   GLUCOSE 84 08/06/2022   NA 140 08/06/2022   K 3.9 08/06/2022   CL 104 08/06/2022   CO2 30 08/06/2022   BUN 14 08/06/2022   CREATININE 0.91 08/06/2022   GFR 67.92 08/06/2022   CALCIUM 9.5 08/06/2022   PROT 6.9 08/06/2022   ALBUMIN 4.0 08/06/2022   BILITOT 0.5 08/06/2022   ALKPHOS 89 08/06/2022   AST 22 08/06/2022   ALT 18 08/06/2022   ANIONGAP 8 02/03/2021   Last lipids Lab Results  Component Value Date   CHOL 147 08/06/2022   HDL 53.00 08/06/2022   LDLCALC 67 08/06/2022   TRIG 134.0 08/06/2022   CHOLHDL 3 08/06/2022   Last hemoglobin A1c Lab Results  Component Value Date   HGBA1C 6.3 08/06/2022   Last thyroid functions Lab Results  Component Value Date   TSH 0.64 08/06/2022   Last vitamin D No results found for: "25OHVITD2", "25OHVITD3", "VD25OH" Last vitamin B12 and Folate Lab Results  Component Value Date   VITAMINB12 343 10/02/2016      The 10-year ASCVD risk score (Arnett DK, et al., 2019) is: 4.1%    Assessment & Plan:   Problem List Items Addressed This Visit   None Visit Diagnoses     Generalized anxiety disorder    -  Primary   Relevant Medications   FLUoxetine (PROZAC) 20 MG capsule     Assessment and Plan    Generalized Anxiety Disorder History of unsuccessful trials of Lexapro, Wellbutrin, and Buspar. Patient is seeking a new medication for management. Discussed the importance  of giving medications adequate time to take effect (up to 2 months) unless side effects occur. -Order genetic testing to guide medication selection. -Prescribe Prozac, with the understanding that the patient may choose to wait for genetic test results before starting.  Obstructive Sleep Apnea Mild OSA diagnosed via home sleep study. Patient is awaiting further consultation with sleep specialist regarding treatment options. -Advise patient to follow up with sleep specialist regarding CPAP or oral appliance therapy.  Follow-up in October for routine labs.         No follow-ups on file.    Donato Schultz, DO

## 2022-12-24 ENCOUNTER — Telehealth: Payer: Self-pay

## 2022-12-24 DIAGNOSIS — I10 Essential (primary) hypertension: Secondary | ICD-10-CM

## 2022-12-24 DIAGNOSIS — G4733 Obstructive sleep apnea (adult) (pediatric): Secondary | ICD-10-CM

## 2022-12-24 NOTE — Telephone Encounter (Signed)
-----   Message from Armanda Magic sent at 12/22/2022  8:06 PM EDT ----- Please let patient know that they have sleep apnea and recommend treating with CPAP.  Please order an auto CPAP from 4-15cm H2O with heated humidity and mask of choice.  Order overnight pulse ox on CPAP.  Followup with me in 6 weeks.

## 2022-12-24 NOTE — Telephone Encounter (Signed)
Notified patient of sleep study results and recommendations. All questions (if any) were answered. Patient verbalized  understanding. New CPAP order sent to [facility] today 12/24/22.

## 2023-01-14 ENCOUNTER — Encounter: Payer: Self-pay | Admitting: Family Medicine

## 2023-01-15 ENCOUNTER — Other Ambulatory Visit: Payer: Self-pay | Admitting: Family Medicine

## 2023-01-15 DIAGNOSIS — F411 Generalized anxiety disorder: Secondary | ICD-10-CM

## 2023-01-29 NOTE — Telephone Encounter (Signed)
Fax came from [facility] stating they have been unable to contact the patient to schedule set up after several attempts and have sent an unable to contact letter.

## 2023-03-20 ENCOUNTER — Other Ambulatory Visit: Payer: Self-pay | Admitting: Physician Assistant

## 2023-04-04 ENCOUNTER — Other Ambulatory Visit: Payer: Self-pay | Admitting: Physician Assistant

## 2023-04-14 ENCOUNTER — Other Ambulatory Visit: Payer: Self-pay | Admitting: Family Medicine

## 2023-04-14 DIAGNOSIS — F411 Generalized anxiety disorder: Secondary | ICD-10-CM

## 2023-05-06 ENCOUNTER — Other Ambulatory Visit: Payer: Self-pay | Admitting: Physician Assistant

## 2023-05-06 ENCOUNTER — Ambulatory Visit: Payer: Self-pay | Admitting: Family Medicine

## 2023-05-06 NOTE — Telephone Encounter (Signed)
 Copied from CRM [id number]. Topic: Clinical - Red Word Triage >> May 06, 2023  2:18 PM Drema MATSU wrote: Red Word that prompted transfer to Nurse Triage: Patient states that she is feeling weak, faint and no energy. Patient thinks her blood sugar is too low. (Has not checked it)  Chief Complaint: low blood sugars Symptoms: feel weak, faint, no energy yesterday and last couple of weeks pt thinks FSBS have been low - ususally average around 100 Frequency: yesterday and last couple of weeks -= pt found out she is prediabetic Pertinent Negatives: Patient denies has not checked FSBS today and feels good today Disposition: [] ED /[] Urgent Care (no appt availability in office) / [] Appointment(In office/virtual)/ []  [facility]/ [x] Home Care/ [] Refused Recommended Disposition /[] [facility]/ []  Follow-up with PCP Additional Notes: nurse provided home care and encouraged if s/s resume go to Urgent or ED if office not open.    Reason for Disposition  [1] Blood glucose 70 mg/dl (3.9 mmol/l) or below, OR symptomatic AND [2] cause known  Answer Assessment - Initial Assessment Questions 1. SYMPTOMS: What symptoms are you concerned about?     Weak, feeling faint, low energy, think related to low blood sugar 2. ONSET:  When did the symptoms start?     Going several weeks - PCP told pt she was pre-DM 3. BLOOD GLUCOSE: What is your blood glucose level?      Pt usually averages around 100 FSBS - checked yesterday it was 100 and day before 100,  124 4. USUAL RANGE: What is your blood glucose level usually? (e.g., usual fasting morning value, usual evening value)     Normal average around 100 5. TYPE 1 or 2:  Do you know what type of diabetes you have?  (e.g., Type 1, Type 2, Gestational; doesn't know)     Type 2 6. INSULIN: Do you take insulin? What type of insulin(s) do you use? What is the mode of delivery? (syringe, pen; injection or pump) When did you last give yourself  an insulin dose? (i.e., time or hours/minutes ago) How much did you give? (i.e., how many units)     N/a 7. DIABETES PILLS: Do you take any pills for your diabetes? If Yes, ask: What is the name of the medicine(s) that you take for high blood sugar?     N/a 8. OTHER SYMPTOMS: Do you have any symptoms? (e.g., fever, frequent urination, difficulty breathing, vomiting)     Has overactive bladder 9. LOW BLOOD GLUCOSE TREATMENT: What have you done so far to treat the low blood glucose level? N/a 10. FOOD: When did you last eat or drink?     Pt not eating on regular basis - nurse encouraged to eat daily meals and snacks to maintain blood sugars 11. ALONE: Are you alone right now or is someone with you?        N/a 12. PREGNANCY: Is there any chance you are pregnant? When was your last menstrual period?       unknown  Protocols used: Diabetes - Low Blood Sugar-A-AH

## 2023-05-16 ENCOUNTER — Ambulatory Visit (INDEPENDENT_AMBULATORY_CARE_PROVIDER_SITE_OTHER): Payer: Managed Care, Other (non HMO) | Admitting: Family Medicine

## 2023-05-16 ENCOUNTER — Encounter: Payer: Self-pay | Admitting: Family Medicine

## 2023-05-16 VITALS — BP 108/88 | HR 70 | Temp 97.9°F | Resp 18 | Ht 66.0 in | Wt 212.4 lb

## 2023-05-16 DIAGNOSIS — R5383 Other fatigue: Secondary | ICD-10-CM | POA: Diagnosis not present

## 2023-05-16 DIAGNOSIS — Z6834 Body mass index (BMI) 34.0-34.9, adult: Secondary | ICD-10-CM | POA: Diagnosis not present

## 2023-05-16 DIAGNOSIS — F419 Anxiety disorder, unspecified: Secondary | ICD-10-CM

## 2023-05-16 DIAGNOSIS — Z23 Encounter for immunization: Secondary | ICD-10-CM | POA: Diagnosis not present

## 2023-05-16 LAB — LIPID PANEL
Cholesterol: 149 mg/dL (ref 0–200)
HDL: 48.1 mg/dL (ref >39.00)
LDL Cholesterol: 88 mg/dL (ref 0–99)
NonHDL: 100.75
Total CHOL/HDL Ratio: 3
Triglycerides: 64 mg/dL (ref 0.0–149.0)
VLDL: 12.8 mg/dL (ref 0.0–40.0)

## 2023-05-16 LAB — CBC WITH DIFFERENTIAL/PLATELET
Basophils Absolute: 0 10*3/uL (ref 0.0–0.1)
Basophils Relative: 0.1 % (ref 0.0–3.0)
Eosinophils Absolute: 0.1 10*3/uL (ref 0.0–0.7)
Eosinophils Relative: 1.4 % (ref 0.0–5.0)
HCT: 45.3 % (ref 36.0–46.0)
Hemoglobin: 14.5 g/dL (ref 12.0–15.0)
Lymphocytes Relative: 21.4 % (ref 12.0–46.0)
Lymphs Abs: 1.4 10*3/uL (ref 0.7–4.0)
MCHC: 31.9 g/dL (ref 30.0–36.0)
MCV: 85.7 fL (ref 78.0–100.0)
Monocytes Absolute: 0.5 10*3/uL (ref 0.1–1.0)
Monocytes Relative: 7.6 % (ref 3.0–12.0)
Neutro Abs: 4.6 10*3/uL (ref 1.4–7.7)
Neutrophils Relative %: 69.5 % (ref 43.0–77.0)
Platelets: 341 10*3/uL (ref 150.0–400.0)
RBC: 5.29 Mil/uL — ABNORMAL HIGH (ref 3.87–5.11)
RDW: 14.5 % (ref 11.5–15.5)
WBC: 6.7 10*3/uL (ref 4.0–10.5)

## 2023-05-16 LAB — COMPREHENSIVE METABOLIC PANEL
ALT: 31 U/L (ref 0–35)
AST: 25 U/L (ref 0–37)
Albumin: 4.2 g/dL (ref 3.5–5.2)
Alkaline Phosphatase: 88 U/L (ref 39–117)
BUN: 16 mg/dL (ref 6–23)
CO2: 29 meq/L (ref 19–32)
Calcium: 9.4 mg/dL (ref 8.4–10.5)
Chloride: 102 meq/L (ref 96–112)
Creatinine, Ser: 0.73 mg/dL (ref 0.40–1.20)
GFR: 88 mL/min (ref >60.00)
Glucose, Bld: 101 mg/dL — ABNORMAL HIGH (ref 70–99)
Potassium: 4 meq/L (ref 3.5–5.1)
Sodium: 140 meq/L (ref 135–145)
Total Bilirubin: 0.7 mg/dL (ref 0.2–1.2)
Total Protein: 7.1 g/dL (ref 6.0–8.3)

## 2023-05-16 LAB — VITAMIN B12: Vitamin B-12: 248 pg/mL (ref 211–911)

## 2023-05-16 LAB — VITAMIN D 25 HYDROXY (VIT D DEFICIENCY, FRACTURES): VITD: 55.27 ng/mL (ref 30.00–100.00)

## 2023-05-16 LAB — TSH: TSH: 1.13 u[IU]/mL (ref 0.35–5.50)

## 2023-05-16 LAB — HEMOGLOBIN A1C: Hgb A1c MFr Bld: 6.6 % — ABNORMAL HIGH (ref 4.6–6.5)

## 2023-05-16 MED ORDER — BUSPIRONE HCL 7.5 MG PO TABS: 7.5000 mg | ORAL_TABLET | Freq: Two times a day (BID) | ORAL | 0 refills | Status: DC

## 2023-05-16 NOTE — Progress Notes (Signed)
 Established Patient Office Visit  Subjective   Patient ID: Ariana Cohen, female    DOB: 07-29-1960  Age: 63 y.o. MRN: [id number]  Chief Complaint  Patient presents with   Hypoglycemia   Hypertension   Fatigue    HPI Discussed the use of AI scribe software for clinical note transcription with the patient, who gave verbal consent to proceed.  History of Present Illness   The patient, with a history of anxiety, presents with concerns about fluctuating glucose levels. She reports feeling unwell, with symptoms of lethargy and nausea, particularly when she has not eaten for an extended period. She has been monitoring her glucose levels at home, which are usually within the normal range, but has noticed occasional elevations after meals. She also reports an episode of near syncope at work, which she attributes to not eating enough.  The patient recently recovered from COVID-19, which she contracted in November. She describes the recovery as slow and challenging, with persistent fatigue. Around the same time, she experienced the loss of her mother, which has been emotionally difficult.  In an attempt to manage her health better, the patient has joined a gym and is seeking assistance with a wellness program to improve her eating habits. She expresses a fear of developing diabetes and a particular aversion to the medication metformin.  The patient also mentions a recent Pap smear, performed during her last physical, and a mammogram, which she undergoes annually. She expresses concern about a coworker recently diagnosed with colon cancer, indicating a heightened awareness of her own health.      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 tophus 02/27/2018   Hyperuricemia 01/21/2018   Rash 01/21/2018   Great toe pain, left 10/07/2017   Gout involving toe of left foot 08/25/2017   Transient alteration of awareness 10/01/2016   Memory loss 10/01/2016   Obesity (BMI 30.0-34.9) 08/12/2016   Onychomycosis 08/12/2016   Acute reaction to situational stress 07/01/2016   GAD (generalized anxiety disorder) 06/26/2016   Sleep apnea 06/12/2016   Snoring 06/11/2016   Other fatigue 03/26/2016   Preventative health care 11/06/2015   Excessive daytime sleepiness 11/06/2015   Murmur 08/11/2015   PVCs (premature ventricular contractions) 08/11/2015   Shortness of breath 08/11/2015   Primary hypertension 08/11/2015   Past Medical History:  Diagnosis Date   Allergy    Anxiety    Blood transfusion without reported diagnosis 2000   Depression    Fibroids    Heart murmur    Hypertension    Sleep apnea    has cpap doesn't use all the time   Urticaria    Past Surgical History:  Procedure Laterality Date   BREAST BIOPSY  2019   CESAREAN SECTION  1998; 2000   MYOMECTOMY  1992   Social History   Tobacco Use   Smoking status: Never   Smokeless tobacco: Never  Vaping Use   Vaping status: Never Used  Substance Use Topics   Alcohol use: Yes    Comment: rarely   Drug use: No   Social History   Socioeconomic History   Marital status: Single    Spouse name: Not on file  Number of children: Not on file   Years of education: Not on file   Highest education level: Not on file  Occupational History    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   Occupation: [employer]    Comment: [place]  Tobacco Use   Smoking status: Never   Smokeless tobacco: Never  Vaping Use   Vaping status: Never Used  Substance and Sexual Activity   Alcohol use: Yes    Comment: rarely   Drug use: No   Sexual activity: Yes  Other Topics Concern   Not on file  Social History Narrative   epworth  sleepiness scale = 18 (08/11/15)   Social Drivers of Corporate Investment Banker Strain: Not on file  Food Insecurity: Not on file  Transportation Needs: Not on file  Physical Activity: Not on file  Stress: Not on file  Social Connections: Not on file  Intimate Partner Violence: Not on file   Family Status  Relation Name Status   Mother  Deceased at age 39   Father  Deceased   Sister  Alive   Brother  Alive   MGM  Deceased   MGF  Deceased   Daughter  Alive   Son  Alive   Neg Hx  (Not Specified)  No partnership data on file   Family History  Problem Relation Age of Onset   Hypertension Mother    Renal Disease Mother    Schizophrenia Mother    Dementia Mother    Hypertension Father    Heart disease Father        also OSA   Hypertension Sister    Hypertension Maternal Grandmother    Heart attack Maternal Grandmother    Thyroid disease Maternal Grandmother        thyroidectomy   Hypertension Maternal Grandfather    Multiple myeloma Maternal Grandfather    Sleep apnea Maternal Grandfather    Asthma Daughter    Healthy Daughter    Healthy Son    Colon cancer Neg Hx    Esophageal cancer Neg Hx    Stomach cancer Neg Hx    Rectal cancer Neg Hx    Allergies  Allergen Reactions   Norvasc [Amlodipine Besylate] Swelling   Aspirin Hives   Colchicine      Diarrhea    Metoprolol Other (See Comments)    Fatigue, somnolence    Minoxidil  Other (See Comments)    Weight gain, extreme edema  Legs, feet, hands, lethargic     Other Other (See Comments)      Review of Systems  Constitutional:  Positive for malaise/fatigue. Negative for fever.  HENT:  Negative for congestion.   Eyes:  Negative for blurred vision.  Respiratory:  Negative for cough and shortness of breath.   Cardiovascular:  Negative for chest pain, palpitations and leg swelling.  Gastrointestinal:  Negative for vomiting.  Musculoskeletal:  Negative for back pain.  Skin:  Negative for rash.  Neurological:   Negative for loss of consciousness and headaches.  Psychiatric/Behavioral:  Negative for depression, hallucinations, substance abuse and suicidal ideas. The patient is nervous/anxious. The patient does not have insomnia.       Objective:     BP 108/88 (BP Location: Left Arm, Patient Position: Sitting)   Pulse 70   Temp 97.9 F (36.6 C) (Oral)   Resp 18   Ht 5' 6 (1.676 m)   Wt 212 lb 6.4 oz (96.3 kg)   LMP 06/20/2013   SpO2 95%  BMI 34.28 kg/m  BP Readings from Last 3 Encounters:  05/16/23 108/88  12/23/22 112/70  11/22/22 112/64   Wt Readings from Last 3 Encounters:  05/16/23 212 lb 6.4 oz (96.3 kg)  12/23/22 210 lb (95.3 kg)  11/22/22 207 lb (93.9 kg)   SpO2 Readings from Last 3 Encounters:  05/16/23 95%  12/23/22 96%  11/22/22 95%      Physical Exam Vitals and nursing note reviewed.  Constitutional:      General: She is not in acute distress.    Appearance: Normal appearance. She is well-developed.  HENT:     Cohen: Normocephalic and atraumatic.  Eyes:     General: No scleral icterus.       Right eye: No discharge.        Left eye: No discharge.  Cardiovascular:     Rate and Rhythm: Normal rate and regular rhythm.     Heart sounds: No murmur heard. Pulmonary:     Effort: Pulmonary effort is normal. No respiratory distress.     Breath sounds: Normal breath sounds.  Musculoskeletal:        General: Normal range of motion.     Cervical back: Normal range of motion and neck supple.     Right lower leg: No edema.     Left lower leg: No edema.  Skin:    General: Skin is warm and dry.  Neurological:     Mental Status: She is alert and oriented to person, place, and time.  Psychiatric:        Mood and Affect: Mood is anxious. Affect is flat. Affect is not tearful.        Speech: Speech normal.        Behavior: Behavior normal.        Thought Content: Thought content normal. Thought content does not include homicidal or suicidal ideation. Thought content  does not include homicidal or suicidal plan.        Cognition and Memory: Cognition normal.        Judgment: Judgment normal.      No results found for any visits on 05/16/23.  Last CBC Lab Results  Component Value Date   WBC 7.7 08/06/2022   HGB 14.3 08/06/2022   HCT 44.0 08/06/2022   MCV 85.3 08/06/2022   MCH 27.4 08/31/2021   RDW 14.0 08/06/2022   PLT 304.0 08/06/2022   Last metabolic panel Lab Results  Component Value Date   GLUCOSE 84 08/06/2022   NA 140 08/06/2022   K 3.9 08/06/2022   CL 104 08/06/2022   CO2 30 08/06/2022   BUN 14 08/06/2022   CREATININE 0.91 08/06/2022   GFR 67.92 08/06/2022   CALCIUM  9.5 08/06/2022   PROT 6.9 08/06/2022   ALBUMIN 4.0 08/06/2022   BILITOT 0.5 08/06/2022   ALKPHOS 89 08/06/2022   AST 22 08/06/2022   ALT 18 08/06/2022   ANIONGAP 8 02/03/2021   Last lipids Lab Results  Component Value Date   CHOL 147 08/06/2022   HDL 53.00 08/06/2022   LDLCALC 67 08/06/2022   TRIG 134.0 08/06/2022   CHOLHDL 3 08/06/2022   Last hemoglobin A1c Lab Results  Component Value Date   HGBA1C 6.3 08/06/2022   Last thyroid functions Lab Results  Component Value Date   TSH 0.64 08/06/2022   Last vitamin D  No results found for: 25OHVITD2, 25OHVITD3, VD25OH Last vitamin B12 and Folate Lab Results  Component Value Date   VITAMINB12 343 10/02/2016  The 10-year ASCVD risk score (Arnett DK, et al., 2019) is: 3.7%    Assessment & Plan:   Problem List Items Addressed This Visit   None Visit Diagnoses       Fatigue, unspecified type    -  Primary   Relevant Orders   CBC with Differential/Platelet   Comprehensive metabolic panel   Lipid panel   TSH   Hemoglobin A1c   VITAMIN D  25 Hydroxy (Vit-D Deficiency, Fractures)   Vitamin B12     Morbid obesity (HCC)       Relevant Orders   Amb Ref to Medical Weight Management     Anxiety       Relevant Medications   busPIRone  (BUSPAR ) 7.5 MG tablet     Need for influenza  vaccination         Need for pneumococcal 20-valent conjugate vaccination       Relevant Orders   Pneumococcal conjugate vaccine 20-valent (Prevnar 20)     Assessment and Plan    Hypoglycemia   She reports queasiness and lethargy when not eating regularly, with blood glucose levels generally normal but occasionally dropping, causing symptoms. She experienced near syncope at work after extended fasting, which may be contributed to by a recent COVID-19 infection and stress from her mother's death. We discussed the importance of eating five smaller meals per day and keeping quick sources of glucose like fruit or glucose tablets on hand, noting that glucose tablets can cause blood sugar spikes. We will order a blood glucose test, advise eating five smaller meals per day, recommend keeping quick sources of glucose on hand, and check thyroid function.  Anxiety   She has long-standing anxiety and was previously on Prozac , which was ineffective. A DNA test indicated Buspar  (buspirone ) as a suitable alternative, despite past side effects, but she is willing to retry. We discussed Wellbutrin  as another option, which may increase anxiety. We will prescribe Buspar  (buspirone ) starting at the lowest dose, follow up in one month to assess effectiveness and side effects, and increase the dose if well-tolerated.  General Health Maintenance   She is due for routine screenings and vaccinations, with blood pressure well-controlled with current medication. We will administer the flu shot and pneumococcal vaccine, schedule a colonoscopy for April, schedule a physical for April or May, schedule a mammogram, and refer to a gynecologist for a Pap smear.  Follow-up   We will follow up in one month for Buspar  assessment and review lab results, determining further follow-up based on the results.        Return if symptoms worsen or fail to improve, for annual exam, fasting.    Skyleen Bentley R Lowne Chase, DO

## 2023-05-16 NOTE — Patient Instructions (Signed)
 Hypoglycemia Hypoglycemia is when the amount of sugar, or glucose, in your blood is too low. Low blood sugar can happen if you have diabetes or if you don't have diabetes. It may be an emergency. What are the causes? Low blood sugar happens most often in people who have diabetes. It may be caused by: Diabetes medicine. Not eating enough, or not eating often enough. Being more active than normal. If you don't have diabetes, you may still get low blood sugar if: There's a tumor in your pancreas. A tumor is a growth of cells that isn't normal. You don't eat enough, or you fast. Fasting is when you don't eat for long periods at a time. You have a bad infection or illness. You have problems after weight loss surgery. You have kidney or liver problems. You take certain medicines. What increases the risk? You're more likely to have low blood sugar if: You have diabetes and take medicine for it. You drink a lot of alcohol. You get sick. What are the signs or symptoms? Mild Hunger or feeling like you may vomit. Sweating and feeling cold to the touch. Feeling dizzy or light-headed. Being sleepy or having trouble sleeping. A headache. Blurry vision. Mood changes. These include feeling worried, nervous, or easily annoyed. Moderate Feeling confused. Changes in the way you act. Weakness. An uneven heartbeat. Very bad Having very low blood sugar is an emergency. It can cause: Fainting. Seizures. A coma. Death. How is this diagnosed?  Low blood sugar can be found with a blood test. This test tells you how much sugar is in your blood. It's done while you're having symptoms. Your health care provider may also do an exam and look at your medical history. How is this treated? Treating low blood sugar If you have low blood sugar, eat or drink something with sugar in it right away. The food or drink should have 15 grams of a fast-acting carbohydrate (carb). Options include: 4 oz (120 mL) of  fruit juice. 4 oz (120 mL) of soda (not diet soda). A few pieces of hard candy. Check food labels to see how many pieces to eat. 1 Tbsp (15 mL) of sugar or honey. 4 glucose tablets. 1 tube of glucose gel. Treating low blood sugar if you have diabetes Talk with your provider about how much carb you should take. If you're alert and can swallow safely, you may follow the 15:15 rule: Take 15 grams of a fast-acting carb. Check your blood sugar 15 minutes after you take the carb. If your blood sugar is still at or below 70 mg/dL (3.9 mmol/L), take 15 grams of a carb again. If your blood sugar doesn't go above 70 mg/dL (3.9 mmol/L) after 3 tries, get help right away. After your blood sugar goes back to normal, eat a meal or a snack within 1 hour. Always keep 15 grams of a fast-acting carb with you. This could be: 4 glucose tablets. A few pieces of hard candy. 1 Tbsp (15 mL) of honey or sugar. 1 tube of glucose gel. Treating very low blood sugar If your blood sugar is less than 54 mg/dL (3 mmol/L), it's an emergency. Get help right away. If you can't eat or drink, you will need to be given glucagon. A family member or friend should learn how to check your blood sugar and give you glucagon. Ask your provider if you should keep a glucagon kit at home. You may also need to be treated in a hospital. Follow  these instructions at home: If you have diabetes: Always keep a fast-acting carb (15 grams) with you. Follow your diabetes care plan. Make sure you: Know the symptoms of low blood sugar. Check your blood sugar as often as told. Always check it before and after you exercise. Always check your blood sugar before you drive. Take your medicines as told. Eat on time. Do not skip meals. Share your diabetes care plan with: Your work or school. The people you live with. Wear an alert bracelet or carry a card that says you have diabetes. General instructions If you drink alcohol: Limit how much you  have to: 0-1 drink a day if you're female. 0-2 drinks a day if you're female. Know how much alcohol is in your drink. In the [place] one drink is one 12 oz bottle of beer (355 mL), one 5 oz glass of wine (148 mL), or one 1 oz glass of hard liquor (44 mL). Be sure to eat food when you drink alcohol. Be sure to check your blood sugar after you drink. Alcohol may lead to low blood sugar later. Where to find more information American Diabetes Association (ADA): diabetes.org Contact a health care provider if: You have low blood sugar often. You have diabetes and are having trouble keeping your blood sugar in the right range. Get help right away if: You can't get your blood sugar above 70 mg/dL (3.9 mmol/L) after 3 tries. Your blood sugar is below 54 mg/dL (3 mmol/L). You have a seizure. You faint. These symptoms may be an emergency. Call 911 right away. Do not wait to see if the symptoms will go away. Do not drive yourself to the hospital. This information is not intended to replace advice given to you by your health care provider. Make sure you discuss any questions you have with your health care provider. Document Revised: 07/10/2022 Document Reviewed: 07/10/2022 Elsevier Patient Education  2024 [employer].

## 2023-05-25 ENCOUNTER — Encounter: Payer: Self-pay | Admitting: Family Medicine

## 2023-06-10 ENCOUNTER — Other Ambulatory Visit: Payer: Self-pay | Admitting: Family Medicine

## 2023-06-10 DIAGNOSIS — F419 Anxiety disorder, unspecified: Secondary | ICD-10-CM

## 2023-06-19 ENCOUNTER — Ambulatory Visit: Payer: Managed Care, Other (non HMO) | Admitting: Family Medicine

## 2023-06-19 ENCOUNTER — Encounter (INDEPENDENT_AMBULATORY_CARE_PROVIDER_SITE_OTHER): Payer: Self-pay

## 2023-07-22 ENCOUNTER — Encounter: Payer: Self-pay | Admitting: Family Medicine

## 2023-08-01 ENCOUNTER — Telehealth: Payer: Self-pay

## 2023-08-01 ENCOUNTER — Encounter

## 2023-08-01 NOTE — Telephone Encounter (Signed)
 Unable to reach pt for PV APT. VM left pt to call and r/s by 5 PM to avoid cancellation of her colonoscopy with Dr. Lavon Paganini.

## 2023-08-25 ENCOUNTER — Telehealth (INDEPENDENT_AMBULATORY_CARE_PROVIDER_SITE_OTHER): Admitting: Family Medicine

## 2023-08-25 DIAGNOSIS — N951 Menopausal and female climacteric states: Secondary | ICD-10-CM | POA: Insufficient documentation

## 2023-08-25 NOTE — Progress Notes (Signed)
 Virtual telephone visit    Virtual Visit via Telephone Note   Due to her co-morbid illnesses, this patient is at least at moderate risk for complications without adequate follow up. This format is felt to be most appropriate for this patient at this time. The patient did not have access to video technology or had technical difficulties with video requiring transitioning to audio format only (telephone). Physical exam was limited to content and character of the telephone converstion. heather was able to get the pt on video visit.   Patient location: home  Patient and provider in visit Provider location: Office  I discussed the limitations of evaluation and management by telemedicine and the availability of in person appointments. The patient expressed understanding and agreed to proceed.   Visit Date: 08/25/2023  Today's healthcare provider: Estill Hemming, DO     Subjective:    Patient ID: Ariana Cohen, female    DOB: 01-16-1961, 63 y.o.   MRN: [id number]  Chief Complaint  Patient presents with   sleep concerns    HPI Patient is in today for insomnia.  Discussed the use of AI scribe software for clinical note transcription with the patient, who gave verbal consent to proceed.  History of Present Illness Ariana Cohen is a 63 year old female with insomnia and sleep apnea who presents with difficulty sleeping and focusing.  She experiences insomnia, characterized by waking up at 1 AM after going to bed at 10 PM, and struggles to fall back asleep until around 3:30 AM, only to wake up again at 4:30 AM. 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 She also tried melatonin about two years ago without significant benefit.  She has a history of sleep apnea and uses a CPAP machine, though it has not been checked recently, and she suspects it may not be set correctly. A home sleep study last year confirmed  her diagnosis of sleep apnea, but she faced issues with insurance not covering a new machine. She has resumed using the machine for the past few months after a period of discontinuation.  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 She acknowledges that her sleep issues may be related to menopause and mentions that she snores.    Past Medical History:  Diagnosis Date   Allergy    Anxiety    Blood transfusion without reported diagnosis 2000   Depression    Fibroids    Heart murmur    Hypertension    Sleep apnea    has cpap doesn't use all the time   Urticaria     Past Surgical History:  Procedure Laterality Date   BREAST BIOPSY  2019   CESAREAN SECTION  1998; 2000   MYOMECTOMY  1992    Family History  Problem Relation Age of Onset   Hypertension Mother    Renal Disease Mother    Schizophrenia Mother    Dementia Mother    Hypertension Father    Heart disease Father        also OSA   Hypertension Sister    Hypertension Maternal Grandmother    Heart attack Maternal Grandmother    Thyroid disease Maternal Grandmother        thyroidectomy   Hypertension Maternal Grandfather    Multiple myeloma Maternal Grandfather    Sleep apnea  Maternal Grandfather    Asthma Daughter    Healthy Daughter    Healthy Son    Colon cancer Neg Hx    Esophageal cancer Neg Hx    Stomach cancer Neg Hx    Rectal cancer Neg Hx     Social History   Socioeconomic History   Marital status: Single    Spouse name: Not on file   Number of children: Not on file   Years of education: Not on file   Highest education level: Not on file  Occupational History    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   Occupation: [employer]    Comment: [place]  Tobacco Use   Smoking status: Never   Smokeless tobacco: Never  Vaping Use   Vaping status: Never Used  Substance and Sexual Activity   Alcohol use: Yes    Comment:  rarely   Drug use: No   Sexual activity: Yes  Other Topics Concern   Not on file  Social History Narrative   epworth sleepiness scale = 18 (08/11/15)   Social Drivers of Corporate investment banker Strain: Not on file  Food Insecurity: Not on file  Transportation Needs: Not on file  Physical Activity: Not on file  Stress: Not on file  Social Connections: Not on file  Intimate Partner Violence: Not on file    Outpatient Medications Prior to Visit  Medication Sig Dispense Refill   busPIRone  (BUSPAR ) 7.5 MG tablet Take 1 tablet (7.5 mg total) by mouth 2 (two) times daily. 180 tablet 0   fexofenadine (ALLEGRA) 60 MG tablet Take by mouth.     FLUoxetine  (PROZAC ) 20 MG capsule Take 1 capsule (20 mg total) by mouth daily. 90 capsule 0   naproxen (NAPROSYN) 500 MG tablet Take by mouth.     rosuvastatin  (CRESTOR ) 10 MG tablet TAKE 1 TABLET BY MOUTH EVERY DAY 90 tablet 3   solifenacin  (VESICARE ) 5 MG tablet TAKE 1 TABLET (5 MG TOTAL) BY MOUTH DAILY. 90 tablet 1   spironolactone  (ALDACTONE ) 50 MG tablet TAKE 1 TABLET BY MOUTH EVERY DAY 90 tablet 3   telmisartan -hydrochlorothiazide (MICARDIS  HCT) 80-12.5 MG tablet TAKE 1 TABLET BY MOUTH EVERY DAY 90 tablet 3   No facility-administered medications prior to visit.    Allergies  Allergen Reactions   Norvasc [Amlodipine Besylate] Swelling   Aspirin Hives   Colchicine      Diarrhea    Metoprolol Other (See Comments)    Fatigue, somnolence    Minoxidil  Other (See Comments)    Weight gain, extreme edema  Legs, feet, hands, lethargic     Other Other (See Comments)    Review of Systems  Constitutional:  Negative for fever and malaise/fatigue.  HENT:  Negative for congestion.   Eyes:  Negative for blurred vision.  Respiratory:  Negative for cough and shortness of breath.   Cardiovascular:  Negative for chest pain, palpitations and leg swelling.  Gastrointestinal:  Negative for abdominal pain, blood in stool, nausea and vomiting.   Genitourinary:  Negative for dysuria and frequency.  Musculoskeletal:  Negative for back pain and falls.  Skin:  Negative for rash.  Neurological:  Negative for dizziness, loss of consciousness and headaches.  Endo/Heme/Allergies:  Negative for environmental allergies.  Psychiatric/Behavioral:  Negative for depression. The patient is not nervous/anxious.        Objective:    Physical Exam Vitals and nursing note reviewed.  Constitutional:      General: She is not in acute distress.  Appearance: Normal appearance. She is well-developed.  HENT:     Head: Normocephalic and atraumatic.  Eyes:     General: No scleral icterus.       Right eye: No discharge.        Left eye: No discharge.  Cardiovascular:     Heart sounds: No murmur heard. Pulmonary:     Effort: Pulmonary effort is normal.  Skin:    General: Skin is warm and dry.  Neurological:     Mental Status: She is alert and oriented to person, place, and time.  Psychiatric:        Mood and Affect: Mood normal.        Behavior: Behavior normal.        Thought Content: Thought content normal.        Judgment: Judgment normal.     LMP 06/20/2013  Wt Readings from Last 3 Encounters:  05/16/23 212 lb 6.4 oz (96.3 kg)  12/23/22 210 lb (95.3 kg)  11/22/22 207 lb (93.9 kg)       Assessment & Plan:  Insomnia associated with menopause  Assessment and Plan Assessment & Plan Sleep apnea   She uses a CPAP machine for sleep apnea, but its current functionality may be suboptimal, contributing to insomnia and daytime focus issues. Inadequate CPAP function could lead to frequent awakenings due to insufficient oxygenation during sleep. There are recent insurance coverage issues for obtaining a new machine. Contact Dr. Charl Concha office to check the CPAP machine's settings and functionality. Investigate insurance coverage for a new CPAP machine and refile the claim if necessary. If the CPAP machine functions properly, consider  further treatment for insomnia. Ensure consistent CPAP use to improve sleep quality and daytime focus.  Insomnia   She experiences chronic insomnia with difficulty maintaining sleep, potentially related to menopause or sleep apnea. She wakes at 1 AM and struggles to return to sleep. Previous use of Unisom caused excessive daytime sleepiness, and melatonin had limited success. Concerns about Unisom include oversleeping and daytime grogginess. Try Z-Quil with melatonin, lavender, and chamomile as a sleep aid. Avoid Unisom due to excessive daytime sleepiness unless taken early to prevent morning grogginess. Monitor sleep patterns and report any changes or persistent issues.    I discussed the assessment and treatment plan with the patient. The patient was provided an opportunity to ask questions and all were answered. The patient agreed with the plan and demonstrated an understanding of the instructions.   The patient was advised to call back or seek an in-person evaluation if the symptoms worsen or if the condition fails to improve as anticipated.     Estill Hemming, DO [facility] [phone] (phone) [phone] (fax)  [facility]

## 2023-08-29 ENCOUNTER — Encounter: Payer: Self-pay | Admitting: Gastroenterology

## 2023-09-01 ENCOUNTER — Telehealth: Payer: Self-pay | Admitting: Cardiology

## 2023-09-01 DIAGNOSIS — I10 Essential (primary) hypertension: Secondary | ICD-10-CM

## 2023-09-01 DIAGNOSIS — G4733 Obstructive sleep apnea (adult) (pediatric): Secondary | ICD-10-CM

## 2023-09-01 NOTE — Telephone Encounter (Signed)
 Patient calling in regarding sleep she has some question/concerns. Please advise

## 2023-09-02 ENCOUNTER — Telehealth

## 2023-09-02 ENCOUNTER — Ambulatory Visit (AMBULATORY_SURGERY_CENTER)

## 2023-09-02 ENCOUNTER — Encounter: Admitting: Gastroenterology

## 2023-09-02 VITALS — Ht 66.0 in | Wt 203.0 lb

## 2023-09-02 DIAGNOSIS — Z860100 Personal history of colon polyps, unspecified: Secondary | ICD-10-CM

## 2023-09-02 MED ORDER — NA SULFATE-K SULFATE-MG SULF 17.5-3.13-1.6 GM/177ML PO SOLN: 1.0000 | Freq: Once | ORAL | 0 refills | Status: AC

## 2023-09-02 NOTE — Progress Notes (Signed)
 No egg or soy allergy known to patient  No issues known to pt with past sedation with any surgeries or procedures Patient denies ever being told they had issues or difficulty with intubation  No FH of Malignant Hyperthermia Pt is not on diet pills Pt is not on  home 02  OSA using CPAP Pt is not on blood thinners  Pt denies issues with constipation  No A fib or A flutter.  Have any cardiac testing pending-- no  LOA: independent  Prep: suprep    PV completed with patient. Prep instructions sent via mychart and home address.

## 2023-09-05 NOTE — Telephone Encounter (Signed)
 Returned call pt is agreeable to treatment.

## 2023-09-05 NOTE — Addendum Note (Signed)
 Addended by: Joslyn Nim on: 09/05/2023 03:38 PM   Modules accepted: Orders

## 2023-09-09 ENCOUNTER — Encounter: Payer: Self-pay | Admitting: Gastroenterology

## 2023-09-12 ENCOUNTER — Encounter (HOSPITAL_COMMUNITY): Payer: Self-pay

## 2023-09-15 ENCOUNTER — Encounter: Payer: Managed Care, Other (non HMO) | Admitting: Family Medicine

## 2023-09-19 ENCOUNTER — Encounter: Payer: Self-pay | Admitting: Gastroenterology

## 2023-09-19 ENCOUNTER — Ambulatory Visit: Admitting: Gastroenterology

## 2023-09-19 VITALS — BP 137/83 | HR 60 | Temp 98.1°F | Resp 9 | Ht 66.0 in | Wt 200.0 lb

## 2023-09-19 DIAGNOSIS — D125 Benign neoplasm of sigmoid colon: Secondary | ICD-10-CM

## 2023-09-19 DIAGNOSIS — K635 Polyp of colon: Secondary | ICD-10-CM | POA: Diagnosis not present

## 2023-09-19 DIAGNOSIS — K621 Rectal polyp: Secondary | ICD-10-CM | POA: Diagnosis not present

## 2023-09-19 DIAGNOSIS — Z860101 Personal history of adenomatous and serrated colon polyps: Secondary | ICD-10-CM

## 2023-09-19 DIAGNOSIS — D120 Benign neoplasm of cecum: Secondary | ICD-10-CM

## 2023-09-19 DIAGNOSIS — Z1211 Encounter for screening for malignant neoplasm of colon: Secondary | ICD-10-CM

## 2023-09-19 DIAGNOSIS — D123 Benign neoplasm of transverse colon: Secondary | ICD-10-CM

## 2023-09-19 DIAGNOSIS — D122 Benign neoplasm of ascending colon: Secondary | ICD-10-CM

## 2023-09-19 DIAGNOSIS — Z860100 Personal history of colon polyps, unspecified: Secondary | ICD-10-CM

## 2023-09-19 DIAGNOSIS — D128 Benign neoplasm of rectum: Secondary | ICD-10-CM

## 2023-09-19 MED ORDER — SODIUM CHLORIDE 0.9 % IV SOLN: 500.0000 mL | Freq: Once | INTRAVENOUS | Status: DC

## 2023-09-19 NOTE — Progress Notes (Signed)
 To pacu, VSS. Report to [email or web address].tb

## 2023-09-19 NOTE — Progress Notes (Signed)
 Called to room to assist during endoscopic procedure.  Patient ID and intended procedure confirmed with present staff. Received instructions for my participation in the procedure from the performing physician.

## 2023-09-19 NOTE — Patient Instructions (Addendum)
  Resume previous diet Continue present medications Await pathology results Repeat colonoscopy in 1 year for surveillance based on pathology results.    YOU HAD AN ENDOSCOPIC PROCEDURE TODAY AT THE [facility]:   Refer to the procedure report that was given to you for any specific questions about what was found during the examination.  If the procedure report does not answer your questions, please call your gastroenterologist to clarify.  If you requested that your care partner not be given the details of your procedure findings, then the procedure report has been included in a sealed envelope for you to review at your convenience later.  YOU SHOULD EXPECT: Some feelings of bloating in the abdomen. Passage of more gas than usual.  Walking can help get rid of the air that was put into your GI tract during the procedure and reduce the bloating. If you had a lower endoscopy (such as a colonoscopy or flexible sigmoidoscopy) you may notice spotting of blood in your stool or on the toilet paper. If you underwent a bowel prep for your procedure, you may not have a normal bowel movement for a few days.  Please Note:  You might notice some irritation and congestion in your nose or some drainage.  This is from the oxygen used during your procedure.  There is no need for concern and it should clear up in a day or so.  SYMPTOMS TO REPORT IMMEDIATELY:  Following lower endoscopy (colonoscopy or flexible sigmoidoscopy):  Excessive amounts of blood in the stool  Significant tenderness or worsening of abdominal pains  Swelling of the abdomen that is new, acute  Fever of 100F or higher    For urgent or emergent issues, a gastroenterologist can be reached at any hour by calling [phone]. Do not use MyChart messaging for urgent concerns.    DIET:  We do recommend a small meal at first, but then you may proceed to your regular diet.  Drink plenty of fluids but you should avoid alcoholic  beverages for 24 hours.  ACTIVITY:  You should plan to take it easy for the rest of today and you should NOT DRIVE or use heavy machinery until tomorrow (because of the sedation medicines used during the test).    FOLLOW UP: Our staff will call the number listed on your records the next business day following your procedure.  We will call around 7:15- 8:00 am to check on you and address any questions or concerns that you may have regarding the information given to you following your procedure. If we do not reach you, we will leave a message.     If any biopsies were taken you will be contacted by phone or by letter within the next 1-3 weeks.  Please call us  at [phone] if you have not heard about the biopsies in 3 weeks.    SIGNATURES/CONFIDENTIALITY: You and/or your care partner have signed paperwork which will be entered into your electronic medical record.  These signatures attest to the fact that that the information above on your After Visit Summary has been reviewed and is understood.  Full responsibility of the confidentiality of this discharge information lies with you and/or your care-partner.

## 2023-09-19 NOTE — Op Note (Signed)
 [facility] Patient Name: Lenetta Gunnoe Procedure Date: 09/19/2023 2:52 PM MRN: [id number] Endoscopist: Sergio Dandy , MD, [id number] Age: 63 Referring MD:  Date of Birth: 24-Mar-1961 Gender: Female Account #: [id number] Procedure:                Colonoscopy Indications:              High risk colon cancer surveillance: Personal                            history of colonic polyps, High risk colon cancer                            surveillance: Personal history of multiple (3 or                            more) adenomas, High risk colon cancer                            surveillance: Personal history of sessile serrated                            colon polyp (10 mm or greater in size) Medicines:                Monitored Anesthesia Care Procedure:                Pre-Anesthesia Assessment:                           - Prior to the procedure, a History and Physical                            was performed, and patient medications and                            allergies were reviewed. The patient's tolerance of                            previous anesthesia was also reviewed. The risks                            and benefits of the procedure and the sedation                            options and risks were discussed with the patient.                            All questions were answered, and informed consent                            was obtained. Prior Anticoagulants: The patient has                            taken no anticoagulant or antiplatelet agents. ASA  Grade Assessment: II - A patient with mild systemic                            disease. After reviewing the risks and benefits,                            the patient was deemed in satisfactory condition to                            undergo the procedure.                           After obtaining informed consent, the colonoscope                            was passed under direct  vision. Throughout the                            procedure, the patient's blood pressure, pulse, and                            oxygen saturations were monitored continuously. The                            Olympus Scope SN: [id number] was introduced through                            the anus and advanced to the the cecum, identified                            by appendiceal orifice and ileocecal valve. The                            colonoscopy was performed without difficulty. The                            patient tolerated the procedure well. The quality                            of the bowel preparation was good. The ileocecal                            valve, appendiceal orifice, and rectum were                            photographed. Scope In: 3:08:23 PM Scope Out: 3:38:10 PM Scope Withdrawal Time: 0 hours 22 minutes 31 seconds  Total Procedure Duration: 0 hours 29 minutes 47 seconds  Findings:                 The perianal and digital rectal examinations were                            normal.  Two sessile polyps were found in the ascending                            colon and cecum. The polyps were 1 to 2 mm in size.                            These polyps were removed with a cold biopsy                            forceps. Resection and retrieval were complete.                           Six sessile polyps were found in the transverse                            colon and ascending colon. The polyps were 4 to 7                            mm in size. These polyps were removed with a cold                            snare. Resection and retrieval were complete.                           Seven sessile polyps were found in the rectum and                            sigmoid colon. The polyps were 5 to 11 mm in size.                            These polyps were removed with a cold snare.                            Resection and retrieval were complete.                            Non-bleeding external and internal hemorrhoids were                            found during retroflexion. The hemorrhoids were                            medium-sized. Complications:            No immediate complications. Estimated Blood Loss:     Estimated blood loss was minimal. Impression:               - Two 1 to 2 mm polyps in the ascending colon and                            in the cecum, removed with a cold biopsy forceps.  Resected and retrieved.                           - Six 4 to 7 mm polyps in the transverse colon and                            in the ascending colon, removed with a cold snare.                            Resected and retrieved.                           - Seven 5 to 11 mm polyps in the rectum and in the                            sigmoid colon, removed with a cold snare. Resected                            and retrieved.                           - Non-bleeding external and internal hemorrhoids. Recommendation:           - Patient has a contact number available for                            emergencies. The signs and symptoms of potential                            delayed complications were discussed with the                            patient. Return to normal activities tomorrow.                            Written discharge instructions were provided to the                            patient.                           - Resume previous diet.                           - Continue present medications.                           - Await pathology results.                           - Repeat colonoscopy in 1 year for surveillance                            based on pathology results. Gagan Dillion V. Sidnie Swalley, MD 09/19/2023 3:52:26 PM This report has been signed electronically.

## 2023-09-19 NOTE — Progress Notes (Signed)
 Pt's states no medical or surgical changes since previsit or office visit.

## 2023-09-19 NOTE — Progress Notes (Signed)
 [facility] History and Physical   Primary Care Physician:  Estill Hemming, DO   Reason for Procedure:  History of adenomatous colon polyps  Plan:    Surveillance colonoscopy with possible interventions as needed     HPI: Ariana Cohen is a very pleasant 63 y.o. female here for surveillance colonoscopy. Denies any nausea, vomiting, abdominal pain, melena or bright red blood per rectum  The risks and benefits as well as alternatives of endoscopic procedure(s) have been discussed and reviewed. All questions answered. The patient agrees to proceed.    Past Medical History:  Diagnosis Date   Allergy    Anxiety    Blood transfusion without reported diagnosis 2000   Depression    Fibroids    Heart murmur    Hypertension    Sleep apnea    has cpap doesn't use all the time   Urticaria     Past Surgical History:  Procedure Laterality Date   BREAST BIOPSY  2019   CESAREAN SECTION  1998; 2000   MYOMECTOMY  1992    Prior to Admission medications   Medication Sig Start Date End Date Taking? Authorizing Provider  fexofenadine (ALLEGRA) 60 MG tablet Take 60 mg by mouth daily as needed.    [provider]  naproxen (NAPROSYN) 500 MG tablet Take 500 mg by mouth as needed. 04/16/18   [provider]  rosuvastatin  (CRESTOR ) 10 MG tablet TAKE 1 TABLET BY MOUTH EVERY DAY 05/06/23   Jacqueline Matsu, MD  spironolactone  (ALDACTONE ) 50 MG tablet TAKE 1 TABLET BY MOUTH EVERY DAY 03/20/23   Von Grumbling, PA-C  telmisartan -hydrochlorothiazide (MICARDIS  HCT) 80-12.5 MG tablet TAKE 1 TABLET BY MOUTH EVERY DAY 05/06/23   Jacqueline Matsu, MD    Current Outpatient Medications  Medication Sig Dispense Refill   fexofenadine (ALLEGRA) 60 MG tablet Take 60 mg by mouth daily as needed.     naproxen (NAPROSYN) 500 MG tablet Take 500 mg by mouth as needed.     rosuvastatin  (CRESTOR ) 10 MG tablet TAKE 1 TABLET BY MOUTH EVERY DAY 90 tablet 3   spironolactone   (ALDACTONE ) 50 MG tablet TAKE 1 TABLET BY MOUTH EVERY DAY 90 tablet 3   telmisartan -hydrochlorothiazide (MICARDIS  HCT) 80-12.5 MG tablet TAKE 1 TABLET BY MOUTH EVERY DAY 90 tablet 3   Current Facility-Administered Medications  Medication Dose Route Frequency Provider Last Rate Last Admin   0.9 %  sodium chloride  infusion  500 mL Intravenous Once Shriyan Arakawa V, MD        Allergies as of 09/19/2023 - Review Complete 09/19/2023  Allergen Reaction Noted   Minoxidil  Other (See Comments) 06/11/2016   Norvasc [amlodipine besylate] Swelling 08/11/2015   Aspirin Hives 07/08/2013   Colchicine  Other (See Comments) 04/01/2018   Metoprolol Other (See Comments) 01/31/2017   Other Other (See Comments) 05/17/2019    Family History  Problem Relation Age of Onset   Hypertension Mother    Renal Disease Mother    Schizophrenia Mother    Dementia Mother    Hypertension Father    Heart disease Father        also OSA   Hypertension Sister    Hypertension Maternal Grandmother    Heart attack Maternal Grandmother    Thyroid disease Maternal Grandmother        thyroidectomy   Hypertension Maternal Grandfather    Multiple myeloma Maternal Grandfather    Sleep apnea Maternal Grandfather    Asthma Daughter  Healthy Daughter    Healthy Son    Colon cancer Neg Hx    Esophageal cancer Neg Hx    Stomach cancer Neg Hx    Rectal cancer Neg Hx     Social History   Socioeconomic History   Marital status: Single    Spouse name: Not on file   Number of children: Not on file   Years of education: Not on file   Highest education level: Not on file  Occupational History    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   Occupation: [employer]    Comment: [place]  Tobacco Use   Smoking status: Never   Smokeless tobacco: Never  Vaping Use   Vaping status: Never Used  Substance and Sexual Activity   Alcohol use: Yes    Comment: rarely   Drug use: No   Sexual activity: Yes  Other Topics Concern   Not  on file  Social History Narrative   epworth sleepiness scale = 18 (08/11/15)   Social Drivers of Corporate investment banker Strain: Not on file  Food Insecurity: Not on file  Transportation Needs: Not on file  Physical Activity: Not on file  Stress: Not on file  Social Connections: Not on file  Intimate Partner Violence: Not on file    Review of Systems:  All other review of systems negative except as mentioned in the HPI.  Physical Exam: Vital signs in last 24 hours: BP 113/69   Pulse (!) 54   Temp 98.1 F (36.7 C)   Ht 5\' 6"  (1.676 m)   Wt 200 lb (90.7 kg)   LMP 06/20/2013   SpO2 95%   BMI 32.28 kg/m  General:   Alert, NAD Lungs:  Clear .   Heart:  Regular rate and rhythm Abdomen:  Soft, nontender and nondistended. Neuro/Psych:  Alert and cooperative. Normal mood and affect. A and O x 3  Reviewed labs, radiology imaging, old records and pertinent past GI work up  Patient is appropriate for planned procedure(s) and anesthesia in an ambulatory setting   K. Veena Kinslea Frances , MD [phone]

## 2023-09-22 ENCOUNTER — Telehealth: Payer: Self-pay | Admitting: Lactation Services

## 2023-09-22 NOTE — Telephone Encounter (Signed)
 Left message on f/u call

## 2023-09-23 LAB — SURGICAL PATHOLOGY

## 2023-10-09 ENCOUNTER — Ambulatory Visit: Admitting: Family Medicine

## 2023-10-09 ENCOUNTER — Telehealth: Admitting: Family Medicine

## 2023-10-09 ENCOUNTER — Encounter: Payer: Self-pay | Admitting: Family Medicine

## 2023-10-09 DIAGNOSIS — G4700 Insomnia, unspecified: Secondary | ICD-10-CM | POA: Diagnosis not present

## 2023-10-09 DIAGNOSIS — F418 Other specified anxiety disorders: Secondary | ICD-10-CM

## 2023-10-09 MED ORDER — BUPROPION HCL ER (XL) 150 MG PO TB24: ORAL_TABLET | ORAL | 0 refills | Status: DC

## 2023-10-09 NOTE — Progress Notes (Signed)
 MyChart Video Visit    Virtual Visit via Video Note   This patient is at least at moderate risk for complications without adequate follow up. This format is felt to be most appropriate for this patient at this time. Physical exam was limited by quality of the video and audio technology used for the visit. Ariana Cohen was able to get the patient set up on a video visit.  Patient location: home  Patient and provider in visit Provider location: Office  I discussed the limitations of evaluation and management by telemedicine and the availability of in person appointments. The patient expressed understanding and agreed to proceed.  Visit Date: 10/09/2023  Today's healthcare provider: Estill Hemming, DO     Subjective:    Patient ID: Ariana Cohen, female    DOB: 05-27-1960, 63 y.o.   MRN: [id number]  Chief Complaint  Patient presents with   Anxiety   Follow-up    HPI Discussed the use of AI scribe software for clinical note transcription with the patient, who gave verbal consent to proceed.  History of Present Illness Ariana Cohen is a 63 year old female with anxiety and depression who presents with anxiety, depression, and focus issues.  She has a history of anxiety and depression. Wellbutrin  was previously effective for improving focus but may have exacerbated her anxiety. She is considering restarting Wellbutrin  as it helped with focus, which she believes might reduce her anxiety. She has tried Prozac  and Zoloft  in the past without success.  She experiences insomnia, which was previously discussed in a virtual call. She followed up regarding her insomnia and CPAP machine. After consulting with the sleep coordinator and having her machine checked at the sleep center two weeks ago, she was informed it was calibrated correctly, leading to some improvement in her sleep. She has tried melatonin gummies without success but finds tea calming if taken consistently before  bed.  She is concerned about the cognitive side effects of sleeping pills, including dementia and hearing loss. Her daughter advised against using Benadryl for sleep due to its potential to cause confusion, especially in the elderly.    Past Medical History:  Diagnosis Date   Allergy    Anxiety    Blood transfusion without reported diagnosis 2000   Depression    Fibroids    Heart murmur    Hypertension    Sleep apnea    has cpap doesn't use all the time   Urticaria     Past Surgical History:  Procedure Laterality Date   BREAST BIOPSY  2019   CESAREAN SECTION  1998; 2000   MYOMECTOMY  1992    Family History  Problem Relation Age of Onset   Hypertension Mother    Renal Disease Mother    Schizophrenia Mother    Dementia Mother    Hypertension Father    Heart disease Father        also OSA   Hypertension Sister    Hypertension Maternal Grandmother    Heart attack Maternal Grandmother    Thyroid disease Maternal Grandmother        thyroidectomy   Hypertension Maternal Grandfather    Multiple myeloma Maternal Grandfather    Sleep apnea Maternal Grandfather    Asthma Daughter    Healthy Daughter    Healthy Son    Colon cancer Neg Hx    Esophageal cancer Neg Hx    Stomach cancer Neg Hx    Rectal cancer Neg  Hx     Social History   Socioeconomic History   Marital status: Single    Spouse name: Not on file   Number of children: Not on file   Years of education: Not on file   Highest education level: Not on file  Occupational History    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   Occupation: [employer]    Comment: [place]  Tobacco Use   Smoking status: Never   Smokeless tobacco: Never  Vaping Use   Vaping status: Never Used  Substance and Sexual Activity   Alcohol use: Yes    Comment: rarely   Drug use: No   Sexual activity: Yes  Other Topics Concern   Not on file  Social History Narrative   epworth sleepiness scale = 18 (08/11/15)   Social Drivers of Manufacturing engineer Strain: Not on file  Food Insecurity: Not on file  Transportation Needs: Not on file  Physical Activity: Not on file  Stress: Not on file  Social Connections: Not on file  Intimate Partner Violence: Not on file    Outpatient Medications Prior to Visit  Medication Sig Dispense Refill   fexofenadine (ALLEGRA) 60 MG tablet Take 60 mg by mouth daily as needed.     naproxen (NAPROSYN) 500 MG tablet Take 500 mg by mouth as needed.     rosuvastatin  (CRESTOR ) 10 MG tablet TAKE 1 TABLET BY MOUTH EVERY DAY 90 tablet 3   spironolactone  (ALDACTONE ) 50 MG tablet TAKE 1 TABLET BY MOUTH EVERY DAY 90 tablet 3   telmisartan -hydrochlorothiazide (MICARDIS  HCT) 80-12.5 MG tablet TAKE 1 TABLET BY MOUTH EVERY DAY 90 tablet 3   No facility-administered medications prior to visit.    Allergies  Allergen Reactions   Minoxidil  Other (See Comments)    Weight gain, extreme edema  Legs, feet, hands, lethargic     Norvasc [Amlodipine Besylate] Swelling   Aspirin Hives   Colchicine  Other (See Comments)    Diarrhea    Metoprolol Other (See Comments)    Fatigue, somnolence    Other Other (See Comments)    Review of Systems  Constitutional:  Negative for chills, fever and malaise/fatigue.  HENT:  Negative for congestion and hearing loss.   Eyes:  Negative for blurred vision and discharge.  Respiratory:  Negative for cough, sputum production and shortness of breath.   Cardiovascular:  Negative for chest pain, palpitations and leg swelling.  Gastrointestinal:  Negative for abdominal pain, blood in stool, constipation, diarrhea, heartburn, nausea and vomiting.  Genitourinary:  Negative for dysuria, frequency, hematuria and urgency.  Musculoskeletal:  Negative for back pain, falls and myalgias.  Skin:  Negative for rash.  Neurological:  Negative for dizziness, sensory change, loss of consciousness, weakness and headaches.  Endo/Heme/Allergies:  Negative for environmental allergies.  Does not bruise/bleed easily.  Psychiatric/Behavioral:  Positive for depression. Negative for hallucinations, memory loss, substance abuse and suicidal ideas. The patient is nervous/anxious and has insomnia.        Objective:     Physical Exam Vitals and nursing note reviewed.  Constitutional:      Appearance: Normal appearance.  Neurological:     General: No focal deficit present.     Mental Status: She is alert and oriented to person, place, and time.  Psychiatric:        Mood and Affect: Mood normal.        Behavior: Behavior normal.        Thought Content: Thought content normal.  Judgment: Judgment normal.     LMP 06/20/2013  Wt Readings from Last 3 Encounters:  09/19/23 200 lb (90.7 kg)  09/02/23 203 lb (92.1 kg)  05/16/23 212 lb 6.4 oz (96.3 kg)       Assessment & Plan:  Depression with anxiety -     buPROPion  HCl ER (XL); 1 po qam x 8 days then 2 po qam  Dispense: 60 tablet; Refill: 0   Assessment and Plan Assessment & Plan Anxiety and Depression   She manages anxiety and depression with Wellbutrin , which previously aided focus but may worsen anxiety. Personal issues contribute to her symptoms, and alternatives like Prozac  and Zoloft  were ineffective. Initiate Wellbutrin  at 150 mg once daily in the morning for 8 days, then increase to 300 mg once daily. Monitor for exacerbation of anxiety symptoms. Educate her to take Wellbutrin  in the morning to avoid insomnia. Follow up in one month to assess the effectiveness and tolerability of Wellbutrin .  Insomnia   Insomnia may be related to menopausal symptoms or CPAP issues. CPAP calibration improved sleep. Melatonin and tea were tried, with tea providing a calming effect. Concern exists about sleeping pills contributing to dementia. Encourage the use of calming tea as part of a bedtime routine, especially on nights before work. Avoid Benadryl for sleep due to potential adverse effects, particularly in the  elderly.  CPAP Use for Sleep Apnea   She uses a CPAP machine for sleep apnea. Calibration concerns were addressed, confirming correct settings and improving sleep. Continue using the CPAP machine as calibrated.    I discussed the assessment and treatment plan with the patient. The patient was provided an opportunity to ask questions and all were answered. The patient agreed with the plan and demonstrated an understanding of the instructions.   The patient was advised to call back or seek an in-person evaluation if the symptoms worsen or if the condition fails to improve as anticipated.  Estill Hemming, DO [facility] [phone] (phone) [phone] (fax)  [facility]

## 2023-10-22 ENCOUNTER — Ambulatory Visit: Payer: Self-pay | Admitting: Gastroenterology

## 2023-10-27 ENCOUNTER — Encounter: Payer: Self-pay | Admitting: Family Medicine

## 2023-10-31 ENCOUNTER — Other Ambulatory Visit: Payer: Self-pay | Admitting: Family Medicine

## 2023-10-31 DIAGNOSIS — F418 Other specified anxiety disorders: Secondary | ICD-10-CM

## 2023-11-20 ENCOUNTER — Telehealth: Payer: Self-pay | Admitting: Gastroenterology

## 2023-11-20 NOTE — Telephone Encounter (Signed)
 Inbound call from patient stated she would like to speak to nurse in regards to recently having a colonoscopy on 09/19/23 and has been experiencing constipation and abd pain Requesting a call back  Please advise  Thank you

## 2023-11-20 NOTE — Telephone Encounter (Signed)
 Spoke with patient. She reports that since her colonoscopy she has noticed a change in her bowel habits. She reports that she is struggling with constipation. She states she has been having to use stool softeners and when she goes it is hard for her to pass it. Recommended patient try Miralax. Mixing 1 capful into 8 ounces of water daily. Encouraged patient to drink plenty of hydrating liquids to help pull water into the bowels to make it easier to pass. Recommended patient try this and call with updates in a couple of days. Patient verbalized understanding.

## 2023-11-21 ENCOUNTER — Other Ambulatory Visit: Payer: Self-pay | Admitting: Family Medicine

## 2023-11-21 DIAGNOSIS — F411 Generalized anxiety disorder: Secondary | ICD-10-CM

## 2023-11-25 ENCOUNTER — Ambulatory Visit: Admitting: Family Medicine

## 2023-11-25 ENCOUNTER — Encounter: Payer: Self-pay | Admitting: Family Medicine

## 2023-11-25 VITALS — BP 124/78 | HR 73 | Temp 98.1°F | Resp 18 | Ht 66.0 in | Wt 203.8 lb

## 2023-11-25 DIAGNOSIS — M940 Chondrocostal junction syndrome [Tietze]: Secondary | ICD-10-CM | POA: Insufficient documentation

## 2023-11-25 DIAGNOSIS — R0789 Other chest pain: Secondary | ICD-10-CM | POA: Diagnosis not present

## 2023-11-25 MED ORDER — MELOXICAM 15 MG PO TABS: 15.0000 mg | ORAL_TABLET | Freq: Every day | ORAL | 0 refills | Status: DC

## 2023-11-25 NOTE — Progress Notes (Signed)
 Established Patient Office Visit  Subjective   Patient ID: Ariana Cohen, female    DOB: April 22, 1961  Age: 63 y.o. MRN: [id number]  Chief Complaint  Patient presents with   Chest Pain    Sxs started over the weekend. Pt states feeling a tugging sensation when she turned on her right side and states she can feel the tug when she leans over. No sob, dizziness    HPI Discussed the use of AI scribe software for clinical note transcription with the patient, who gave verbal consent to proceed.  History of Present Illness Ariana Cohen is a 63 year old female who presents with right-sided chest pain and difficulty sleeping on her right side.  She experiences a pulling sensation on her right side when turning in bed, which prevents her from sleeping on that side due to pain. A tugging sensation is also felt when leaning over to pick something up from the ground. She has not taken any medication for the pain and is unsure if the sensation persists on the same side or has shifted to the left. No shortness of breath, chest pain, or palpitations.  She has a history of a lump removal in the same area and is trying to schedule a follow-up mammogram with the same nurse practitioner she saw last year.  She is currently taking Wellbutrin  for anxiety, which she started on October 31, 2023. Increasing the dose made her extremely sleepy, contrary to her previous experience with the medication. She continues to take the regular dose and notes a slight improvement in her anxiety symptoms. She also mentions a past issue with itching on her face, which has since resolved.   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 tophus 02/27/2018   Hyperuricemia 01/21/2018   Rash 01/21/2018   Great toe pain, left 10/07/2017   Gout involving toe of left foot 08/25/2017   Transient alteration of awareness 10/01/2016   Memory loss 10/01/2016   Obesity (BMI 30.0-34.9) 08/12/2016   Onychomycosis 08/12/2016   Acute reaction to situational stress 07/01/2016   GAD (generalized anxiety disorder) 06/26/2016   Sleep apnea 06/12/2016   Snoring 06/11/2016   Other fatigue 03/26/2016   Preventative health care 11/06/2015   Excessive daytime sleepiness 11/06/2015   Murmur 08/11/2015   PVCs (premature ventricular contractions) 08/11/2015   Shortness of breath 08/11/2015   Primary hypertension 08/11/2015   Past Medical History:  Diagnosis Date   Allergy    Anxiety    Blood transfusion without reported diagnosis 2000   Depression    Fibroids    Heart murmur    Hypertension    Sleep apnea    has cpap doesn't use all the time   Urticaria    Past Surgical History:  Procedure Laterality Date   BREAST BIOPSY  2019   CESAREAN SECTION  1998; 2000   MYOMECTOMY  1992   Social History   Tobacco Use   Smoking status: Never   Smokeless tobacco: Never  Vaping Use   Vaping status: Never  Used  Substance Use Topics   Alcohol use: Yes    Comment: rarely   Drug use: No   Social History   Socioeconomic History   Marital status: Single    Spouse name: Not on file   Number of children: Not on file   Years of education: Not on file   Highest education level: Not on file  Occupational History    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   Occupation: [employer]    Comment: [place]  Tobacco Use   Smoking status: Never   Smokeless tobacco: Never  Vaping Use   Vaping status: Never Used  Substance and Sexual Activity   Alcohol use: Yes    Comment: rarely   Drug use: No    Sexual activity: Yes  Other Topics Concern   Not on file  Social History Narrative   epworth sleepiness scale = 18 (08/11/15)   Social Drivers of Corporate investment banker Strain: Not on file  Food Insecurity: Not on file  Transportation Needs: Not on file  Physical Activity: Not on file  Stress: Not on file  Social Connections: Not on file  Intimate Partner Violence: Not on file   Family Status  Relation Name Status   Mother  Deceased at age 21   Father  Deceased   Sister  Alive   Brother  Alive   MGM  Deceased   MGF  Deceased   Daughter  Alive   Son  Alive   Neg Hx  (Not Specified)  No partnership data on file   Family History  Problem Relation Age of Onset   Hypertension Mother    Renal Disease Mother    Schizophrenia Mother    Dementia Mother    Hypertension Father    Heart disease Father        also OSA   Hypertension Sister    Hypertension Maternal Grandmother    Heart attack Maternal Grandmother    Thyroid disease Maternal Grandmother        thyroidectomy   Hypertension Maternal Grandfather    Multiple myeloma Maternal Grandfather    Sleep apnea Maternal Grandfather    Asthma Daughter    Healthy Daughter    Healthy Son    Colon cancer Neg Hx    Esophageal cancer Neg Hx    Stomach cancer Neg Hx    Rectal cancer Neg Hx    Allergies  Allergen Reactions   Minoxidil  Other (See Comments)    Weight gain, extreme edema  Legs, feet, hands, lethargic     Norvasc [Amlodipine Besylate] Swelling   Aspirin Hives   Colchicine  Other (See Comments)    Diarrhea    Metoprolol Other (See Comments)    Fatigue, somnolence    Other Other (See Comments)      Review of Systems  Constitutional:  Negative for chills, fever and malaise/fatigue.  HENT:  Negative for congestion and hearing loss.   Eyes:  Negative for blurred vision and discharge.  Respiratory:  Negative for cough, sputum production and shortness of breath.   Cardiovascular:  Negative for chest  pain, palpitations and leg swelling.  Gastrointestinal:  Negative for abdominal pain, blood in stool, constipation, diarrhea, heartburn, nausea and vomiting.  Genitourinary:  Negative for dysuria, frequency, hematuria and urgency.  Musculoskeletal:  Negative for back pain, falls and myalgias.  Skin:  Negative for rash.  Neurological:  Negative for dizziness, sensory change, loss of consciousness, weakness and headaches.  Endo/Heme/Allergies:  Negative for  environmental allergies. Does not bruise/bleed easily.  Psychiatric/Behavioral:  Negative for depression and suicidal ideas. The patient is not nervous/anxious and does not have insomnia.       Objective:     BP 124/78 (BP Location: Right Arm, Patient Position: Sitting, Cuff Size: Normal)   Pulse 73   Temp 98.1 F (36.7 C) (Oral)   Resp 18   Ht 5' 6 (1.676 m)   Wt 203 lb 12.8 oz (92.4 kg)   LMP 06/20/2013   SpO2 97%   BMI 32.89 kg/m  BP Readings from Last 3 Encounters:  11/25/23 124/78  09/19/23 137/83  05/16/23 108/88   Wt Readings from Last 3 Encounters:  11/25/23 203 lb 12.8 oz (92.4 kg)  09/19/23 200 lb (90.7 kg)  09/02/23 203 lb (92.1 kg)   SpO2 Readings from Last 3 Encounters:  11/25/23 97%  09/19/23 (!) 58%  05/16/23 95%      Physical Exam Vitals and nursing note reviewed.  Constitutional:      General: She is not in acute distress.    Appearance: Normal appearance. She is well-developed.  HENT:     Head: Normocephalic and atraumatic.  Eyes:     General: No scleral icterus.       Right eye: No discharge.        Left eye: No discharge.  Cardiovascular:     Rate and Rhythm: Normal rate and regular rhythm.     Heart sounds: No murmur heard. Pulmonary:     Effort: Pulmonary effort is normal. No respiratory distress.     Breath sounds: Normal breath sounds.  Musculoskeletal:        General: Normal range of motion.     Cervical back: Normal range of motion and neck supple.     Right lower leg: No  edema.     Left lower leg: No edema.  Skin:    General: Skin is warm and dry.  Neurological:     Mental Status: She is alert and oriented to person, place, and time.  Psychiatric:        Mood and Affect: Mood normal.        Behavior: Behavior normal.        Thought Content: Thought content normal.        Judgment: Judgment normal.      No results found for any visits on 11/25/23.  Last CBC Lab Results  Component Value Date   WBC 6.7 05/16/2023   HGB 14.5 05/16/2023   HCT 45.3 05/16/2023   MCV 85.7 05/16/2023   MCH 27.4 08/31/2021   RDW 14.5 05/16/2023   PLT 341.0 05/16/2023   Last metabolic panel Lab Results  Component Value Date   GLUCOSE 101 (H) 05/16/2023   NA 140 05/16/2023   K 4.0 05/16/2023   CL 102 05/16/2023   CO2 29 05/16/2023   BUN 16 05/16/2023   CREATININE 0.73 05/16/2023   GFR 88.00 05/16/2023   CALCIUM  9.4 05/16/2023   PROT 7.1 05/16/2023   ALBUMIN 4.2 05/16/2023   BILITOT 0.7 05/16/2023   ALKPHOS 88 05/16/2023   AST 25 05/16/2023   ALT 31 05/16/2023   ANIONGAP 8 02/03/2021   Last lipids Lab Results  Component Value Date   CHOL 149 05/16/2023   HDL 48.10 05/16/2023   LDLCALC 88 05/16/2023   TRIG 64.0 05/16/2023   CHOLHDL 3 05/16/2023   Last hemoglobin A1c Lab Results  Component Value Date   HGBA1C 6.6 (H) 05/16/2023  Last thyroid functions Lab Results  Component Value Date   TSH 1.13 05/16/2023   THYROIDAB <1 10/31/2016   Last vitamin D  Lab Results  Component Value Date   VD25OH 55.27 05/16/2023   Last vitamin B12 and Folate Lab Results  Component Value Date   VITAMINB12 248 05/16/2023      The 10-year ASCVD risk score (Arnett DK, et al., 2019) is: 6.1%    Assessment & Plan:   Problem List Items Addressed This Visit       Unprioritized   Other chest pain   Relevant Medications   meloxicam  (MOBIC ) 15 MG tablet   Other Relevant Orders   EKG 12-Lead (Completed)   Costochondritis, acute - Primary   Meloxicam  ,  ice/ heat  EKG nsr nml RR6 Return to office if pain persists or worsens or go to ER       No follow-ups on file.    Emelda Kohlbeck R Lowne Chase, DO

## 2023-11-25 NOTE — Assessment & Plan Note (Addendum)
 Meloxicam  , ice/ heat  EKG nsr nml RR6 Return to office if pain persists or worsens or go to ER

## 2023-12-22 ENCOUNTER — Other Ambulatory Visit: Payer: Self-pay | Admitting: Family Medicine

## 2023-12-22 DIAGNOSIS — R0789 Other chest pain: Secondary | ICD-10-CM

## 2024-05-10 ENCOUNTER — Ambulatory Visit: Attending: Cardiovascular Disease | Admitting: Cardiovascular Disease

## 2024-05-11 ENCOUNTER — Encounter: Payer: Self-pay | Admitting: Cardiovascular Disease

## 2024-05-28 ENCOUNTER — Other Ambulatory Visit: Payer: Self-pay | Admitting: Cardiology

## 2024-06-02 ENCOUNTER — Other Ambulatory Visit: Payer: Self-pay | Admitting: Family Medicine

## 2024-06-02 MED ORDER — TELMISARTAN-HCTZ 80-12.5 MG PO TABS: 1.0000 | ORAL_TABLET | Freq: Every day | ORAL | 3 refills | Status: AC

## 2024-06-02 MED ORDER — SPIRONOLACTONE 50 MG PO TABS: 50.0000 mg | ORAL_TABLET | Freq: Every day | ORAL | 3 refills | Status: AC

## 2024-06-02 NOTE — Telephone Encounter (Signed)
 Copied from CRM #[id number]. Topic: Clinical - Medication Refill >> Jun 02, 2024  9:34 AM Tonda B wrote: Medication: spironolactone  (ALDACTONE ) 50 MG tablet telmisartan -hydrochlorothiazide (MICARDIS  HCT) 80-12.5 MG tablet [  Has the patient contacted their pharmacy? Yes (Agent: If no, request that the patient contact the pharmacy for the refill. If patient does not wish to contact the pharmacy document the reason why and proceed with request.) (Agent: If yes, when and what did the pharmacy advise?)  This is the patient's preferred pharmacy:  [facility] [phone] - [place] - [address] [place] [address] Phone: [phone] Fax: [phone]  Is this the correct pharmacy for this prescription? Yes If no, delete pharmacy and type the correct one.   Has the prescription been filled recently? Yes  Is the patient out of the medication? No  Has the patient been seen for an appointment in the last year OR does the patient have an upcoming appointment? No  Can we respond through MyChart? No  Agent: Please be advised that Rx refills may take up to 3 business days. We ask that you follow-up with your pharmacy.

## 2024-06-03 NOTE — Telephone Encounter (Signed)
 Lipid Completed last on 05/16/23  In accordance with refill protocols, please review and address the following requirements before this medication refill can be authorized:  Labs
